# Patient Record
Sex: Female | Born: 1935
Health system: Southern US, Community
[De-identification: ages and names within clinical notes are randomized; demographics above are authoritative.]

## PROBLEM LIST (undated history)

## (undated) DIAGNOSIS — I6990 Unspecified sequelae of unspecified cerebrovascular disease: Secondary | ICD-10-CM

## (undated) DIAGNOSIS — F419 Anxiety disorder, unspecified: Secondary | ICD-10-CM

## (undated) DIAGNOSIS — E876 Hypokalemia: Secondary | ICD-10-CM

## (undated) DIAGNOSIS — K560 Paralytic ileus: Secondary | ICD-10-CM

## (undated) DIAGNOSIS — E785 Hyperlipidemia, unspecified: Secondary | ICD-10-CM

## (undated) DIAGNOSIS — N390 Urinary tract infection, site not specified: Secondary | ICD-10-CM

## (undated) DIAGNOSIS — I2510 Atherosclerotic heart disease of native coronary artery without angina pectoris: Secondary | ICD-10-CM

## (undated) DIAGNOSIS — D649 Anemia, unspecified: Secondary | ICD-10-CM

## (undated) DIAGNOSIS — E039 Hypothyroidism, unspecified: Secondary | ICD-10-CM

## (undated) DIAGNOSIS — I10 Essential (primary) hypertension: Secondary | ICD-10-CM

## (undated) DIAGNOSIS — D72829 Elevated white blood cell count, unspecified: Secondary | ICD-10-CM

## (undated) DIAGNOSIS — J189 Pneumonia, unspecified organism: Secondary | ICD-10-CM

## (undated) DIAGNOSIS — I219 Acute myocardial infarction, unspecified: Secondary | ICD-10-CM

## (undated) DIAGNOSIS — F329 Major depressive disorder, single episode, unspecified: Secondary | ICD-10-CM

## (undated) DIAGNOSIS — I639 Cerebral infarction, unspecified: Secondary | ICD-10-CM

## (undated) DIAGNOSIS — J90 Pleural effusion, not elsewhere classified: Secondary | ICD-10-CM

## (undated) DIAGNOSIS — J449 Chronic obstructive pulmonary disease, unspecified: Secondary | ICD-10-CM

## (undated) DIAGNOSIS — K219 Gastro-esophageal reflux disease without esophagitis: Secondary | ICD-10-CM

## (undated) DIAGNOSIS — F32A Depression, unspecified: Secondary | ICD-10-CM

## (undated) HISTORY — PX: BACK SURGERY: SHX140

---

## 1998-02-01 ENCOUNTER — Other Ambulatory Visit: Admission: RE | Admit: 1998-02-01 | Discharge: 1998-02-01 | Payer: Self-pay | Admitting: *Deleted

## 1998-07-30 ENCOUNTER — Ambulatory Visit (HOSPITAL_COMMUNITY): Admission: RE | Admit: 1998-07-30 | Discharge: 1998-07-30 | Payer: Self-pay | Admitting: Family Medicine

## 1998-11-01 ENCOUNTER — Encounter: Payer: Self-pay | Admitting: Ophthalmology

## 1998-11-02 ENCOUNTER — Ambulatory Visit (HOSPITAL_COMMUNITY): Admission: RE | Admit: 1998-11-02 | Discharge: 1998-11-02 | Payer: Self-pay | Admitting: Ophthalmology

## 1998-11-02 ENCOUNTER — Observation Stay (HOSPITAL_COMMUNITY): Admission: EM | Admit: 1998-11-02 | Discharge: 1998-11-03 | Payer: Self-pay | Admitting: Ophthalmology

## 1999-08-17 ENCOUNTER — Emergency Department (HOSPITAL_COMMUNITY): Admission: EM | Admit: 1999-08-17 | Discharge: 1999-08-17 | Payer: Self-pay | Admitting: Emergency Medicine

## 1999-08-17 ENCOUNTER — Encounter: Payer: Self-pay | Admitting: Emergency Medicine

## 1999-09-06 ENCOUNTER — Encounter: Payer: Self-pay | Admitting: Family Medicine

## 1999-09-06 ENCOUNTER — Ambulatory Visit (HOSPITAL_COMMUNITY): Admission: RE | Admit: 1999-09-06 | Discharge: 1999-09-06 | Payer: Self-pay | Admitting: Family Medicine

## 1999-11-01 ENCOUNTER — Encounter: Payer: Self-pay | Admitting: Neurosurgery

## 1999-11-05 ENCOUNTER — Encounter: Payer: Self-pay | Admitting: Neurosurgery

## 1999-11-05 ENCOUNTER — Inpatient Hospital Stay (HOSPITAL_COMMUNITY): Admission: RE | Admit: 1999-11-05 | Discharge: 1999-11-13 | Payer: Self-pay | Admitting: Neurosurgery

## 1999-11-13 ENCOUNTER — Inpatient Hospital Stay (HOSPITAL_COMMUNITY)
Admission: RE | Admit: 1999-11-13 | Discharge: 1999-11-21 | Payer: Self-pay | Admitting: Physical Medicine & Rehabilitation

## 1999-12-26 ENCOUNTER — Encounter
Admission: RE | Admit: 1999-12-26 | Discharge: 2000-01-21 | Payer: Self-pay | Admitting: Physical Medicine & Rehabilitation

## 2000-05-21 ENCOUNTER — Other Ambulatory Visit: Admission: RE | Admit: 2000-05-21 | Discharge: 2000-05-21 | Payer: Self-pay | Admitting: Family Medicine

## 2000-07-30 ENCOUNTER — Ambulatory Visit (HOSPITAL_COMMUNITY): Admission: RE | Admit: 2000-07-30 | Discharge: 2000-07-30 | Payer: Self-pay | Admitting: Gastroenterology

## 2000-07-30 ENCOUNTER — Encounter: Payer: Self-pay | Admitting: Gastroenterology

## 2000-08-27 ENCOUNTER — Ambulatory Visit (HOSPITAL_COMMUNITY): Admission: RE | Admit: 2000-08-27 | Discharge: 2000-08-27 | Payer: Self-pay | Admitting: Gastroenterology

## 2000-08-27 ENCOUNTER — Encounter: Payer: Self-pay | Admitting: Gastroenterology

## 2000-08-27 ENCOUNTER — Encounter (INDEPENDENT_AMBULATORY_CARE_PROVIDER_SITE_OTHER): Payer: Self-pay | Admitting: Specialist

## 2000-09-29 ENCOUNTER — Encounter: Payer: Self-pay | Admitting: Neurosurgery

## 2000-09-29 ENCOUNTER — Ambulatory Visit (HOSPITAL_COMMUNITY): Admission: RE | Admit: 2000-09-29 | Discharge: 2000-09-29 | Payer: Self-pay | Admitting: Neurosurgery

## 2000-10-01 ENCOUNTER — Encounter: Payer: Self-pay | Admitting: Gastroenterology

## 2000-10-01 ENCOUNTER — Ambulatory Visit (HOSPITAL_COMMUNITY): Admission: RE | Admit: 2000-10-01 | Discharge: 2000-10-01 | Payer: Self-pay | Admitting: Gastroenterology

## 2002-07-11 ENCOUNTER — Emergency Department (HOSPITAL_COMMUNITY): Admission: EM | Admit: 2002-07-11 | Discharge: 2002-07-12 | Payer: Self-pay | Admitting: Emergency Medicine

## 2002-07-11 ENCOUNTER — Encounter: Payer: Self-pay | Admitting: Emergency Medicine

## 2002-11-01 ENCOUNTER — Encounter: Payer: Self-pay | Admitting: Emergency Medicine

## 2002-11-01 ENCOUNTER — Inpatient Hospital Stay (HOSPITAL_COMMUNITY): Admission: EM | Admit: 2002-11-01 | Discharge: 2002-11-02 | Payer: Self-pay | Admitting: Emergency Medicine

## 2002-11-02 ENCOUNTER — Encounter (INDEPENDENT_AMBULATORY_CARE_PROVIDER_SITE_OTHER): Payer: Self-pay | Admitting: Cardiology

## 2002-12-09 ENCOUNTER — Other Ambulatory Visit: Admission: RE | Admit: 2002-12-09 | Discharge: 2002-12-09 | Payer: Self-pay | Admitting: Family Medicine

## 2004-04-26 ENCOUNTER — Ambulatory Visit (HOSPITAL_COMMUNITY): Admission: RE | Admit: 2004-04-26 | Discharge: 2004-04-27 | Payer: Self-pay | Admitting: Ophthalmology

## 2004-06-11 ENCOUNTER — Ambulatory Visit: Payer: Self-pay | Admitting: Cardiology

## 2004-08-27 ENCOUNTER — Emergency Department (HOSPITAL_COMMUNITY): Admission: EM | Admit: 2004-08-27 | Discharge: 2004-08-27 | Payer: Self-pay | Admitting: Emergency Medicine

## 2006-07-12 ENCOUNTER — Emergency Department (HOSPITAL_COMMUNITY): Admission: EM | Admit: 2006-07-12 | Discharge: 2006-07-12 | Payer: Self-pay | Admitting: Emergency Medicine

## 2007-04-12 ENCOUNTER — Inpatient Hospital Stay (HOSPITAL_COMMUNITY): Admission: EM | Admit: 2007-04-12 | Discharge: 2007-04-22 | Payer: Self-pay | Admitting: Emergency Medicine

## 2007-04-12 ENCOUNTER — Encounter: Payer: Self-pay | Admitting: Emergency Medicine

## 2007-04-12 ENCOUNTER — Ambulatory Visit: Payer: Self-pay | Admitting: Cardiology

## 2007-04-13 ENCOUNTER — Encounter (INDEPENDENT_AMBULATORY_CARE_PROVIDER_SITE_OTHER): Payer: Self-pay | Admitting: Internal Medicine

## 2007-04-14 ENCOUNTER — Encounter (INDEPENDENT_AMBULATORY_CARE_PROVIDER_SITE_OTHER): Payer: Self-pay | Admitting: Internal Medicine

## 2007-04-14 ENCOUNTER — Ambulatory Visit: Payer: Self-pay | Admitting: Vascular Surgery

## 2008-09-08 ENCOUNTER — Ambulatory Visit: Payer: Self-pay | Admitting: Gastroenterology

## 2008-09-08 ENCOUNTER — Ambulatory Visit: Payer: Self-pay | Admitting: *Deleted

## 2008-09-08 ENCOUNTER — Inpatient Hospital Stay (HOSPITAL_COMMUNITY): Admission: EM | Admit: 2008-09-08 | Discharge: 2008-09-11 | Payer: Self-pay | Admitting: Emergency Medicine

## 2010-07-01 LAB — BASIC METABOLIC PANEL
BUN: 6 mg/dL (ref 6–23)
BUN: 6 mg/dL (ref 6–23)
CO2: 31 mEq/L (ref 19–32)
Calcium: 8.3 mg/dL — ABNORMAL LOW (ref 8.4–10.5)
Calcium: 8.5 mg/dL (ref 8.4–10.5)
Chloride: 102 mEq/L (ref 96–112)
Creatinine, Ser: 0.91 mg/dL (ref 0.4–1.2)
GFR calc Af Amer: >60 mL/min (ref >60)
GFR calc Af Amer: >60 mL/min (ref >60)
GFR calc non Af Amer: 53 mL/min — ABNORMAL LOW (ref >60)
Glucose, Bld: 101 mg/dL — ABNORMAL HIGH (ref 70–99)
Potassium: 3.9 mEq/L (ref 3.5–5.1)
Sodium: 134 mEq/L — ABNORMAL LOW (ref 135–145)
Sodium: 139 mEq/L (ref 135–145)

## 2010-07-01 LAB — LIPID PANEL
Cholesterol: 127 mg/dL (ref 0–200)
Total CHOL/HDL Ratio: 3.7 RATIO

## 2010-07-01 LAB — PROTIME-INR
INR: 1 (ref 0.00–1.49)
Prothrombin Time: 13.9 seconds (ref 11.6–15.2)

## 2010-07-01 LAB — T4, FREE: Free T4: 1.11 ng/dL (ref 0.80–1.80)

## 2010-07-01 LAB — TSH: TSH: 3.143 u[IU]/mL (ref 0.350–4.500)

## 2010-07-01 LAB — CBC
HCT: 36.5 % (ref 36.0–46.0)
Hemoglobin: 12.3 g/dL (ref 12.0–15.0)
Hemoglobin: 13.5 g/dL (ref 12.0–15.0)
MCHC: 33.1 g/dL (ref 30.0–36.0)
MCHC: 33.7 g/dL (ref 30.0–36.0)
MCV: 93.7 fL (ref 78.0–100.0)
RBC: 3.85 MIL/uL — ABNORMAL LOW (ref 3.87–5.11)
RBC: 3.9 MIL/uL (ref 3.87–5.11)
RBC: 4.38 MIL/uL (ref 3.87–5.11)
RDW: 14.4 % (ref 11.5–15.5)
WBC: 6.3 10*3/uL (ref 4.0–10.5)
WBC: 8.5 10*3/uL (ref 4.0–10.5)

## 2010-07-01 LAB — COMPREHENSIVE METABOLIC PANEL
ALT: 22 U/L (ref 0–35)
Alkaline Phosphatase: 67 U/L (ref 39–117)
BUN: 13 mg/dL (ref 6–23)
CO2: 30 mEq/L (ref 19–32)
GFR calc non Af Amer: 45 mL/min — ABNORMAL LOW (ref >60)
Glucose, Bld: 63 mg/dL — ABNORMAL LOW (ref 70–99)
Potassium: 4.3 mEq/L (ref 3.5–5.1)
Sodium: 135 mEq/L (ref 135–145)
Total Bilirubin: 0.7 mg/dL (ref 0.3–1.2)

## 2010-07-01 LAB — GLUCOSE, CAPILLARY
Glucose-Capillary: 149 mg/dL — ABNORMAL HIGH (ref 70–99)
Glucose-Capillary: 173 mg/dL — ABNORMAL HIGH (ref 70–99)
Glucose-Capillary: 212 mg/dL — ABNORMAL HIGH (ref 70–99)
Glucose-Capillary: 255 mg/dL — ABNORMAL HIGH (ref 70–99)
Glucose-Capillary: 55 mg/dL — ABNORMAL LOW (ref 70–99)
Glucose-Capillary: 85 mg/dL (ref 70–99)
Glucose-Capillary: 97 mg/dL (ref 70–99)
Glucose-Capillary: 99 mg/dL (ref 70–99)

## 2010-07-01 LAB — DIFFERENTIAL
Basophils Absolute: 0 10*3/uL (ref 0.0–0.1)
Basophils Relative: 1 % (ref 0–1)
Eosinophils Absolute: 0.2 10*3/uL (ref 0.0–0.7)
Eosinophils Relative: 2 % (ref 0–5)
Neutrophils Relative %: 64 % (ref 43–77)

## 2010-08-06 NOTE — Consult Note (Signed)
[name], [name] NO.:  [id number]   MEDICAL RECORD NO.:  [id number]          PATIENT TYPE:  INP   LOCATION:  2902                         FACILITY:  [facility]   PHYSICIAN:  Arturo Morton. Riley Kill, MD, FACCDATE OF BIRTH:  Jul 17, 1918   DATE OF CONSULTATION:  04/14/2007  DATE OF DISCHARGE:                                 CONSULTATION   CHIEF COMPLAINT:  The patient cannot provide a chief complaint.   REASON FOR THE CONSULTATION:  Elevated troponins.   HISTORY OF PRESENT ILLNESS:  [name] is an 77 year old with no known  history of coronary artery disease.  Her past medical history does  include diabetes, cerebrovascular disease, gastroesophageal reflux and  hypertension.  She presented to the emergency room with nausea and  vomiting and abdominal pain.  She was first seen in [facility].  CT  revealed small bowel obstruction and possibly left lower lobe pneumonia  with pleural effusion.  She was placed on Levaquin and Biaxin.  Chest x-  ray here was read as right lower lobe pneumonia and bilateral pleural  effusions.  Since admission she has been followed by the [facility].  She denies any chest pain or pain in the past.  She has been a  little bit short of breath but she denies fevers.  She says that she has  had stuffing of the left nostril.   ALLERGIES:  NO KNOWN DRUG ALLERGIES.   MEDICATIONS:  1. Amlodipine 5 mg daily.  2. Diovan/HCT 160/25 daily.  3. KCl 20 mEq daily.  4. Omeprazole 20 mg daily.  5. Ecotrin 325 mg daily.  6. Baclofen 500 mg b.i.d.  7. Lantus 17 units subcu a.m.  8. Glucovance 5/500 b.i.d.   In the hospital, she is receiving aspirin 300 mg daily, metoprolol 2.5  mg IV q.8h., Lantus 10 units q.h.s., NovoLog, hydralazine 5 b.i.d. and  Protonix 40 mg daily.   PAST MEDICAL HISTORY:  Remarkable for CVA in the past.  She has had  diabetes and gastroesophageal reflux. She has hypertension.   The patient lives in [place] in  [place].  She is retired.  She is  widowed and has three children.  She has not been a smoker and does not  abuse drugs.   FAMILY HISTORY:  Mother died at 9 of gallbladder surgery.  Her father  died at 104 of myocardial infarction.  She has had five brothers several  of whom have had myocardial infarctions.   REVIEW OF SYSTEMS:  Negative for fevers, chills, sweats.  She has had  some nausea and vomiting.  There has also been some shortness of breath.  There has been no obvious dysuria.  She has had a fair amount of  anxiety.  She is hard of hearing but the overall review of systems for 8  points is otherwise negative.   PHYSICAL EXAMINATION:  She is alert and we are able to get her respond.  Temperature is 98.5, the blood pressure is 156/49 and pulse is 105,  respiratory rate is 18.  Her SAO2 sat is 95% on 2  liters.  She is  elderly and appears chronically ill.  HEENT:  The extraocular muscles are intact and pupils were equal.  There  is some arcus senilis.  The jugular veins were not distended.  Carotid  upstrokes were present.  There was no obvious bruit.  LUNGS:  Fields reveal decreased breath sounds particularly in the bases  and the patient could not sit forward.  Anteriorly they were clear.  The  PMI was not displaced.  There was a regular rate and rhythm, but it was  modestly fast.  There was a minimal 1/6 systolic ejection murmur noted  at the left sternal edge without obvious transmission.  No diastolic  murmurs were appreciated.  There are no obvious bruits in the carotid  area.  ABDOMEN:  Revealed somewhat protuberant.  There were slight bowel sounds  noted in all quadrants.  There was no definite rebound.  Because of the  protuberance it was hard to feel either liver or spleen.  EXTREMITIES:  Reveal no obvious evidence of DVT.  There is no edema  obvious.  MUSCULOSKELETAL:  Remarkable for degenerative changes in the distal  digits.  NEUROLOGIC:  Nonfocal at the present  time.   Chest x-ray April 12, 2007 suspects right lower lobe pneumonia with  bilateral effusions.  CT of the pelvis reveals suggestive of a small-  bowel obstruction involving the ileum and uterine fibroids. A chest x-  ray April 13, 2007 suggests increase consolidation or pleural fluid.   BUN is 29, creatinine 1.17.  White count is 19,400.  Sodium is 130.  TSH  is 5.4, D-dimer is not obtained.  BNP is 173.  Troponins on three  separate occasions are 0.24, 0.22 and 0.22 and 0.25.   Blood cultures are currently negative.   Hemoglobin is 11.4.   IMPRESSION:  1. Elevated troponins could be possibly due to demand ischemia in a      patient with diabetes and marked increase in demand associated with      underlying coronary disease.  One other possibility would be      consideration of elevated troponins from possible pulmonary      embolus.  This seems less likely as the patient has had no acute      change in status, no chest pain and no increasing shortness of      breath or marked hypoxemia beyond what would be expected in her      current situation.  2. Probable lower lobe pneumonia with superimposed effusions.  3. Hypertension.  4. Diabetes mellitus on insulin at home.  5. Small bowel obstruction.  6. Reduction in GFR.   RECOMMENDATIONS:  1. I would get venous Dopplers to exclude DVT.  She is not a good      candidate for CT scanning or VQ scanning.  The VQ scan would likely      be indeterminate and the CT would be increased due to contrast      load.  In the absence of Doppler DVT one might consider low dose      enoxaparin.  2. I would increase the metoprolol to 2.5 mg q.6h. and increase as      tolerated.  3. I agree with use of low-dose aspirin at the present time.  4. We will continue to follow her with you.   ADDENDUM:  Echocardiogram does not reveal enlarged RV.  The LV function  is normal.      Arturo Morton. Riley Kill, MD,  [facility]  Electronically Signed      TDS/MEDQ  D:  04/14/2007  T:  04/14/2007  Job:  [id number]

## 2010-08-06 NOTE — H&P (Signed)
NAMELAJOY, VANAMBURG NO.:  [id number]   MEDICAL RECORD NO.:  [id number]          PATIENT TYPE:  INP   LOCATION:  2902                         FACILITY:  [facility]   PHYSICIAN:  Herbie Saxon, MDDATE OF BIRTH:  Sep 16, 1918   DATE OF ADMISSION:  04/12/2007  DATE OF DISCHARGE:                              HISTORY & PHYSICAL   PRIMARY CARE PHYSICIAN:  Unassigned.   She is a do not resuscitate.   PRESENTING COMPLAINT:  Nausea, vomiting and abdominal pain three days.  Confusion one day.   HISTORY OF PRESENTING COMPLAINTS:  This is an 77 year old Caucasian  lady, who is a nursing home resident. She had been noticed by the  daughter to have poor oral intake, nausea, vomiting, constipation and  mild right upper abdominal pain, which is improving, progressively  getting worse. The patient also has a mild cough, minimally productive,  no fever, however, the patient was noted to be increasingly more  confused, nd experiencing more abdominal pain and distress.  At present,  the patient is weak,  not volunteering much information. She was  initially seen at [facility] Emergency Room where the CT of the abdomen  however, did reveal small bowel obstruction. There is a left lower lobe  pneumonia and pleural effusion  on CXR.  She had been placed on Levaquin  and Biaxin treatment for the urinary tract infection at the nursing  home.   PAST MEDICAL HISTORY:  Cerebrovascular accident, diabetes mellitus,  gastroesophageal reflux disease, hypertension, urinary tract infection.   FAMILY HISTORY:  Noncontributory.   SOCIAL HISTORY:  She has three children. No history of alcohol, tobacco  or illicit drug use.   PAST SURGICAL HISTORY:  Back laminectomy.   REVIEW OF SYSTEMS:  Not available, because of the patient's obtunded  state.   ALLERGIES:  No known drug allergies.   MEDICATIONS:  Amlodipine 5 mg daily, Diovan HCTZ 160/25 daily, potassium  chloride 20  milliequivalents daily, Omeprazole 20 mg daily, Ecotrin 225  mg daily, Baclofen 500 mg b.i.d., Lantus 17 units subcu q.a.m.,  Glucovance 5/500 mg b.i.d.   PHYSICAL EXAMINATION:  GENERAL: On examination, she is an elderly female  in no acute distress, not tachypneic. She is  not pale or jaundice.  NECK: Is supple. Oropharynx and pharynx are clear.  HEAD: Is atraumatic. She is dehydrated.  CHEST: She has reduced breath sounds bibasally with coarse breath sounds  more marked in the left hemithorax.  ABDOMEN: Is distended. Mild tenderness in the right upper quadrant.  Bowel sounds are hypoactive. She has borborygmi.  She is arousable. She  is hard of hearing, confused.  PULSES: Peripheral pulses are present, no edema.   LABS:  Available labs showed that the sodium is 123,  potassium is 4.6,  chloride 89, bicarbonate 25, glucose is 340.  BUN is 25, creatinine is  1.4. WBC is 18.9. Hemoglobin is 12, hematocrit 36, platelet 222.  Urinalysis shows many bacteria. WBC is 11 to 20.   ASSESSMENT:  Altered mental status, sepsis with pneumonia, UTI,  leukocytosis, uncontrolled diabetes, partial small bowel obstruction,  bilateral pleural  effusion moderately severe, hypertension, severe  hyponatremia and renal insufficiency. The patient is to be admitted into  the intensive care unit. Will get a stat surgical evaluation, n.p.o., IV  fluids 80 ml per hour, watch her for fluid overload, restrict her,  monitor. Will repeat abdominal x-ray and chest x-ray in the morning.  Will start on IV Zosyn and Avelox after obtaining blood cultures and  urine cultures.  She has been on sliding scale insulin NovoLog with Accu-  check q.4 hourly while she is n.p.o. and will continue with Lantus 10  units subcu q.h.s. Will hold off on diuretics and Diovan for now,  Hydralazine 5 mg IV q.12 hours, continue Protonix 40 mg IV daily, hold  off with aspirin,  DVT prophylaxis. Phenergan, alt. with  Reglan IV q.8  hours   p.r.n. for nausea and Dilaudid 1 mg IV q.4 hour p.r.n. for pain.  Will follow Surgical recommendations. Will obtain hemoccult ABG, urine,  electrolytes and serum osmolality and review complete blood count and  chemistry, chest x-ray in the a.m.  Note that she is a do not  resuscitate.  Get speech therapy evaluation when the patient is more  stable.      Herbie Saxon, MD  Electronically Signed     MIO/MEDQ  D:  04/12/2007  T:  04/13/2007  Job:  [id number]

## 2010-08-06 NOTE — Consult Note (Signed)
[name], MALECKI NO.:  [id number]   MEDICAL RECORD NO.:  [id number]          PATIENT TYPE:  INP   LOCATION:  08/19/2900                         FACILITY:  [facility]   PHYSICIAN:  Sandria Bales. Ezzard Standing, M.D.  DATE OF BIRTH:  05-Sep-1918   DATE OF CONSULTATION:  04/12/2007  DATE OF DISCHARGE:                                 CONSULTATION   CONSULTATION NOTE:   DATE:  April 12, 2007   CONSULTATION PHYSICIAN:  Dr. Festus Barren.   REASON FOR CONSULTATION:  Bowel obstruction.   HISTORY OF PRESENT ILLNESS:  This is an 77 year old white female who is  at [facility] in [place] and her daughter, Criss Alvine, is at the bedside and gave the majority of the patient's  history.   The family is looking for a new primary care doctor for [name].  Ms.  [name] is unsure of who [name]'s primary doctor is at [facility].  [name] moved to [facility] April 2009 and the family has been  pleased with [place].  [name] husband died in a approximately  Aug 19, 2004 and she has actually enjoyed the nursing home and the social  stimulation since April 2008.  However, she is essentially wheelchair  bound.  Her daughter says she can get up and take a few steps with a  significant amount of assistance but usually scoots around in her  wheelchair.  [name] attributes this lack of activity some to the  stroke.  She had a stroke about 47.  And when [name]  first moved  to [place] her daughter thought she may have had some more neurologic  problems.  She thought she may have been in the hospital but I could  find no hospital records for April 2008 at [facility].   She has been doing well at [facility] until about 2 or 3 weeks ago when  she developed some sinus problems or complaints and last Saturday, which  would be the 11th of January, 2009, when her family visited her they  noticed she was increasingly congested and then this past Saturday, the  17th of January, she was not only congested but complaining of being  sick on her stomach and feeling poorly.  She was taken to the [facility]  Emergency Room this afternoon, April 12, 2007, and transferred to the  [facility] ER for evaluation by the Hospitalists Service.   A CT scan was obtained at [facility] which raised the question of a  bowel obstruction and I was asked to see [name] for this.  She also  had x-rays that showed right lower lobe consolidation consistent with a  right lower lobe pneumonia.   ALLERGIES:  She has no allergies.   CURRENT MEDICATIONS:  1. Amlodipine 5 mg daily.  2. Diovan HCT 160/25 daily.  3. Potassium daily.  4. Omeprazole 20 mg daily.  5. Ecotrin 325 mg once a day.  6. Biaxin 500 mg b.i.d.  7. Lantus 10 units every morning.  8. Glucovance 550 b.i.d.   REVIEW OF SYSTEMS:  NEUROLOGICAL:  According to  her daughter, [name]  had her first stroke by 1988 but recovered from this event.  Her  daughter thinks [name] had some either light strokes or TIAs in  April 2008 about the time she moved to [place]. Again, the daughter  raised the question of whether she was hospitalized for that, but I  could find no records in the chart that we have access to.  She says her  mother does get somewhat confused at times. She is very hard of hearing  but otherwise seems to be getting along well.  PULMONARY: Her daughter is really unaware that she had pneumonia until  she was seen in the emergency room  today, but apparently [name] had  been on antibiotics for either pneumonia or urinary tract infection at  [facility].  CARDIAC:  Ms. Harwood has hypertension.  She has had no cardiac  catheterization or chest pain.  GASTROINTESTINAL:  She has no history of stomach disease, liver disease.  The daughter thinks she has known gallstones which have never bothered  her.  She has had no prior colon problems.  She has never had any  abdominal surgery either from  a general surgery or GYN standpoint.  On  her abdomen, she has no abdominal scars.  UROLOGIC: No history of kidney stones.  There is a question of whether  she has been treated for a recent  UTI.   [name], who is here, is her one daughter and [name] has two  older sons.  [name] husband died again about 3 years ago.   PHYSICAL EXAMINATION:  VITAL SIGNS:  Temperature 98.7.  Blood pressure  189/71.  Pulse 120.  Respirations  28.  GENERAL:  She is a well nourished older white female who actually kept  her eyes closed most of the examination but she did interact some.  She  is hard of hearing.  HEENT:  Otherwise unremarkable.  NECK:  Supple.  No masses or thyromegaly.  LUNGS:  Clear to auscultation.  She had some crackles in the right base.  HEART:  Regular rate and rhythm.  ABDOMEN:  Moderately distended.  She has active bowel sounds.  She has  no tenderness.  No hernia.  No mass.  I can find no scars.  She has no  evidence of a hernia.  RECTAL:  I did not do a rectal examination on her.  EXTREMITIES:  She moves upper and lower extremities but I did not try to  get her out of bed to see how much she could move on her own.   LABORATORY DATA:  Sodium 123, potassium 4.6, chloride 189, CO2 of 25,  glucose 340, BUN 25, creatinine 1.45.  White blood count 18,900,  hemoglobin 12, hematocrit 36.  Urinalysis shows 10 to 20 white blood  cells with many bacteria.   I reviewed her CT scan with  _________.  On chest x-ray she does have  consolidation of the right lower lobe consistent with pneumonia.  She  has a few gallstones but no inflammation in her gallbladder.  She does  have some dilated small bowel with distal small bowel decompressed, but  it is unclear to me whether this is ileus or a bowel obstruction.  She  has no obvious mass.  No evidence of any acute inflammation of the small  bowel.   IMPRESSION:  1. Small bowel obstruction versus ileus.  Certainly she has  multiple medical problems which could lead to a  small  bowel ileus.  This could be attributed to her pneumonia and her  electrolyte disturbances.  At this time, we plan to keep her NPO,  hydrate her with IV fluids, treat her pnuemonia, and repeat a KUB to see  if any of the contrast she is given gets through to her colon which  argue a much more strongly in favor of ileus against a small-bowel  obstruction.  1. Right lower lobe pneumonia.  She has been written for antibiotics.  2. Diabetes with a marked hyperglycemia right now.  3. History of strokes, TIAs which has left her  wheelchair bound.  4. Mildly elevated creatinine of 1.45.  5. Hypertension.  6. Hyponatremia.  7. Hearing impaired  8. Assymptomatic gall stones.      Sandria Bales. Ezzard Standing, M.D.  Electronically Signed     DHN/MEDQ  D:  04/13/2007  T:  04/13/2007  Job:  [id number]   cc:   Herbie Saxon, MD

## 2010-08-06 NOTE — Discharge Summary (Signed)
[name], [name] NO.:  [id number]   MEDICAL RECORD NO.:  [id number]          PATIENT TYPE:  INP   LOCATION:  3025                         FACILITY:  [facility]   PHYSICIAN:  Roberta Dickens, MD     DATE OF BIRTH:  1918/04/03   DATE OF ADMISSION:  09/08/2008  DATE OF DISCHARGE:  09/11/2008                               DISCHARGE SUMMARY   ATTENDING PHYSICIAN:  Roberta Dickens, MD.   DISCHARGE DIAGNOSES:  1. Dysphagia likely due to esophageal stricture or recent throat      Streptococcus infection.  2. Hypoglycemia likely due to not eating well with injection of      insulin.  3. Type 2 diabetes.  4. Hypertension.  5. Hypothyroidism.  6. Hyperlipidemia.  7. Gastroesophageal reflux disease.  8. Depression, anxiety.  9. History of myocardial infarction.  10.History of pneumonia.  11.History of cerebrovascular attack.  12.History of urinary tract infection.  13.Depression and anxiety.   DISCHARGE MEDICATIONS:  1. Aspirin 81 mg p.o. daily.  2. Lipitor 40 mg p.o. daily.  3. Klonopin 0.5 mg daily at bedtime.  4. Ambien 10 mg daily at bedtime.  5. Metformin 500 mg take one tab 500 mg b.i.d. with food.  6. Humalog 5 units subcutaneous injection at lunch time 15 minutes      before lunch.  7. Lantus 67 units subcutaneous injection at bedtime.  8. Lasix 20 mg p.o. daily.  9  Lopressor 75 mg p.o. b.i.d.  1. Vasotec 10 mg p.o. daily.  2. Avapro 300 mg p.o. daily.  3. Amlodipine 10 mg p.o. daily.  4. Protonix 40 mg p.o. daily.  5. Mucinex 600 mg p.o. b.i.d.  6. Synthroid 100 mcg p.o. daily except 125 mcg on Sundays.  7. Potassium chloride 20 mEq p.o. daily.  8. MiraLax 17 grams daily.  9. Tylenol 1000 mg three times a day p.r.n. for pain.  10.Paxil 40 mg p.o. daily.   DISPOSITION AND FOLLOW UP:  [name] was discharged from the hospital  on September 11, 2008 in stable and improved condition.  Her dysphagia has  significantly improved during the hospitalization  once we gave her  Protonix and softer food.  After discharge she needs to continue the  carb-modified diabetic diet with mechanical soft and discontinue her  Claritin.  There was no evidence of the significant esophageal stricture  or mass and we did not have dilation at this hospitalization.   CONSULTATION:  [name]. Roberta Howard, MD and [name]. Roberta Goodell, MD of  gastroenterology.   PROCEDURES PERFORMED:  1. Chest x-ray on September 08, 2008, shows stable cardiomegaly with mild      atelectasis at the left base, no acute cardiopulmonary disease.  2. Barium swallow on September 08, 2008, shows moderate esophageal      mortality.  No evidence for high-grade structural mass.   BRIEF ADMISSION HISTORY:  [name] is a 77 year old female with past  medical history of dysphagia due to esophageal stricture status post  three esophageal dilations in 2002, type 2 diabetes, hypertension,  hypothyroidism, GERD disease, and hyperlipidemia who came to  the ED from  a nursing home at [facility] presenting with dysphagia.  The patient had  the sensation of food stuck in her esophagus for about 2-3 months prior  to admission, but 3-4 days prior to admission she felt worsening swallow  for both fluid and solid food, but she had no diarrhea, fever, chills,  chest pain, shortness of breath or palpitations.  No dysuria, but she  had some mild cough with thick mucus from her mouth during the past  several days.  Before she was brought to the ED she was given 5 units of  insulin at the nursing home and she said she did not eat very well for  her lunch.  She also has positive strep throat and had been treated with  a course of 7 days of amoxicillin which finished 3 days prior to  admission.  The patient had no other complaints.  Denies smoking or drug  use.   PHYSICAL EXAMINATION ON ADMISSION:  VITALS:  Temperature 97, blood  pressure 121/43, pulse 61, respiration rate 22 and O2 SAT 91% on 2 L.  GENERAL:  The patient in no  acute distress.  ENT:  Dry mucosa.  NECK:  Supple.  No thyroid enlargement.  No JVD.  LUNGS:  Good air movement.  Bibasilar crackles.  No wheezing.  HEART:  Regular rate and rhythm.  No murmur.  ABDOMEN:  Soft and no distention or tenderness.  Bowel sounds positive.  EXTREMITIES:  No edema.  NEURO:  Alert and oriented x3.  Cranial nerves II-XII intact.   ADMISSION LABORATORY FINDINGS:  Sodium 135, potassium 4.3, chloride 99,  bicarb 30, BUN 13, creatinine 1.14, glucose 63.  CBC:  Hemoglobin of  13.5, white blood cell 8.5, platelets 235.  Liver function was normal.   HOSPITAL COURSE BY PROBLEM:  1. Dysphagia.  The patient has history of esophageal stricture status      post esophageal dilation and now presented with dysphagia for 3-4      days prior to admission.  After admission, we did a barium swallow      which showed no significant stricture or mass, but moderate      esophageal dysmotility. She also has a recent sore throat with      positive strep throat, so her dysphagia might be due to the      dysphagia dysmotility or throat infection, but esophageal stricture      or mass was less likely.  During the hospitalization we gave the      patient softer food and also Protonix, the patient's symptoms had      significantly improved on the second day. We also had GI consult      and they thought that there was no indication for esophageal      dilation at this hospitalization due to no significant stricture      and now her swallow back to her baseline. She will be discharged to      skilled nursing facility and to continue Protonix, but discontinue      Claritin.  2. Hypoglycemia likely due to poor appetite and also was given the      insulin on the admission date.  We had given the patient D50 in ED      and the patient's hypoglycemia had resolved quickly.  On discharge      her CBG blood sugar was 145, no hypolycemia episode during this      admission.  3.  Type 2 diabetes.  Her  blood sugar has been well-controlled with      sliding scale during the hospitalization and the patient's      hemoglobin A1c 7.4 and she will continue to take home medications      after discharge.  4. Hypertension.  During the hospitalization her blood pressure had      been fairly controlled after we held her meds on admission. On      discharge her blood pressure was 155/56 and on discharge we will      restart her home medication including Lopressor, Vasotec, Avapro      and amlodipine.  5. Hypothyroidism.  After admission we checked her TSH 3.143 and free      T4 1.11 which were all within normal limits.  So, the patient will      continue her home dosing of the Synthroid.  6. Hyperlipidemia.  Her LDL of 59, HDL 34, cholesterol 127,      triglyceride 122 and she will continue to take her Lipitor after      discharge.   DISCHARGE VITALS:  Temperature 98.4, blood pressure 155/56, heart rate  93, respiration rate 20, O2 SAT 96 on room air.   DISCHARGE LABS:  Hemoglobin 12.3, white blood cells 6.3, platelets 156.  Sodium 139.  BMET:  Sodium 139, potassium of 3.9, chloride 102, bicarb  29, BUN 6, creatinine 0.93, glucose 188, calcium 8.6.      Jackson Latino, MD  Electronically Signed      Roberta Dickens, MD     ZY/MEDQ  D:  09/11/2008  T:  09/11/2008  Job:  [id number]

## 2010-08-06 NOTE — Discharge Summary (Signed)
[name], [name]              ACCOUNT NO.:  [id number]   MEDICAL RECORD NO.:  [id number]          PATIENT TYPE:  INP   LOCATION:  6738                         FACILITY:  [facility]   PHYSICIAN:  Ladell Pier, M.D.   DATE OF BIRTH:  04/19/1918   DATE OF ADMISSION:  04/12/2007  DATE OF DISCHARGE:                               DISCHARGE SUMMARY   DISCHARGE DATE:  To be determined.   DISCHARGE DIAGNOSES:  1. Ileus/small bowel obstruction.  2. Hypothyroidism.  3. Right lower lobe pneumonia.  4. Leukocytosis.  5. Diabetes.  6. Hypertension.  7. Pleural effusion.  8. Hypokalemia.  9. Non-Q wave myocardial infarction.  10.Elevated D-dimer.  11.Renal insufficiency.  12.Urinary tract infection.  13.Nausea, vomiting, abdominal pain.  14.History of cerebrovascular accident.  15.Gastroesophageal reflux disease.  16.Do not resuscitate.  17.Hyponatremia.   DISCHARGE MEDICATIONS:  1. Metoprolol 50 mg b.i.d.  2. Avapro 300 mg daily.  3. Aspirin 325 mg daily.  4. Protonix 40 mg daily.  5. Avelox 400 mg daily, last dose April 21, 2007.  6. Amlodipine 10 mg daily.  7. Lantus 38 units q.h.s.  8. Klonopin 0.5 mg b.i.d. p.r.n.  9. MiraLax 17 gm in 240 cc water daily.  10.Synthroid 25 mcg daily.   FOLLOW UP APPOINTMENT:  The patient discharged back to [facility].   CONSULTANTS:  1. Cardiology.  2. General surgery.   PROCEDURES:  None.   HISTORY OF PRESENT ILLNESS:  The patient is an 77 year old white female  who resides at a nursing home.  She was noted by her daughter to have  poor p.o. intake with nausea, vomiting, constipation, and mild right  upper abdominal wall tenderness that has been getting progressively  worse.  The patient also has a mild cough, minimally productive.  No  fevers.  She was becoming increasingly more confused in the emergency  room.  She was seen initially at [facility] emergency room.  A CT of the  abdomen and pelvis revealed small bowel obstruction,  left lower lobe  pneumonia, pleural effusion on chest x-ray.  She was placed on Levaquin  and Biaxin treatment for a urinary tract infection at the nursing home  previously.  Please see admission note for remainder of history.   PAST MEDICAL HISTORY/FAMILY HISTORY/SOCIAL  HISTORY/MEDICATIONS/ALLERGIES/REVIEW OF SYSTEMS:  Per admission H&P.   PHYSICAL EXAMINATION ON DISCHARGE:  VITAL SIGNS:  Temperature 98.1,  pulse 67, respirations 20, blood pressure 115/67, pulse oximetry 92% on  2 liters.  HEENT:  Head is normocephalic and atraumatic.  Pupils are reactive to  light.  Throat without erythema.  CARDIOVASCULAR:  Regular rate and rhythm.  LUNGS:  Crackles in the bases.  ABDOMEN:  Soft, nontender.  Mildly distended.  Positive bowel sounds.  EXTREMITIES:  Without edema.   HOSPITAL COURSE:  1. Ileus/small bowel obstruction.  The patient was admitted to the      ICU.  She was made NPO.  General surgery was consulted.  General      surgery did not think that the patient has small bowel obstruction      because clinically her  abdomen was benign.  She had no nausea or      vomiting.  She was able to tolerate food.  She was started on      Claritin in advance and tolerated that well.  Her follow up x-ray      stated ileus versus small bowel obstruction, but clinically, the      patient has no symptoms of small bowel obstruction.  She has daily      bowel movements.  Tolerates all her meals without nausea.  Abdomen      is nontender and soft.  2. Hypothyroidism.  The patient complained of tingling in her lip and      fingers.  B-12, RBC, folate and thyroid function was checked, and      she was found to be hypothyroid.  She was put on replacement      therapy, Synthroid 25 mcg daily.  3. Right lower lobe pneumonia.  She was treated for her pneumonia with      10 days of antibiotic therapy.  4. Leukocytosis.  Will monitor.  5. Diabetes.  She was on metformin which was discontinued when the       patient had a dye study done, so we will just continue her on      Lantus inpatient, but she can be restarted on the metformin when      she goes back to the nursing home.  6. Hypertension.  The patient's blood pressure has been running      elevated.  Will continue to monitor her blood pressure.  Added back      Norvasc and increased it to 10 mg daily.  7. Pleural effusion.  Will follow up chest x-ray at a later date to      evaluate pleural effusion.  8. Hypokalemia.  Her potassium was replaced.  9. Non-Q wave myocardial infarction, most likely to a stress      situation.  Cardiology followed her inpatient, and she was      conservatively managed.  10.Elevated D-dimer.  She was noted to have an elevated D-dimer.  A      spiral CT of the chest was obtained.  It did not show any PE, just      moderate right pleural effusion.  11.Renal insufficiency.  The patient started off having renal      insufficiency initially in her hospital course, but that resolved      throughout the hospitalization.  12.Urinary tract infection.  She was treated for a urinary tract      infection.  13.Do not resuscitate.  14.One of four blood cultures were positive for Staph.  This was most      likely contaminant.   DISCHARGE LABORATORIES:  Free T4 of 0.63.  Folate 899.  Blood cultures  negative x2.  Sodium 131, potassium 4.9, chloride 102, CO2 of 26,  glucose 157, BUN 15, creatinine 0.78.  LDC 17.4, hemoglobin 10.7,  platelets 333.  B-12 596.  Blood culture, 1 of 4 had Staph.  KUB showed  ileus versus low-grade partial small bowel obstruction without  significant interval change and large calcified fibroid.      Ladell Pier, M.D.  Electronically Signed     NJ/MEDQ  D:  04/20/2007  T:  04/20/2007  Job:  [id number]

## 2010-08-09 NOTE — H&P (Signed)
[facility]  Patient:    [name], [name]                       MRN: [id number] Adm. Date:  11/05/99 Attending:  Payton Doughty, M.D.                         History and Physical  ADMISSION DIAGNOSES:  Spondylosis and neurogenic claudication.  HISTORY:  This is a very nice 77, soon to be 77 year old, right-handed white lady who since May has been having difficulty with her left leg.  Her back started bothering her and she had her left knee buckle up on her several times, falling a couple of time.  An MRI obtained demonstrated spinal stenosis.  She was referred to me.  PAST MEDICAL HISTORY: 1. Remarkable for hypertension. 2. Adult onset diabetes mellitus. 3. Hypothyroidism. 4. She has a history of a TIA in the past.  ALLERGIES:  She gets nervous with ASPIRIN and it upsets her stomach, but she is not allergic to it.  CURRENT MEDICATIONS:  1. Amaryl 4 mg b.i.d.  2. Metoprolol 50 mg q.d.  3. Hydrochlorothiazide 25 mg q.d.  4. Dipyridamole 50 mg t.i.d.  5. Levoxyl 0.5 mg one q.d.  6. Verapamil 240 mg 1-1/2 q.d. for hypertension.  7. Lipitor 10 mg q.d.  8. Bactrim one q.d. for urinary tract infection.  9. Lorazepam 0.5 mg b.i.d. on a p.r.n. basis. 10. Celebrex 200 mg b.i.d. for arthritis. 11. Avandia 4 mg q.d. for diabetes mellitus. 12. She uses Os-Cal. 13. Ocuvite. 14. Vitamin E. 15. Coated aspirin. 16. Senokot.  PAST SURGICAL HISTORY: 1. Cataract in 2000. 2. Tonsillectomy in approximately 1950.  SOCIAL HISTORY:  She neither smokes nor drinks.  She is retired.  FAMILY HISTORY:  Not as a given, other than her father having heart disease.  REVIEW OF SYSTEMS:  Remarkable for wearing glasses, cataracts, hearing aids, hearing loss, tinnitus, a balance disturbance, hypertension, hypercholesterolemia, swelling in her hands and feet, shortness of breath with exertion, urinary tract infections, leg weakness, back pain, leg pain, joint pain,  arthritis, difficulty with memory, difficulty with speech, inability to concentrate, diabetes mellitus, and thyroid disease.  PHYSICAL EXAMINATION:  HEENT:  Within normal limits.  NECK:  She has a reasonable range of motion.  CHEST:  Clear.  CARDIAC:  A regular rate and rhythm with no murmur at this time.  ABDOMEN:  Nontender.  No hepatosplenomegaly.  EXTREMITIES:  Without clubbing or cyanosis.  Peripheral pulses are good.  GENITOURINARY:  Deferred.  NEUROLOGIC:  She is awake, alert, oriented.  She has bilateral hearing loss. Other than that her cranial nerves appeared to function normally.  Motor examination showed 5/5 strength in the upper and lower extremities, save for hip flexors on the left and knee extensors on the left which are 5-/5.  Lower extremity sensation is intact.  She is areflexic in the upper and the lower extremities.  Hoffmans is negative.  Toes downgoing bilaterally.  Plain films did show spondylitic change at L5-S1, L4-L5, and spondylolisthesis of L4 on L5, and degenerative changes at L3-4 and L2-3.  An MRI showed severe spinal stenosis at L3-4 and L4-5, and there is moderate stenosis at L2-3.  CLINICAL IMPRESSION:  Left L2-3 radiculopathy, secondary to spinal stenosis and neurogenic claudication.  Given the fact that she continues to fall, I think the most recent thing to do is to decompress this.  I think a 77 year old with fusion is overkill, and a decompressive laminectomy may solve the problem for which she is here.  PLAN:  The plan is a total laminectomy at L3-4 with undercutting at L5 and undercutting at L2.  The risks and benefits of this approach have been discussed with her, and she wishes to proceed. DD:  11/05/99 TD:  11/05/99 Job: [id number]

## 2010-08-09 NOTE — Discharge Summary (Signed)
[name], [name]                        ACCOUNT NO.:  [id number]   MEDICAL RECORD NO.:  [id number]                   PATIENT TYPE:  INP   LOCATION:  3734                                 FACILITY:  [facility]   PHYSICIAN:  Corinna L. Lendell Caprice, MD             DATE OF BIRTH:  1918-10-22   DATE OF ADMISSION:  10/31/2002  DATE OF DISCHARGE:  11/02/2002                                 DISCHARGE SUMMARY   DIAGNOSES:  1. Chest pain secondary to gastroesophageal reflux disease.  2. Type 2 diabetes.  3. History of transient ischemic attack.  4. Hypertension.  5. Hypothyroidism.  6. Hyperlipidemia.  7. History of laminectomy.   DISCHARGE MEDICATIONS:  The same as upon admission with the addition of  Protonix 40 mg p.o. daily. Specifically she is to continue her Amaryl 2 mg  p.o. daily, Toprol XL 50 mg p.o. daily, hydrochlorothiazide 25 mg p.o.  daily, Persantine 50 mg p.o. q.i.d., Levoxyl 50 mcg p.o. daily, verapamil  240 mg p.o. daily, Lipitor 10 mg p.o. daily, Macrodantin 50 mg p.o. daily,  aspirin 325 mg p.o. daily, Celebrex 200 mg p.o. b.i.d., Miacalcin nasal  spray.   DIET:  Diabetic.   ACTIVITY:  Ad lib.   DISCHARGE FOLLOW UP:  Follow-up with her primary care physician in about a  month.   CONDITION ON DISCHARGE:  Stable.   PERTINENT LABORATORY DATA:  CPK, MB, and troponin negative x4. CBC normal.  Basic metabolic panel significant for bicarbonate of 33 and a glucose of  182, otherwise normal. Hemoglobin A1C was 6.9. HDL was 37, LDL 63,  triglycerides 220. TSH was 0.08.   SPECIAL STUDIES AND RADIOLOGY:  Echocardiogram showed a normal left  ventricular size and systolic function with no regional wall motion  abnormalities. Moderate asymmetric septal hypertrophy. EKG showed sinus  rhythm with PVCs, otherwise normal.   HOSPITAL COURSE:  [name] is a very pleasant and relatively spry 77-year-  old white female with multiple medical problems who presented to the  emergency room with atypical chest pressure and presyncope. This occurred  several times during her hospitalization, usually after a meal. The patient  has a history of hiatal hernia and apparently had been on Nexium previously,  but she ran out of this. The patient was admitted to telemetry where she  remained in normal sinus rhythm with PVCs. She ruled out for myocardial  infarction. Her other medications were  continued and her other medical problems remained stable. Given the very  good echocardiogram result I feel no stress test is indicated at this time  especially given the fact that the pains usually occur after eating it is  more likely to be a GI etiology.  Corinna L. Lendell Caprice, MD    CLS/MEDQ  D:  11/02/2002  T:  11/03/2002  Job:  [id number]   cc:   Ernestina Penna, M.D.  [address] [place]  [place] [address]  Fax: [phone]

## 2010-08-09 NOTE — Op Note (Signed)
NAMENIMCO, BIVENS NO.:  [id number]   MEDICAL RECORD NO.:  [id number]          PATIENT TYPE:  OIB   LOCATION:  5729                         FACILITY:  [facility]   PHYSICIAN:  Guadelupe Sabin, M.D.DATE OF BIRTH:  September 14, 1918   DATE OF PROCEDURE:  04/26/2004  DATE OF DISCHARGE:  04/27/2004                                 OPERATIVE REPORT   .   PREOPERATIVE DIAGNOSIS:  Nuclear cataract, right eye.   POSTOPERATIVE DIAGNOSIS:  Nuclear cataract, right eye.   OPERATION:  Planned extracapsular cataract extraction - phacoemulsification,  primary insertion of posterior chamber intraocular lens implant, right eye.   SURGEON:  Guadelupe Sabin, M.D.   ASSISTANT:  Nurse.   ANESTHESIA:  Local 4% Xylocaine, 0.75 Marcaine retrobulbar block with Wydase  added, topical tetracaine, intraocular Xylocaine.  Anesthesia standby  required in this 77 year old female.   DESCRIPTION OF PROCEDURE:  After the patient was prepped and draped, a lid  speculum was inserted in the right eye.  The eye was turned downward and a  superior rectus traction suture placed.  Schiotz tonometry was recorded  between 5-10 scale units with a 5.5 gram weight.  A peritomy was performed  adjacent to the limbus from the 11 to 1 o'clock position. The corneoscleral  junction was cleaned and a corneoscleral groove made with a 45 degree  Superblade.  The anterior chamber was then entered with a 2.5 mm diamond  keratome at the 12 o'clock position and the 15 degree blade at the 2:30  position.  Using a bent 26 gauge needle on an [place]oat syringe, a circular  capsulorrhexis was begun and then completed with the Grabow forceps.  Hydrodissection and hydrodelineation were performed using 1% Xylocaine.  The  30 degree phacoemulsification tip was then inserted with slow controlled  emulsification of the lens nucleus total ultrasonic time 1 minute 2 seconds,  average power level 20% total amount of fluid used 50 mL.   Following removal  of the nucleus, the residual cortex was aspirated with the irrigation-  aspiration tip.  The posterior capsule appeared intact with a brilliant red  fundus reflex.  It  was therefore elected to insert an [employer] medical  optics SI 40 NB silicone three-piece posterior chamber intraocular lens  implant, diopter strength +20.00.  This was inserted with the McDonald  forceps into the anterior chamber and then centered into the capsular bag  using the [facility] lens rotator.  The lens appeared to be well-centered.  The  Provisc and Ocucoat which had been used intermittently during the procedure  was  were aspirated replaced with balanced salt solution and Miochol  ophthalmic solution.  The operative incisions appeared to be self-sealing.  It was, however, elected to place a single 10-0 interrupted nylon suture  across the longer 12 o'clock incision to prevent leakage and  endophthalmitis.  Maxitrol ointment was instilled in the conjunctival cul-de-  sac and a light patch and protector shield applied.  Duration of procedure  and anesthesia administration of 45 minutes. The patient tolerated the  procedure well in general, left the operating room to  the recovery room in  good condition.      HNJ/MEDQ  D:  07/21/2004  T:  07/21/2004  Job:  [id number]

## 2010-08-09 NOTE — Op Note (Signed)
NAMEBRALEE, FELDT NO.:  [id number]   MEDICAL RECORD NO.:  [id number]          PATIENT TYPE:  OIB   LOCATION:  5729                         FACILITY:  [facility]   PHYSICIAN:  Guadelupe Sabin, M.D.DATE OF BIRTH:  1918/12/12   DATE OF PROCEDURE:  04/27/2004  DATE OF DISCHARGE:                                 OPERATIVE REPORT   PREOPERATIVE DIAGNOSES:  Senile nuclear cataract right eye, age-related dry  macular degeneration, right eye.   POSTOPERATIVE DIAGNOSIS:  Senile nuclear cataract right eye, age-related dry  macular degeneration, right eye.   DATE OF OPERATION:  Planned extracapsular cataract extraction -  phacoemulsification, primary insertion of posterior chamber intraocular lens  implant.   SURGEON:  Dr. Stormy Fabian.   ASSISTANT:  Nurse.   ANESTHESIA:  Local 4% Xylocaine, 0.75 Marcaine, retrobulbar block with  Wydase added, topical tetracaine, intraocular Xylocaine. Anesthesia standby  recovery required in this elderly, slightly deaf patient.   OPERATIVE PROCEDURE:  After the patient was prepped and draped, a lid  speculum was inserted in the right eye. The eye was turned downward and a  superior rectus traction suture placed.  Schiotz tonometry was recorded at 5-  7 scale units with a 5.5 g weight. A peritomy was performed adjacent to the  limbus from the 11 to the 1 o'clock position. The corneoscleral junction was  cleaned and a corneoscleral groove made with a 45 degrees super blade. The  anterior chamber was then entered with the 2.5 mm diamond keratome at the 12  o'clock position and a 15 degrees blade at the 2:30 position. Using a bent  26 gauge needle on an OcuCoat syringe, a circular capsular rhexis was begun  and then completed with the Grabow forceps. Hydrodissection and  hydrodelineation were performed using 1% Xylocaine. A 30 degrees  phacoemulsification tip was then inserted for slow. controlled  emulsification of the lens nucleus  with back cracking with the Bechert pick.  Total ultrasonic time 1 minute, 2 seconds, average power level 20%, total  amount of fluid used 50 cc.   Following removal of the nucleus, the residual cortex was aspirated with the  irrigation aspiration silicone tip probe. The posterior capsule appeared  intact with a brilliant red fundus reflex.  It was therefore elected to  insert an [employer] SI-40 NB silicone three-piece posterior  chamber intraocular lens implant, diopter strength +20.00. This was inserted  with the McDonald forceps into the anterior chamber and then centered into  the capsular bag using the [facility] lens rotator. The lens appeared to be  well-centered. The Provisc and OcuCoat which had been used intermittently  during the procedure was aspirated and replaced with balanced salt solution  and Miochol ophthalmic solution. The operative incisions appeared to be self-  sealing. It was however elected in this patient to place a single 10-0  interrupted nylon suture across the 12 o'clock  incision to ensure closure and to prevent endophthalmitis. Maxitrol ointment  was instilled in the conjunctival cul-de-sac and a light patch and protector  shield applied. Duration of procedure and anesthesia administration, 45  minutes. The patient tolerated the procedure well in general, left the  operating room to the recovery room in good condition.      HNJ/MEDQ  D:  04/27/2004  T:  04/28/2004  Job:  [id number]

## 2010-08-09 NOTE — Discharge Summary (Signed)
[place]. [facility]  Patient:    [name], [name]                     MRN: [id number] Adm. Date:  [id number] Disc. Date: 11/21/99 Attending:  Herold Harms Dictator:   Mcarthur Rossetti. Angiulli, P.A. CC:         Payton Doughty, M.D.  Monica Becton, M.D.   Discharge Summary  DISCHARGE DIAGNOSES: 1. Decompressive laminectomy, L3-4 for spinal stenosis on November 05, 1999. 2. Diabetes mellitus. 3. Hypertension. 4. Hypothyroidism. 5. History of transient ischemic attack. 6. Hyperlipidemia.  HISTORY OF PRESENT ILLNESS:  This 77 year old female admitted on November 05, 1999, with low back pain radiating to left lower extremity.  She denied any bowel or bladder disturbances.  Upon evaluation, MRI showed severe spinal stenosis at L3-4 and L4-5.  Underwent decompressive laminectomy of L3-4 with undercutting of L5 and L2 on November 05, 1999, per Dr. Channing Mutters.  Postoperative pain management.  Mild vertigo that resolved.  Ambulating supervision.  No chest pain or shortness of breath.  Blood sugars with some increased variables. Latest hemoglobin 12.2, hematocrit 35.6.  EKG - normal sinus rhythm.  Admitted for a comprehensive rehab program.  PAST MEDICAL HISTORY:  See discharge diagnoses.  PAST SURGICAL HISTORY: 1. Tonsillectomy. 2. Cataract surgery.  ALLERGIES:  No known drug allergies.  HABITS:  No alcohol or tobacco.  PRIMARY CARE PHYSICIAN:  Dr. Vernon Prey of [place], [place].  MEDICATIONS PRIOR TO ADMISSION:  1. Amaryl 4 mg twice daily.  2. Metoprolol 50 mg daily.  3. Hydrochlorothiazide 25 mg daily.  4. Persantine 50 mg three times daily.  5. Levoxyl 0.5 mg daily.  6. Verapamil 240 mg daily and 1/2 tablet in the p.m.  7. Lipitor 10 mg daily.  8. Lorazepam 0.5 mg twice daily as needed.  9. Celebrex twice daily. 10. Avandia 4 mg daily. 11. Os-Cal. 12. Aspirin.  SOCIAL HISTORY:  Lives with husband in [place].  Independent with a cane prior to  admission.  Two level home, three steps to entry.  The patient stays on the first floor.  Husband with limited assistance.  Family in area works.  HOSPITAL COURSE:  The patient did well while in rehabilitation services with therapies initiated on a b.i.d. basis.  The following issues were followed during patients rehab course:  Pertaining to Ms. Martins decompressive laminectomy, surgical site healing nicely, sutures have been removed, no signs of infection, neurovascular sensation remained intact.  Pain management ongoing with good results.  She is now using Vicodin on a limited basis for pain.  Diabetes mellitus with some increased variables.  It is questionable whether she is being compliant with her diet.  She was on Amaryl and Avandia prior to hospital admission, these were adjusted accordingly.  Blood sugars were 300 at times.  Her Amaryl was increased to 4 mg daily on November 20, 1999.  She would need followup with her primary Dr. Vernon Prey of [place].  Blood pressures remained controlled, no orthostatic changes.  She continued with her Lopressor and Verapamil as prior to hospital admission.  She continued on hormone replacement for her hypothyroidism.  She was treated for an urinary tract infection with Tequin for Serratia.  She remained afebrile.  She denied any dysuria.  She was voiding without difficulty.  She had a history of transient ischemic attack. She was on Persantine prior to hospital admission.  She would follow up with her primary MD  again concerning this.  There was some question that she was also on aspirin prior to hospital admission.  The patient had noted she had a possible allergy to aspirin, thus it was never resumed.  Overall, for her functional mobility, ambulating extended distances supervision, essentially independent standby assist in all areas of activities of daily living of dressing, grooming, and homemaking.  Overall, her strength and endurance greatly  improved.  She was encouraged of overall progress.  Discharged to home.  Latest hemoglobin 10.4, hematocrit 30.8, sodium 131, potassium 4.0, BUN 15, creatinine 0.6.  DISCHARGE MEDICATIONS:  1. Neurontin 300 mg at bedtime.  2. Amaryl 4 mg daily.  3. Tequin 400 mg daily x 3 more days.  4. Avandia 2 mg daily.  5. Ecotrin 325 mg daily.  6. Lopressor 25 mg 1/2 tablet twice daily.  7. Ativan 0.5 mg at bedtime.  8. Zocor 80 mg daily.  9. Verapamil SR 120 mg in the p.m. 240 mg daily. 10. Persantine 50 mg three times daily. 11. Hydrochlorothiazide 25 mg daily. 12. Paxil 10 mg daily. 13. Synthroid 50 mcg daily. 14. Vicodin as needed.  ACTIVITY:  As tolerated.  DIET:  An 1800 calorie ADA.  SPECIAL INSTRUCTIONS:  No driving.  Check blood sugars twice daily.  Call primary MD is less than 60 or greater than 250.  As noted above, her aspirin has now been resumed, as old records had been reviewed with her history of transient ischemic attack.  She would also remain on her Persantine.  She would follow up with Dr. Trey Sailors, neurosurgery as advised. DD:  11/20/99 TD:  11/20/99 Job: [id number]

## 2010-08-09 NOTE — H&P (Signed)
NAMEKIRSTAN, FENTRESS NO.:  [id number]   MEDICAL RECORD NO.:  [id number]          PATIENT TYPE:  OIB   LOCATION:  5729                         FACILITY:  [facility]   PHYSICIAN:  Guadelupe Sabin, M.D.DATE OF BIRTH:  [birth year]   DATE OF ADMISSION:  04/26/2004  DATE OF DISCHARGE:  04/27/2004                                HISTORY & PHYSICAL   This time. Digoxin dictating the outpatient admission note on the patient.  Arlana Hove are Patrcia Dolly, record number [id number] of outpatient  admission [phone] palpation notorious follows.   REASON FOR ADMISSION:  This was a planned outpatient readmission of this 10-  year-old white female admitted for cataract implant surgery of the right  eye.   HISTORY OF PRESENT ILLNESS:  This patient has been followed in my office for  progressive age-related macular degeneration and progressive cataract  formation and non-insulin dependent diabetes mellitus without diabetic  retinopathy. The patient has been previously admitted to this hospital on  November 02, 1998 for complicated cataract implant surgery of the left eye.  Postoperatively, however, she developed acute glaucoma necessitating  emergency treatment and readmission to the hospital. This cleared in 24-48  hours and the patient had an uneventful postoperative course. The patient's  vision postoperatively was recorded at 220/40 with correction. 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 Laser photocoagulation was  attempted but did not improve vision. Recently, the unoperated left eye has  deteriorated and the patient now has requested cataract implant surgery. She  was given oral discussion and printed information concerning the procedure  and its possible complications. She signed an informed consent and  authorization was obtained from her health plan.   PAST MEDICAL HISTORY:  The  patient is in stable general health under the  care of Dr. Ernestina Penna in [place]. She has continued to take Ocuvite  preservation tablets to reduce the chance of macular degeneration  progression.   MEDICATIONS:  She takes multiple other medications under his direction  including hydrochlorothiazide, a coated aspirin which she has discontinued  prior to this surgery, Nitro-Dur, Miacalcin, verapamil, Avandia, Amaryl,  Levoxyl, Toprol XL dipyridamole, Celebrex, Lipitor, and lorazepam.   ALLERGIES:  She has no known allergies.   REVIEW OF SYSTEMS:  No current cardiorespiratory complaints.   PHYSICAL EXAMINATION:  VITAL SIGNS: As recorded, on admission blood pressure  160/57, temperature 96.9, pulse 64, respirations 18.  GENERAL: The patient is a pleasant, slightly deaf 77 year old white female  in no acute distress.  HEENT:  Eyes: Visual acuity as noted above. Slit lamp examination:  The eyes  are white and clear with nuclear cataract formation right eye, a clear well-  centered posterior chamber implant left eye. Dry age-related macular  degeneration is noted in the right eye with a retinal pigment epithelial  defects. No hemorrhage, exudation or neurovascular is seen. The left eye  shows an old now dry age-related macular degeneration with RPE defects and  retinal pigment atrophy. No hemorrhage or neurovascularization is present.  LUNGS: Clear to percussion and auscultation.  HEART: Normal sinus rhythm.  No cardiomegaly. No murmurs.  ABDOMEN: Negative.  EXTREMITIES: Negative.   ADMISSION DIAGNOSES:  1.  Senile nuclear cataract right eye.  2.  Pseudophakia both eyes.  3.  Dry age-related macular degeneration both eyes.   PLAN:  Cataract implant surgery right.  side heroin Cecilie Kicks MD, thank you      HNJ/MEDQ  D:  04/27/2004  T:  04/27/2004  Job:  [id number]

## 2010-08-09 NOTE — Op Note (Signed)
[place]. [facility]  Patient:    RAMLA, HASE                     MRN: [id number] Proc. Date: 11/05/99 Adm. Date:  [id number] Attending:  Emeterio Reeve                           Operative Report  PREOPERATIVE DIAGNOSIS:  Severe spinal stenosis, L3-4, L4-5.  POSTOPERATIVE DIAGNOSIS:  Severe spinal stenosis, L3-4, L4-5.  OPERATION PERFORMED:  Decompressive laminectomy of L3 and L4 with undercutting of L5 and L2.  SURGEON:  Payton Doughty, M.D.  ANESTHESIA:  General endotracheal.  PREP:  Sterile Betadine prep and scrub with alcohol wipe.  COMPLICATIONS:  Dural tear.  DESCRIPTION OF PROCEDURE:  The patient is a 77 year old right-handed white female with severe spinal stenosis and neurogenic claudication.  The patient was taken to the operating room, smoothly anesthetized and intubated, and placed prone on the operating table.  Following shave, prep and drape in the usual sterile fashion the skin was infiltrated with 1% lidocaine with 1:400,000 epinephrine.  The skin was incised from mid-L2 to mid-L5.  The lamina of L2, L3, L4 and L5 were exposed bilaterally in the subperiosteal plane.  Intraoperative x-ray confirmed correctness of level.  Laminectomy of L3 and L4 were undertaken.  This was total laminectomy with removal of the lateral recess and all of the ligamentum flavum.  At 3-4 and 4-5 there was extremely tight stenosis.  On the left side at 3-4 dura was adherent to ligamentum flavum and it was lacerated when the ligamentum flavum was removed. This was repaired with a 6-0 Prolene without difficulty.  Valsalva maneuver showed no evidence of leak after the repair.  Following complete decompression at 3 and 4 and undercutting of L2-3 and L4-L5, all nerve roots were carefully inspected and found to be free.  The wound was irrigated and hemostasis assured.  The dural tear was once again inspected and no leak was observed. The paraspinous muscles  were reapproximated with 0 Vicryl in interrupted fashion.  The fascia was reapproximated with 0 Vicryl in interrupted fashion. Subcutaneous tissues were reapproximated with 0 Vicryl in interrupted fashion, subcuticular tissues reapproximated with 3-0 Vicryl in interrupted fashion. Skin was closed with 3-0 nylon in running locked fashion.  A Betadine Telfa dressing was applied and made occlusive with Op-Site.  The patient was then returned to the recovery room in good condition. DD:  11/05/99 TD:  11/06/99 Job: [id number]

## 2010-08-09 NOTE — Discharge Summary (Signed)
[place]. [facility]  Patient:    [name], [name]                     MRN: [id number] Adm. Date:  [id number] Disc. Date: [id number] Attending:  Herold Harms                           Discharge Summary  ADMISSION DIAGNOSIS:  Spondylosis at L3-4.  DISCHARGE DIAGNOSIS:  Spondylosis at L3-4.  OPERATIVE PROCEDURE:  Decompressive laminectomy at L3-4.  COMPLICATIONS:  None.  DISCHARGE STATUS:  Well.  HISTORY OF PRESENT ILLNESS:  This 77 year old, right-handed, white lady has been having difficulty to her left leg.  She has severe spinal stenosis on MRI and is admitted for decompressive laminectomy.  PAST MEDICAL HISTORY:  Remarkable for hypertension, diabetes, hypothyroidism, and TIA.  MEDICATIONS:  Amaryl, metoprolol, hydrochlorothiazide, dipyridamole, Levoxyl, verapamil, Lipitor, Bactrim, lorazepam, Celebrex, Avandia, Os-Cal, Ocuvite, vitamin E, coated aspirin, and Senokot.  PAST SURGICAL HISTORY:  Remarkable for cataract and tonsillectomy.  PHYSICAL EXAMINATION:  The general exam was within normal limits.  The neurologic exam was intact in the lower extremities, but she had significant claudication difficulties when she had been up and walking.  HOSPITAL COURSE:  She was admitted after ascertainment of normal laboratory values and underwent a decompressive laminectomy L3-4.  Postoperatively, she did reasonably well.  She was kept in the ICU so that appropriate attention could be paid to the patient.  She was visited in physical therapy on November 08, 1999, and began walking and mobilizing.  She still had difficulties with her ADLs.  She visited the rehabilitation folks on November 12, 1999.  On November 13, 1999, a bed was available for her and she was transferred there to become competent in her ADLs.  During the time of her stay at the ICU, her strength was full.  She had some difficulty with ambulation because of back pain.  Physiologically she  remained in good shape. DD:  12/27/99 TD:  12/28/99 Job: [id number]

## 2010-12-12 LAB — URINE CULTURE
Colony Count: NO GROWTH
Colony Count: NO GROWTH
Culture: NO GROWTH

## 2010-12-12 LAB — CBC
HCT: 31.2 — ABNORMAL LOW
HCT: 33.3 — ABNORMAL LOW
HCT: 34.3 — ABNORMAL LOW
Hemoglobin: 10.9 — ABNORMAL LOW
Hemoglobin: 11.3 — ABNORMAL LOW
Hemoglobin: 11.6 — ABNORMAL LOW
Hemoglobin: 11.6 — ABNORMAL LOW
Hemoglobin: 12.1
MCHC: 33.4
MCHC: 34.3
MCHC: 33
MCV: 94.1
MCV: 94.2
MCV: 95.3
MCV: 93.4
Platelets: 244
Platelets: 308
Platelets: 333
RBC: 3.42 — ABNORMAL LOW
RBC: 3.54 — ABNORMAL LOW
RBC: 3.57 — ABNORMAL LOW
RBC: 3.92
RDW: 13.7
RDW: 14.1
WBC: 17.4 — ABNORMAL HIGH
WBC: 17.4 — ABNORMAL HIGH
WBC: 18.8 — ABNORMAL HIGH
WBC: 19.4 — ABNORMAL HIGH
WBC: 18.9 — ABNORMAL HIGH

## 2010-12-12 LAB — CARDIAC PANEL(CRET KIN+CKTOT+MB+TROPI)
CK, MB: 2
CK, MB: 2
CK, MB: 2.7
Relative Index: 2.5
Relative Index: INVALID
Relative Index: INVALID
Relative Index: INVALID
Total CK: 60
Total CK: 93
Troponin I: 0.25 — ABNORMAL HIGH

## 2010-12-12 LAB — BASIC METABOLIC PANEL
BUN: 15
BUN: 22
CO2: 24
CO2: 27
CO2: 25
Calcium: 7.7 — ABNORMAL LOW
Calcium: 7.9 — ABNORMAL LOW
Calcium: 8.1 — ABNORMAL LOW
Chloride: 102
Chloride: 103
Chloride: 97
Chloride: 89 — ABNORMAL LOW
Creatinine, Ser: 1.27 — ABNORMAL HIGH
Creatinine, Ser: 1.45 — ABNORMAL HIGH
GFR calc Af Amer: 48 — ABNORMAL LOW
GFR calc Af Amer: 53 — ABNORMAL LOW
GFR calc Af Amer: >60
GFR calc Af Amer: 41 — ABNORMAL LOW
GFR calc non Af Amer: 40 — ABNORMAL LOW
GFR calc non Af Amer: 54 — ABNORMAL LOW
GFR calc non Af Amer: >60
Glucose, Bld: 204 — ABNORMAL HIGH
Glucose, Bld: 306 — ABNORMAL HIGH
Potassium: 2.9 — ABNORMAL LOW
Potassium: 3.8
Potassium: 4.5
Potassium: 4.9
Sodium: 130 — ABNORMAL LOW
Sodium: 130 — ABNORMAL LOW
Sodium: 131 — ABNORMAL LOW
Sodium: 134 — ABNORMAL LOW
Sodium: 135
Sodium: 123 — ABNORMAL LOW

## 2010-12-12 LAB — CULTURE, BLOOD (ROUTINE X 2)
Culture: NO GROWTH
Culture: NO GROWTH
Report Status: 1242009

## 2010-12-12 LAB — COMPREHENSIVE METABOLIC PANEL
BUN: 22
CO2: 25
Chloride: 96
Creatinine, Ser: 1.07
GFR calc non Af Amer: 49 — ABNORMAL LOW
Glucose, Bld: 294 — ABNORMAL HIGH
Total Bilirubin: 1.1

## 2010-12-12 LAB — POCT I-STAT 3, ART BLOOD GAS (G3+)
Operator id: 252031
Patient temperature: 100.7
pCO2 arterial: 42.5
pH, Arterial: 7.417 — ABNORMAL HIGH

## 2010-12-12 LAB — DIFFERENTIAL
Basophils Relative: 0
Eosinophils Absolute: 0
Eosinophils Absolute: 0
Eosinophils Relative: 0
Lymphocytes Relative: 6 — ABNORMAL LOW
Lymphocytes Relative: 6 — ABNORMAL LOW
Lymphs Abs: 1.2
Monocytes Absolute: 0.6
Neutro Abs: 17.2 — ABNORMAL HIGH
Neutrophils Relative %: 89 — ABNORMAL HIGH
Neutrophils Relative %: 91 — ABNORMAL HIGH

## 2010-12-12 LAB — URINE MICROSCOPIC-ADD ON

## 2010-12-12 LAB — URINALYSIS, ROUTINE W REFLEX MICROSCOPIC
Bilirubin Urine: NEGATIVE
Ketones, ur: 15 — AB
Leukocytes, UA: NEGATIVE
Nitrite: NEGATIVE
Nitrite: NEGATIVE
Specific Gravity, Urine: >1.03 — ABNORMAL HIGH
Urobilinogen, UA: 0.2
Urobilinogen, UA: 0.2
pH: 5.5
pH: 5

## 2010-12-12 LAB — CK TOTAL AND CKMB (NOT AT ARMC): Total CK: 63

## 2010-12-12 LAB — CREATININE, URINE, RANDOM: Creatinine, Urine: 178.3

## 2010-12-12 LAB — MAGNESIUM: Magnesium: 2.5

## 2010-12-12 LAB — SODIUM, URINE, RANDOM: Sodium, Ur: <10

## 2010-12-12 LAB — TSH
TSH: 5.491
TSH: 7.908 — ABNORMAL HIGH

## 2010-12-12 LAB — FOLATE RBC: RBC Folate: 899 — ABNORMAL HIGH

## 2010-12-12 LAB — T4, FREE: Free T4: 0.63 — ABNORMAL LOW

## 2010-12-12 LAB — PROTIME-INR: INR: 1.2

## 2010-12-12 LAB — OSMOLALITY: Osmolality: 278

## 2010-12-12 LAB — TROPONIN I: Troponin I: 0.24 — ABNORMAL HIGH

## 2010-12-12 LAB — APTT: aPTT: 32

## 2011-02-02 ENCOUNTER — Emergency Department (HOSPITAL_COMMUNITY)
Admission: EM | Admit: 2011-02-02 | Discharge: 2011-02-02 | Disposition: A | Discharge status: Skilled Nursing Facility | Payer: Medicare Other | Attending: Emergency Medicine | Admitting: Emergency Medicine

## 2011-02-02 ENCOUNTER — Emergency Department (HOSPITAL_COMMUNITY): Payer: Medicare Other

## 2011-02-02 ENCOUNTER — Encounter: Payer: Self-pay | Admitting: Emergency Medicine

## 2011-02-02 ENCOUNTER — Other Ambulatory Visit: Payer: Self-pay

## 2011-02-02 DIAGNOSIS — M79609 Pain in unspecified limb: Secondary | ICD-10-CM

## 2011-02-02 DIAGNOSIS — I10 Essential (primary) hypertension: Secondary | ICD-10-CM | POA: Insufficient documentation

## 2011-02-02 DIAGNOSIS — F29 Unspecified psychosis not due to a substance or known physiological condition: Secondary | ICD-10-CM | POA: Insufficient documentation

## 2011-02-02 DIAGNOSIS — E785 Hyperlipidemia, unspecified: Secondary | ICD-10-CM | POA: Insufficient documentation

## 2011-02-02 DIAGNOSIS — E039 Hypothyroidism, unspecified: Secondary | ICD-10-CM | POA: Insufficient documentation

## 2011-02-02 DIAGNOSIS — N390 Urinary tract infection, site not specified: Secondary | ICD-10-CM

## 2011-02-02 DIAGNOSIS — M7989 Other specified soft tissue disorders: Secondary | ICD-10-CM | POA: Insufficient documentation

## 2011-02-02 DIAGNOSIS — I252 Old myocardial infarction: Secondary | ICD-10-CM | POA: Insufficient documentation

## 2011-02-02 DIAGNOSIS — F341 Dysthymic disorder: Secondary | ICD-10-CM | POA: Insufficient documentation

## 2011-02-02 DIAGNOSIS — R404 Transient alteration of awareness: Secondary | ICD-10-CM | POA: Insufficient documentation

## 2011-02-02 DIAGNOSIS — K219 Gastro-esophageal reflux disease without esophagitis: Secondary | ICD-10-CM | POA: Insufficient documentation

## 2011-02-02 DIAGNOSIS — Z79899 Other long term (current) drug therapy: Secondary | ICD-10-CM | POA: Insufficient documentation

## 2011-02-02 DIAGNOSIS — R4182 Altered mental status, unspecified: Secondary | ICD-10-CM | POA: Insufficient documentation

## 2011-02-02 DIAGNOSIS — R259 Unspecified abnormal involuntary movements: Secondary | ICD-10-CM | POA: Insufficient documentation

## 2011-02-02 DIAGNOSIS — Z8673 Personal history of transient ischemic attack (TIA), and cerebral infarction without residual deficits: Secondary | ICD-10-CM | POA: Insufficient documentation

## 2011-02-02 DIAGNOSIS — R5381 Other malaise: Secondary | ICD-10-CM | POA: Insufficient documentation

## 2011-02-02 DIAGNOSIS — E119 Type 2 diabetes mellitus without complications: Secondary | ICD-10-CM | POA: Insufficient documentation

## 2011-02-02 HISTORY — DX: Depression, unspecified: F32.A

## 2011-02-02 HISTORY — DX: Acute myocardial infarction, unspecified: I21.9

## 2011-02-02 HISTORY — DX: Gastro-esophageal reflux disease without esophagitis: K21.9

## 2011-02-02 HISTORY — DX: Hypothyroidism, unspecified: E03.9

## 2011-02-02 HISTORY — DX: Pneumonia, unspecified organism: J18.9

## 2011-02-02 HISTORY — DX: Urinary tract infection, site not specified: N39.0

## 2011-02-02 HISTORY — DX: Anxiety disorder, unspecified: F41.9

## 2011-02-02 HISTORY — DX: Hyperlipidemia, unspecified: E78.5

## 2011-02-02 HISTORY — DX: Cerebral infarction, unspecified: I63.9

## 2011-02-02 HISTORY — DX: Essential (primary) hypertension: I10

## 2011-02-02 HISTORY — DX: Major depressive disorder, single episode, unspecified: F32.9

## 2011-02-02 LAB — URINE MICROSCOPIC-ADD ON

## 2011-02-02 LAB — URINALYSIS, ROUTINE W REFLEX MICROSCOPIC
Bilirubin Urine: NEGATIVE
Glucose, UA: NEGATIVE mg/dL
Ketones, ur: NEGATIVE mg/dL
Nitrite: NEGATIVE
Specific Gravity, Urine: 1.021 (ref 1.005–1.030)
pH: 6.5 (ref 5.0–8.0)

## 2011-02-02 LAB — COMPREHENSIVE METABOLIC PANEL
ALT: 18 U/L (ref 0–35)
AST: 17 U/L (ref 0–37)
Albumin: 3.4 g/dL — ABNORMAL LOW (ref 3.5–5.2)
Calcium: 9.2 mg/dL (ref 8.4–10.5)
Creatinine, Ser: 0.94 mg/dL (ref 0.50–1.10)
Sodium: 137 mEq/L (ref 135–145)
Total Protein: 6.8 g/dL (ref 6.0–8.3)

## 2011-02-02 LAB — DIFFERENTIAL
Basophils Absolute: 0 10*3/uL (ref 0.0–0.1)
Basophils Relative: 0 % (ref 0–1)
Eosinophils Absolute: 0.3 10*3/uL (ref 0.0–0.7)
Eosinophils Relative: 3 % (ref 0–5)
Lymphocytes Relative: 25 % (ref 12–46)
Monocytes Absolute: 0.6 10*3/uL (ref 0.1–1.0)

## 2011-02-02 LAB — GLUCOSE, CAPILLARY: Glucose-Capillary: 112 mg/dL — ABNORMAL HIGH (ref 70–99)

## 2011-02-02 LAB — CK TOTAL AND CKMB (NOT AT ARMC)
CK, MB: 3 ng/mL (ref 0.3–4.0)
Relative Index: INVALID (ref 0.0–2.5)

## 2011-02-02 LAB — CBC
MCH: 28.6 pg (ref 26.0–34.0)
MCHC: 31.3 g/dL (ref 30.0–36.0)
MCV: 91.6 fL (ref 78.0–100.0)
Platelets: 219 10*3/uL (ref 150–400)
RDW: 14.6 % (ref 11.5–15.5)
WBC: 9.4 10*3/uL (ref 4.0–10.5)

## 2011-02-02 LAB — TROPONIN I: Troponin I: <0.3 ng/mL (ref <0.30)

## 2011-02-02 MED ORDER — CIPROFLOXACIN IN D5W 400 MG/200ML IV SOLN
400.0000 mg | Freq: Once | INTRAVENOUS | Status: AC
  Administered 2011-02-02: 400 mg via INTRAVENOUS
  Filled 2011-02-02 (×2): qty 200

## 2011-02-02 MED ORDER — CIPROFLOXACIN HCL 500 MG PO TABS: 500.0000 mg | ORAL_TABLET | Freq: Two times a day (BID) | ORAL | Status: AC

## 2011-02-02 NOTE — Progress Notes (Signed)
*  PRELIMINARY RESULTS*   Left upper extremity venous Doppler has been performed.  There is no deep venous thrombosis or superficial thrombosis noted in the left upper extremity.  Sherren Kerns Renee 02/02/2011, 8:36 AM

## 2011-02-02 NOTE — ED Notes (Signed)
Pt c/o per family  Of fatigue gradual getting worse during the week. Family states pt normally able to hold conversation and more alert and perky.

## 2011-02-02 NOTE — ED Provider Notes (Addendum)
History     CSN: [id number] Arrival date & time: 02/02/2011 12:19 AM   First MD Initiated Contact with Patient 02/02/11 [phone]      Chief Complaint  Patient presents with  . Fatigue    pt from nursing home. [facility]. pt c/o generalized weakness for approx 8 hrs. pt arousable by painful stimuli    (Consider location/radiation/quality/duration/timing/severity/associated sxs/prior treatment) HPI Comments: 77 year old female with a history of 6 weeks of intermittent change in mental status. The family states that she will become mildly somnolent intermittently, will be temporarily unresponsive with speech but still follows commands, but this became much worse tonight and throughout the day. She still during this time has episodes where she is bright, alert, verbal and interactive with family members. At this time the patient is nonverbal but is following commands. She does get frequent urinary tract infections and was noted to have some foul-smelling urine earlier in the day.  The history is provided by a relative, the EMS personnel and the nursing home. The history is limited by the condition of the patient (Altered mental status).    Past Medical History  Diagnosis Date  . Dysphagia   . Diabetes mellitus   . Hypertension   . Hypothyroidism   . Hyperlipemia   . GERD (gastroesophageal reflux disease)   . Depression   . Anxiety   . Acute MI   . Pneumonia   . Stroke   . UTI (urinary tract infection)     History reviewed. No pertinent past surgical history.  History reviewed. No pertinent family history.  History  Substance Use Topics  . Smoking status: Not on file  . Smokeless tobacco: Not on file  . Alcohol Use:     OB History    Grav Para Term Preterm Abortions TAB SAB Ect Mult Living                  Review of Systems  Unable to perform ROS: Mental status change    Allergies  Review of patient's allergies indicates no known allergies.  Home Medications    Current Outpatient Rx  Name Route Sig Dispense Refill  . ALPRAZOLAM 0.25 MG PO TABS Oral Take 0.25 mg by mouth 2 (two) times daily.      Marland Kitchen AMLODIPINE BESYLATE 10 MG PO TABS Oral Take 10 mg by mouth daily.      . ASPIRIN 81 MG PO TABS Oral Take 81 mg by mouth daily.      . OCUVITE PO TABS Oral Take 1 tablet by mouth daily.      . DULOXETINE HCL 30 MG PO CPEP Oral Take 30 mg by mouth at bedtime.      . FUROSEMIDE 20 MG PO TABS Oral Take 20 mg by mouth daily.      Marland Kitchen HALOPERIDOL 1 MG PO TABS Oral Take 1 mg by mouth at bedtime.      . INSULIN GLARGINE 100 UNIT/ML [place] SOLN Subcutaneous Inject 78 Units into the skin at bedtime.      . INSULIN LISPRO (HUMAN) 100 UNIT/ML [place] SOLN Subcutaneous Inject 5 Units into the skin 3 (three) times daily before meals.      Marland Kitchen LEVOTHYROXINE SODIUM 100 MCG PO TABS Oral Take 100 mcg by mouth. Takes on Mon,Tues,Wed,Thurs and Fri     . LEVOTHYROXINE SODIUM 125 MCG PO TABS Oral Take 125 mcg by mouth. Takes on Sat and sun     . LOSARTAN POTASSIUM 100 MG PO TABS  Oral Take 100 mg by mouth daily.      Marland Kitchen METFORMIN HCL 500 MG PO TABS Oral Take 500 mg by mouth 2 (two) times daily with a meal.      . METOPROLOL TARTRATE 50 MG PO TABS Oral Take 50 mg by mouth 2 (two) times daily.      Marland Kitchen OMEPRAZOLE 20 MG PO CPDR Oral Take 20 mg by mouth 2 (two) times daily.      Marland Kitchen POLYETHYLENE GLYCOL 3350 PO PACK Oral Take 17 g by mouth daily.      Marland Kitchen POTASSIUM CHLORIDE 20 MEQ PO PACK Oral Take 20 mEq by mouth daily.      Marland Kitchen SIMVASTATIN 20 MG PO TABS Oral Take 20 mg by mouth daily.        BP 178/61  Temp(Src) 98.8 F (37.1 C) (Oral)  Resp 23  SpO2 99%  Physical Exam  Nursing note and vitals reviewed. Constitutional: She appears well-developed and well-nourished. No distress.  HENT:  Head: Normocephalic and atraumatic.  Mouth/Throat: Oropharynx is clear and moist. No oropharyngeal exudate.  Eyes: Conjunctivae and EOM are normal. Pupils are equal, round, and reactive to light. Right eye  exhibits no discharge. Left eye exhibits no discharge. No scleral icterus.  Neck: Normal range of motion. Neck supple. No JVD present. No thyromegaly present.  Cardiovascular: Normal rate, regular rhythm, normal heart sounds and intact distal pulses.  Exam reveals no gallop and no friction rub.   No murmur heard. Pulmonary/Chest: Effort normal and breath sounds normal. No respiratory distress. She has no wheezes. She has no rales.  Abdominal: Soft. Bowel sounds are normal. She exhibits no distension and no mass. There is no tenderness.  Musculoskeletal: Normal range of motion. She exhibits no tenderness.       LUE > RUE with swelling, no pitting edema  Lymphadenopathy:    She has no cervical adenopathy.  Neurological: She is alert.       Mild bilateral tremor (family states has been present for several weeks), follows commands with all 4 extremities, taking deep breaths, extraocular movements.  Patient does not speak answers to questions  Skin: Skin is warm and dry. No rash noted. No erythema.  Psychiatric: She has a normal mood and affect. Her behavior is normal.    ED Course  Procedures (including critical care time)  Labs Reviewed  APTT - Abnormal; Notable for the following:    aPTT 23 (*)    All other components within normal limits  COMPREHENSIVE METABOLIC PANEL - Abnormal; Notable for the following:    Glucose, Bld 174 (*)    Albumin 3.4 (*)    Total Bilirubin 0.2 (*)    GFR calc non Af Amer 52 (*)    GFR calc Af Amer 60 (*)    All other components within normal limits  URINALYSIS, ROUTINE W REFLEX MICROSCOPIC - Abnormal; Notable for the following:    Hgb urine dipstick MODERATE (*)    Protein, ur 100 (*)    Leukocytes, UA SMALL (*)    All other components within normal limits  URINE MICROSCOPIC-ADD ON - Abnormal; Notable for the following:    Squamous Epithelial / LPF FEW (*)    Bacteria, UA FEW (*)    All other components within normal limits  PROTIME-INR  CBC    DIFFERENTIAL  CK TOTAL AND CKMB  TROPONIN I  URINE CULTURE   Ct Head Wo Contrast  02/02/2011  *RADIOLOGY REPORT*  Clinical Data: Altered mental  status.  Confusion.  CT HEAD WITHOUT CONTRAST  Technique:  Contiguous axial images were obtained from the base of the skull through the vertex without contrast.  Comparison: Head CT 11/01/2002.  Findings: There is age related cerebral atrophy, ventriculomegaly and periventricular white matter disease.  There is a remote lacunar type infarct in the left basal ganglia region.  No extra- axial fluid collections are seen.  No CT findings for acute hemispheric infarction or intracranial hemorrhage.  No mass lesions.  The brainstem and cerebellum grossly normal.  The bony structures are intact.  No acute fracture.  The paranasal sinuses and mastoid air cells are clear.  Vascular calcifications are noted.  Globes are intact.  IMPRESSION:  1.  Age related cerebral atrophy, ventriculomegaly and periventricular white matter disease. 2.  Remote lacunar type basal ganglia infarct on the left. 3.  No acute intracranial findings or for mass lesions.  Original Report Authenticated By: P. Loralie Champagne, M.D.   Dg Chest Portable 1 View  02/02/2011  *RADIOLOGY REPORT*  Clinical Data: Altered mental status.  PORTABLE CHEST - 1 VIEW  Comparison: Chest x-ray 88 09/08/2008.  Findings: The heart is enlarged but stable.  There is tortuosity and calcification of the thoracic aorta.  The pulmonary hila appear stable.  Low lung volumes with mild vascular crowding.  No infiltrates, edema or atelectasis.  IMPRESSION: Stable cardiac enlargement and chronic lung changes but no acute pulmonary findings.  Original Report Authenticated By: P. Loralie Champagne, M.D.     No diagnosis found.    MDM  Waxing and waning mental status consistent with a possible delirium, however would consider TIA, metabolic, infectious source.     ED ECG REPORT   Date: 02/02/2011   Rate: 90  Rhythm:  normal sinus rhythm  QRS Axis: left  Intervals: normal  ST/T Wave abnormalities: normal  Conduction Disutrbances:left anterior fascicular block  Narrative Interpretation:   Old EKG Reviewed: changes noted rate slower today.  PVC present today   Over the course of the patient's stay, she has had multiple studies including a CT scan, chest x-ray, urinalysis, blood work, EKG which have not shown a definite source of her symptoms. According to the medication administration record from the nursing facility which has just been faxed over she has been getting scheduled Haldol every night. I feel that this scheduled antipsychotic may be causing her somnolence. After reevaluation this morning she is awake alert and aware of those around her. She answers questions clearly and is interactive. According to the family member she seems to be back towards herself. We will treat urine infection, urine culture ordered, back off on Haldol to half a milligram when necessary psychosis at night. We'll also get an ultrasound to further evaluate her left upper extremity as it does have an asymmetrical swelling. According to the family members this is new  Change of shift - care signed over to Dr. Jeraldine Loots.  UTI treated. Cx sent, [place] pending.  Vida Roller, MD 02/02/11 1610  Vida Roller, MD 02/02/11 [phone]

## 2011-02-02 NOTE — ED Notes (Signed)
Called [facility] to transport pt back to NH 

## 2011-02-02 NOTE — ED Notes (Signed)
Being taken back to [facility] by [employer]

## 2011-02-03 LAB — URINE CULTURE: Culture  Setup Time: 201211111047

## 2011-06-21 ENCOUNTER — Emergency Department (HOSPITAL_COMMUNITY): Payer: Medicare Other

## 2011-06-21 ENCOUNTER — Encounter (HOSPITAL_COMMUNITY): Payer: Self-pay | Admitting: *Deleted

## 2011-06-21 ENCOUNTER — Inpatient Hospital Stay (HOSPITAL_COMMUNITY)
Admission: EM | Admit: 2011-06-21 | Discharge: 2011-06-28 | DRG: 193 | Disposition: A | Discharge status: Skilled Nursing Facility | Payer: Medicare Other | Attending: Internal Medicine | Admitting: Internal Medicine

## 2011-06-21 DIAGNOSIS — K5900 Constipation, unspecified: Secondary | ICD-10-CM | POA: Diagnosis present

## 2011-06-21 DIAGNOSIS — T380X5A Adverse effect of glucocorticoids and synthetic analogues, initial encounter: Secondary | ICD-10-CM | POA: Diagnosis present

## 2011-06-21 DIAGNOSIS — IMO0002 Reserved for concepts with insufficient information to code with codable children: Secondary | ICD-10-CM | POA: Diagnosis present

## 2011-06-21 DIAGNOSIS — E1165 Type 2 diabetes mellitus with hyperglycemia: Secondary | ICD-10-CM | POA: Diagnosis present

## 2011-06-21 DIAGNOSIS — J189 Pneumonia, unspecified organism: Secondary | ICD-10-CM

## 2011-06-21 DIAGNOSIS — E860 Dehydration: Secondary | ICD-10-CM

## 2011-06-21 DIAGNOSIS — E785 Hyperlipidemia, unspecified: Secondary | ICD-10-CM

## 2011-06-21 DIAGNOSIS — J441 Chronic obstructive pulmonary disease with (acute) exacerbation: Secondary | ICD-10-CM | POA: Diagnosis present

## 2011-06-21 DIAGNOSIS — R109 Unspecified abdominal pain: Secondary | ICD-10-CM

## 2011-06-21 DIAGNOSIS — E875 Hyperkalemia: Secondary | ICD-10-CM | POA: Diagnosis present

## 2011-06-21 DIAGNOSIS — Z794 Long term (current) use of insulin: Secondary | ICD-10-CM

## 2011-06-21 DIAGNOSIS — I2510 Atherosclerotic heart disease of native coronary artery without angina pectoris: Secondary | ICD-10-CM | POA: Diagnosis present

## 2011-06-21 DIAGNOSIS — Z66 Do not resuscitate: Secondary | ICD-10-CM | POA: Diagnosis present

## 2011-06-21 DIAGNOSIS — R509 Fever, unspecified: Secondary | ICD-10-CM | POA: Diagnosis present

## 2011-06-21 DIAGNOSIS — F0390 Unspecified dementia without behavioral disturbance: Secondary | ICD-10-CM | POA: Diagnosis present

## 2011-06-21 DIAGNOSIS — G9341 Metabolic encephalopathy: Secondary | ICD-10-CM | POA: Diagnosis present

## 2011-06-21 DIAGNOSIS — J449 Chronic obstructive pulmonary disease, unspecified: Secondary | ICD-10-CM

## 2011-06-21 DIAGNOSIS — J90 Pleural effusion, not elsewhere classified: Secondary | ICD-10-CM | POA: Diagnosis present

## 2011-06-21 DIAGNOSIS — Z7982 Long term (current) use of aspirin: Secondary | ICD-10-CM

## 2011-06-21 DIAGNOSIS — E039 Hypothyroidism, unspecified: Secondary | ICD-10-CM | POA: Diagnosis present

## 2011-06-21 DIAGNOSIS — R0602 Shortness of breath: Secondary | ICD-10-CM | POA: Diagnosis present

## 2011-06-21 DIAGNOSIS — I252 Old myocardial infarction: Secondary | ICD-10-CM

## 2011-06-21 DIAGNOSIS — R4182 Altered mental status, unspecified: Secondary | ICD-10-CM | POA: Diagnosis present

## 2011-06-21 DIAGNOSIS — Z8673 Personal history of transient ischemic attack (TIA), and cerebral infarction without residual deficits: Secondary | ICD-10-CM

## 2011-06-21 DIAGNOSIS — F29 Unspecified psychosis not due to a substance or known physiological condition: Secondary | ICD-10-CM | POA: Diagnosis present

## 2011-06-21 DIAGNOSIS — I6990 Unspecified sequelae of unspecified cerebrovascular disease: Secondary | ICD-10-CM

## 2011-06-21 DIAGNOSIS — I10 Essential (primary) hypertension: Secondary | ICD-10-CM

## 2011-06-21 HISTORY — DX: Chronic obstructive pulmonary disease, unspecified: J44.9

## 2011-06-21 HISTORY — DX: Atherosclerotic heart disease of native coronary artery without angina pectoris: I25.10

## 2011-06-21 HISTORY — DX: Unspecified sequelae of unspecified cerebrovascular disease: I69.90

## 2011-06-21 LAB — DIFFERENTIAL
Eosinophils Relative: 1 % (ref 0–5)
Lymphs Abs: 1.4 10*3/uL (ref 0.7–4.0)
Monocytes Absolute: 0.8 10*3/uL (ref 0.1–1.0)
Monocytes Relative: 3 % (ref 3–12)
Neutro Abs: 24.7 10*3/uL — ABNORMAL HIGH (ref 1.7–7.7)

## 2011-06-21 LAB — POCT I-STAT 3, ART BLOOD GAS (G3+)
Bicarbonate: 28.3 mEq/L — ABNORMAL HIGH (ref 20.0–24.0)
O2 Saturation: 95 %
TCO2: 30 mmol/L (ref 0–100)
pCO2 arterial: 52.5 mmHg — ABNORMAL HIGH (ref 35.0–45.0)
pH, Arterial: 7.342 — ABNORMAL LOW (ref 7.350–7.400)
pO2, Arterial: 81 mmHg (ref 80.0–100.0)

## 2011-06-21 LAB — CBC
HCT: 41.7 % (ref 36.0–46.0)
MCH: 30.2 pg (ref 26.0–34.0)
MCV: 89.3 fL (ref 78.0–100.0)
Platelets: 211 10*3/uL (ref 150–400)
RBC: 4.67 MIL/uL (ref 3.87–5.11)
RDW: 14.2 % (ref 11.5–15.5)
WBC: 27.2 10*3/uL — ABNORMAL HIGH (ref 4.0–10.5)

## 2011-06-21 LAB — COMPREHENSIVE METABOLIC PANEL
AST: 24 U/L (ref 0–37)
BUN: 19 mg/dL (ref 6–23)
CO2: 25 mEq/L (ref 19–32)
Calcium: 8.9 mg/dL (ref 8.4–10.5)
Chloride: 95 mEq/L — ABNORMAL LOW (ref 96–112)
Creatinine, Ser: 1 mg/dL (ref 0.50–1.10)
GFR calc Af Amer: 56 mL/min — ABNORMAL LOW (ref >90)
GFR calc non Af Amer: 48 mL/min — ABNORMAL LOW (ref >90)
Glucose, Bld: 133 mg/dL — ABNORMAL HIGH (ref 70–99)
Total Bilirubin: 0.7 mg/dL (ref 0.3–1.2)

## 2011-06-21 LAB — URINALYSIS, ROUTINE W REFLEX MICROSCOPIC
Bilirubin Urine: NEGATIVE
Ketones, ur: 15 mg/dL — AB
Leukocytes, UA: NEGATIVE
Nitrite: NEGATIVE
Protein, ur: >300 mg/dL — AB
Urobilinogen, UA: 0.2 mg/dL (ref 0.0–1.0)
pH: 5.5 (ref 5.0–8.0)

## 2011-06-21 LAB — LIPASE, BLOOD: Lipase: 17 U/L (ref 11–59)

## 2011-06-21 LAB — URINE MICROSCOPIC-ADD ON

## 2011-06-21 LAB — APTT: aPTT: 24 seconds (ref 24–37)

## 2011-06-21 LAB — LACTIC ACID, PLASMA: Lactic Acid, Venous: 2.2 mmol/L (ref 0.5–2.2)

## 2011-06-21 LAB — TROPONIN I: Troponin I: <0.3 ng/mL (ref <0.30)

## 2011-06-21 LAB — PRO B NATRIURETIC PEPTIDE: Pro B Natriuretic peptide (BNP): 440.3 pg/mL (ref 0–450)

## 2011-06-21 LAB — PROTIME-INR: Prothrombin Time: 13.5 seconds (ref 11.6–15.2)

## 2011-06-21 MED ORDER — PIPERACILLIN-TAZOBACTAM 4.5 G IVPB
4.5000 g | Freq: Once | INTRAVENOUS | Status: AC
  Administered 2011-06-22: 4.5 g via INTRAVENOUS
  Filled 2011-06-21: qty 100

## 2011-06-21 MED ORDER — ALBUTEROL SULFATE (5 MG/ML) 0.5% IN NEBU
INHALATION_SOLUTION | RESPIRATORY_TRACT | Status: AC
  Filled 2011-06-21: qty 1

## 2011-06-21 MED ORDER — SODIUM CHLORIDE 0.9 % IV BOLUS (SEPSIS)
500.0000 mL | Freq: Once | INTRAVENOUS | Status: AC
  Administered 2011-06-21: 500 mL via INTRAVENOUS

## 2011-06-21 MED ORDER — IPRATROPIUM BROMIDE 0.02 % IN SOLN
RESPIRATORY_TRACT | Status: AC
  Filled 2011-06-21: qty 2.5

## 2011-06-21 MED ORDER — ONDANSETRON HCL 4 MG/2ML IJ SOLN
4.0000 mg | Freq: Once | INTRAMUSCULAR | Status: AC
  Administered 2011-06-21: 4 mg via INTRAVENOUS
  Filled 2011-06-21: qty 2

## 2011-06-21 MED ORDER — MORPHINE SULFATE 4 MG/ML IJ SOLN
4.0000 mg | Freq: Once | INTRAMUSCULAR | Status: AC
  Administered 2011-06-21: 4 mg via INTRAVENOUS
  Filled 2011-06-21: qty 1

## 2011-06-21 MED ORDER — SODIUM CHLORIDE 0.9 % IV SOLN: INTRAVENOUS | Status: DC

## 2011-06-21 MED ORDER — VANCOMYCIN HCL IN DEXTROSE 1-5 GM/200ML-% IV SOLN
1000.0000 mg | Freq: Once | INTRAVENOUS | Status: AC
  Administered 2011-06-21: 1000 mg via INTRAVENOUS
  Filled 2011-06-21: qty 200

## 2011-06-21 MED ORDER — DEXAMETHASONE SODIUM PHOSPHATE 10 MG/ML IJ SOLN
10.0000 mg | Freq: Once | INTRAMUSCULAR | Status: AC
  Administered 2011-06-21: 10 mg via INTRAVENOUS
  Filled 2011-06-21: qty 1

## 2011-06-21 NOTE — ED Notes (Signed)
Pt arrived by ems from [facility]. Decreased loc for unknown amount of time, possibly days. Family reports distended abd and possible UTI. ems reported wheezing, given albuterol tx pta and had spo2 100% pta.

## 2011-06-21 NOTE — ED Provider Notes (Addendum)
History     CSN: [id number]  Arrival date & time 06/21/11  2956   First MD Initiated Contact with Patient 06/21/11 1815      Chief Complaint  Patient presents with  . Altered Mental Status    (Consider location/radiation/quality/duration/timing/severity/associated sxs/prior treatment) HPI Comments: The patient is a 78 year old female with multiple medical problems who lives in a nursing home but is normally awake, alert, and oriented to person, place, time, and event with a good sense of humor says her daughter. Over the last 2 days, other family members had noted that the patient seemed slightly confused, gradually becoming more so, and today the daughter of the patient visited her and found her to be quite confused, alert, but disoriented, and speaking very little. The patient's daughter noticed that the patient seemed to be breathing hard, with faint wheezing, as well as demonstrating a distended abdomen. Per the patient's nursing facility she had not had a bowel movement in 3 days, which is not necessarily unusual for her. The nursing facility performed the disimpaction removing a moderate amount of stool, but without subsequent bowel movement or relief of abdominal distention. The patient had remarked yesterday about feeling nauseated but has had no vomiting. She has not been eating as much as usual and has not been drinking either. The patient's daughter states that this is a similar presentation to a previous manifestation of urinary tract infection. The patient is also noted to have a history of heart disease and  congestive heart failure. she has no history of abdominal surgery.   Patient is a 78 y.o. female presenting with altered mental status and shortness of breath. The history is provided by the nursing home, the EMS personnel and a relative (The patient's daughter noticed altered mental status, wheezing, dyspnea, abdominal distention today.). History Limited By: altered mental  status/confusion of the patient - she is unable to give HPI or ROS.  Altered Mental Status This is a new problem. Episode onset: unknown, the patient's daughter thinks that she had been becoming gradually more confused over the last two days, but that today it was marked.  Pt. usually A&Ox3. The problem occurs constantly. The problem has been gradually worsening. Associated symptoms include shortness of breath. Pertinent negatives include no abdominal pain. The symptoms are aggravated by nothing. The symptoms are relieved by nothing.  Shortness of Breath  The current episode started yesterday. The onset was gradual. The problem occurs continuously. The problem has been gradually worsening. The problem is mild. The symptoms are relieved by rest (oxygen). The symptoms are aggravated by activity. Associated symptoms include shortness of breath and wheezing. Pertinent negatives include no fever, no rhinorrhea, no stridor and no cough. Her past medical history is significant for past wheezing. Past medical history comments: copd. Behavior: confused but awake and alert - pt. using very sparse words. Sick contacts: The patient resides in a nursing home.    Past Medical History  Diagnosis Date  . Dysphagia   . Diabetes mellitus   . Hypertension   . Hypothyroidism   . Hyperlipemia   . GERD (gastroesophageal reflux disease)   . Depression   . Anxiety   . Acute MI   . Pneumonia   . Stroke   . UTI (urinary tract infection)   . COPD (chronic obstructive pulmonary disease)     History reviewed. No pertinent past surgical history.  History reviewed. No pertinent family history.  History  Substance Use Topics  . Smoking status: Not on  file  . Smokeless tobacco: Not on file  . Alcohol Use:     OB History    Grav Para Term Preterm Abortions TAB SAB Ect Mult Living                  Review of Systems  Unable to perform ROS Constitutional: Negative for fever.  HENT: Negative for rhinorrhea.     Respiratory: Positive for shortness of breath and wheezing. Negative for cough and stridor.   Gastrointestinal: Negative for abdominal pain.  Psychiatric/Behavioral: Positive for altered mental status.    Allergies  Review of patient's allergies indicates no known allergies.  Home Medications   Current Outpatient Rx  Name Route Sig Dispense Refill  . ALPRAZOLAM 0.25 MG PO TABS Oral Take 0.25 mg by mouth 2 (two) times daily.      Marland Kitchen AMLODIPINE BESYLATE 10 MG PO TABS Oral Take 10 mg by mouth daily.      . ASPIRIN 81 MG PO TABS Oral Take 81 mg by mouth daily.      . OCUVITE PO TABS Oral Take 1 tablet by mouth daily.      . DULOXETINE HCL 30 MG PO CPEP Oral Take 30 mg by mouth at bedtime.      . FUROSEMIDE 20 MG PO TABS Oral Take 20 mg by mouth daily.      . INSULIN GLARGINE 100 UNIT/ML [place] SOLN Subcutaneous Inject 75 Units into the skin at bedtime.     . INSULIN LISPRO (HUMAN) 100 UNIT/ML [place] SOLN Subcutaneous Inject 5 Units into the skin 3 (three) times daily before meals.      Marland Kitchen LEVOTHYROXINE SODIUM 100 MCG PO TABS Oral Take 100 mcg by mouth. Takes on Mon,Tues,Wed,Thurs and Fri     . LEVOTHYROXINE SODIUM 125 MCG PO TABS Oral Take 125 mcg by mouth. Takes on Sat and sun     . LOSARTAN POTASSIUM 100 MG PO TABS Oral Take 100 mg by mouth daily.      Marland Kitchen MAGNESIUM HYDROXIDE 400 MG/5ML PO SUSP Oral Take 30 mLs by mouth daily as needed. For constipation    . METFORMIN HCL 500 MG PO TABS Oral Take 500 mg by mouth 2 (two) times daily with a meal.      . METOPROLOL TARTRATE 50 MG PO TABS Oral Take 50 mg by mouth 2 (two) times daily.      Marland Kitchen OMEPRAZOLE 20 MG PO CPDR Oral Take 20 mg by mouth 2 (two) times daily.      Marland Kitchen POLYETHYLENE GLYCOL 3350 PO PACK Oral Take 17 g by mouth daily.      Marland Kitchen POTASSIUM CHLORIDE 20 MEQ PO PACK Oral Take 20 mEq by mouth daily.      Marland Kitchen SIMVASTATIN 20 MG PO TABS Oral Take 20 mg by mouth daily.        BP 122/102  Temp(Src) 99.6 F (37.6 C) (Oral)  Resp 22  SpO2  100%  Physical Exam  Nursing note and vitals reviewed. Constitutional: She appears distressed.       The patient appears to be experiencing slightly labored breathing, and is awake and alert, but minimally responsive to verbal interaction, and intermittently following commands. She does not appear to be in pain.  HENT:  Head: Normocephalic and atraumatic.  Right Ear: Tympanic membrane, external ear and ear canal normal.  Left Ear: Tympanic membrane, external ear and ear canal normal.  Nose: Nose normal. No mucosal edema or rhinorrhea. Right sinus exhibits no  maxillary sinus tenderness and no frontal sinus tenderness. Left sinus exhibits no maxillary sinus tenderness and no frontal sinus tenderness.  Mouth/Throat: Uvula is midline. Mucous membranes are dry. No uvula swelling. No oropharyngeal exudate, posterior oropharyngeal edema, posterior oropharyngeal erythema or tonsillar abscesses.  Eyes: Conjunctivae and EOM are normal. Pupils are equal, round, and reactive to light. No scleral icterus.  Neck: Normal range of motion. Neck supple. No JVD present. No tracheal deviation present.  Cardiovascular: Normal rate, regular rhythm and intact distal pulses.   Occasional extrasystoles are present. Exam reveals distant heart sounds. Exam reveals no gallop and no friction rub.   No murmur heard. Pulmonary/Chest: Accessory muscle usage present. No stridor. Tachypnea noted. She is in respiratory distress. She has decreased breath sounds in the right upper field and the left upper field. She has wheezes in the right middle field and the left middle field. She has no rhonchi. She has rales in the right lower field and the left lower field. She exhibits no tenderness.       Mild respiratory distress noted, with some accessory muscle usage and belly breathing, mild tachypnea 22-24 breaths a minute, slightly decreased breath sounds in the upper lung fields, very faint wheezing from the middle lung fields, no  rhonchi, bibasilar Rales, good air exchange overall.  Abdominal: Soft. Bowel sounds are normal. She exhibits distension. She exhibits no fluid wave, no ascites and no mass. There is no hepatosplenomegaly. There is no tenderness. There is no rigidity, no rebound, no guarding and no CVA tenderness. A hernia is present. Hernia confirmed positive in the ventral area.       Wide separation of the abdominal muscles, or diastases recti without herniated mesenteric tissue palpable.  Musculoskeletal: Normal range of motion. She exhibits edema. She exhibits no tenderness.       Right lower leg: She exhibits edema. She exhibits no tenderness.       Left lower leg: She exhibits edema. She exhibits no tenderness.  Lymphadenopathy:       Bilateral 1-2+ pitting edema in the lower extremities.  Neurological: She is alert. No cranial nerve deficit. She exhibits normal muscle tone.  Skin: Skin is warm and dry. No rash noted. She is not diaphoretic. No erythema. No pallor.  Psychiatric:       Blunted affect, the patient seems somewhat detached from her immediate surroundings, not interacting with people, just looking around the room.    ED Course  Procedures (including critical care time)   Date: 06/21/2011  Rate: 96  Rhythm: normal sinus rhythm  QRS Axis: left  Intervals: normal  ST/T Wave abnormalities: nonspecific T wave changes  Conduction Disutrbances:none  Narrative Interpretation: Poor quality EKG secondary to motion artifact as the patient would not remain still, however despite this chart coming, there is no obvious myocardial ischemia or severe conduction abnormality or arrhythmia.  Old EKG Reviewed: No significant changes    Labs Reviewed  CULTURE, BLOOD (ROUTINE X 2)  CULTURE, BLOOD (ROUTINE X 2)  CBC  DIFFERENTIAL  COMPREHENSIVE METABOLIC PANEL  LIPASE, BLOOD  LACTIC ACID, PLASMA  URINALYSIS, ROUTINE W REFLEX MICROSCOPIC  URINE CULTURE  TROPONIN I  PROTIME-INR  APTT  BLOOD GAS,  ARTERIAL  PRO B NATRIURETIC PEPTIDE  AMMONIA   No results found.   No diagnosis found.    MDM  The patient's differential diagnosis is fairly broad, with possible etiologies of altered mental status including hypoxia, hypercarbia, respiratory acidosis, metabolic acidosis, electrolyte abnormality, hyperglycemia, hypoglycemia, hepatic encephalopathy,  renal failure and uremia, or septic encephalopathy. Possible etiology of sepsis includes healthcare associated pneumonia and urinary tract infection. Her dyspnea and pulmonary findings could be the result of either pneumonia, bronchitis, COPD exacerbation, or congestive heart failure exacerbation, possibly secondary to a silent myocardial infarction. Her abdominal findings could be due to simply constipation, constipation compounded by fluid retention in acute congestive heart failure, bowel obstruction. The patient appears to likely need admission.  Felisa Bonier, MD 06/21/11 1945  The patient does not have apparent urinary tract infection, and there is no frank or obvious pneumonia on her chest x-ray, no sign of congestive heart failure, pulmonary edema, pulmonary vascular congestion, and her B. natruretic peptide and troponin do not suggest acute myocardial infarction or acute congestive heart failure. Her kidney function is preserved, her lactate is not significantly elevated to indicate septic shock or mesenteric ischemia out right, and while she has not had vomiting in the emergency department, her abdominal distention is concerning to me: For constipation 4 possible bowel obstruction, and I also want to further exclude intra-abdominal source of infection, or ischemia of the mesentery do to the patient's altered mental status and leukocytosis. The patient will go for a CT scan to evaluate this further. I have ordered vancomycin and Zosyn as empiric antibiotics to cover for any intra-abdominal infection, or alternately, for healthcare associated  pneumonia, as the patient does have Rales bibasilarly on examination an increased need for oxygen, and it would be possible that a pneumonia may be hiding in the retrocardiac space or do to overall dehydration, not manifested radiologically.  Her vital signs at this time remains stable. The patient's family was updated on the findings and plan.  Felisa Bonier, MD 06/21/11 [phone]

## 2011-06-22 ENCOUNTER — Encounter (HOSPITAL_COMMUNITY): Payer: Self-pay | Admitting: Internal Medicine

## 2011-06-22 ENCOUNTER — Emergency Department (HOSPITAL_COMMUNITY): Payer: Medicare Other

## 2011-06-22 DIAGNOSIS — R109 Unspecified abdominal pain: Secondary | ICD-10-CM | POA: Diagnosis present

## 2011-06-22 DIAGNOSIS — E785 Hyperlipidemia, unspecified: Secondary | ICD-10-CM | POA: Insufficient documentation

## 2011-06-22 DIAGNOSIS — I6990 Unspecified sequelae of unspecified cerebrovascular disease: Secondary | ICD-10-CM | POA: Insufficient documentation

## 2011-06-22 DIAGNOSIS — J90 Pleural effusion, not elsewhere classified: Secondary | ICD-10-CM | POA: Diagnosis present

## 2011-06-22 DIAGNOSIS — R1310 Dysphagia, unspecified: Secondary | ICD-10-CM | POA: Insufficient documentation

## 2011-06-22 DIAGNOSIS — R0602 Shortness of breath: Secondary | ICD-10-CM | POA: Diagnosis present

## 2011-06-22 DIAGNOSIS — J449 Chronic obstructive pulmonary disease, unspecified: Secondary | ICD-10-CM | POA: Insufficient documentation

## 2011-06-22 DIAGNOSIS — I2510 Atherosclerotic heart disease of native coronary artery without angina pectoris: Secondary | ICD-10-CM | POA: Insufficient documentation

## 2011-06-22 DIAGNOSIS — E039 Hypothyroidism, unspecified: Secondary | ICD-10-CM | POA: Insufficient documentation

## 2011-06-22 DIAGNOSIS — G9341 Metabolic encephalopathy: Secondary | ICD-10-CM | POA: Diagnosis present

## 2011-06-22 DIAGNOSIS — I10 Essential (primary) hypertension: Secondary | ICD-10-CM | POA: Insufficient documentation

## 2011-06-22 DIAGNOSIS — R509 Fever, unspecified: Secondary | ICD-10-CM | POA: Diagnosis present

## 2011-06-22 LAB — CBC
MCHC: 33 g/dL (ref 30.0–36.0)
MCV: 90.9 fL (ref 78.0–100.0)
Platelets: 166 10*3/uL (ref 150–400)
RDW: 14.5 % (ref 11.5–15.5)
WBC: 20.2 10*3/uL — ABNORMAL HIGH (ref 4.0–10.5)

## 2011-06-22 LAB — BASIC METABOLIC PANEL
CO2: 28 mEq/L (ref 19–32)
Calcium: 8.4 mg/dL (ref 8.4–10.5)
Creatinine, Ser: 1.11 mg/dL — ABNORMAL HIGH (ref 0.50–1.10)

## 2011-06-22 LAB — URINE CULTURE: Culture  Setup Time: 201303310226

## 2011-06-22 LAB — GLUCOSE, CAPILLARY
Glucose-Capillary: 256 mg/dL — ABNORMAL HIGH (ref 70–99)
Glucose-Capillary: 289 mg/dL — ABNORMAL HIGH (ref 70–99)
Glucose-Capillary: 217 mg/dL — ABNORMAL HIGH (ref 70–99)

## 2011-06-22 MED ORDER — ALUM & MAG HYDROXIDE-SIMETH 200-200-20 MG/5ML PO SUSP: 30.0000 mL | Freq: Four times a day (QID) | ORAL | Status: DC | PRN

## 2011-06-22 MED ORDER — SIMVASTATIN 20 MG PO TABS
20.0000 mg | ORAL_TABLET | Freq: Every day | ORAL | Status: DC
  Filled 2011-06-22: qty 1

## 2011-06-22 MED ORDER — OXYCODONE HCL 5 MG PO TABS
5.0000 mg | ORAL_TABLET | ORAL | Status: DC | PRN
  Administered 2011-06-24 – 2011-06-27 (×2): 5 mg via ORAL
  Filled 2011-06-22 (×2): qty 1

## 2011-06-22 MED ORDER — ACETAMINOPHEN 650 MG RE SUPP: 650.0000 mg | Freq: Four times a day (QID) | RECTAL | Status: DC | PRN

## 2011-06-22 MED ORDER — HYDROMORPHONE HCL PF 1 MG/ML IJ SOLN: 0.5000 mg | INTRAMUSCULAR | Status: DC | PRN

## 2011-06-22 MED ORDER — SODIUM CHLORIDE 0.9 % IV SOLN
INTRAVENOUS | Status: DC
  Administered 2011-06-22: 21:00:00 via INTRAVENOUS

## 2011-06-22 MED ORDER — ALBUTEROL SULFATE (5 MG/ML) 0.5% IN NEBU
2.5000 mg | INHALATION_SOLUTION | Freq: Four times a day (QID) | RESPIRATORY_TRACT | Status: DC
  Administered 2011-06-22 – 2011-06-25 (×13): 2.5 mg via RESPIRATORY_TRACT
  Filled 2011-06-22 (×14): qty 0.5

## 2011-06-22 MED ORDER — AMLODIPINE BESYLATE 10 MG PO TABS
10.0000 mg | ORAL_TABLET | Freq: Every day | ORAL | Status: DC
Start: 2011-06-22 — End: 2011-06-28
  Administered 2011-06-22 – 2011-06-28 (×7): 10 mg via ORAL
  Filled 2011-06-22 (×7): qty 1

## 2011-06-22 MED ORDER — INSULIN ASPART 100 UNIT/ML ~~LOC~~ SOLN
0.0000 [IU] | Freq: Every day | SUBCUTANEOUS | Status: DC
  Administered 2011-06-22: 2 [IU] via SUBCUTANEOUS
  Administered 2011-06-23 – 2011-06-25 (×3): 3 [IU] via SUBCUTANEOUS
  Administered 2011-06-26: 2 [IU] via SUBCUTANEOUS
  Administered 2011-06-27: 3 [IU] via SUBCUTANEOUS

## 2011-06-22 MED ORDER — POLYETHYLENE GLYCOL 3350 17 G PO PACK
17.0000 g | PACK | Freq: Every day | ORAL | Status: DC
  Administered 2011-06-23 – 2011-06-26 (×4): 17 g via ORAL
  Filled 2011-06-22 (×6): qty 1

## 2011-06-22 MED ORDER — LEVOTHYROXINE SODIUM 100 MCG PO TABS
100.0000 ug | ORAL_TABLET | Freq: Every day | ORAL | Status: DC
  Administered 2011-06-23 – 2011-06-28 (×6): 100 ug via ORAL
  Filled 2011-06-22 (×8): qty 1

## 2011-06-22 MED ORDER — INSULIN ASPART 100 UNIT/ML ~~LOC~~ SOLN
0.0000 [IU] | Freq: Three times a day (TID) | SUBCUTANEOUS | Status: DC
  Administered 2011-06-22: 3 [IU] via SUBCUTANEOUS
  Administered 2011-06-22: 5 [IU] via SUBCUTANEOUS
  Administered 2011-06-22: 3 [IU] via SUBCUTANEOUS
  Administered 2011-06-23: 2 [IU] via SUBCUTANEOUS
  Administered 2011-06-23: 3 [IU] via SUBCUTANEOUS
  Administered 2011-06-23: 9 [IU] via SUBCUTANEOUS
  Administered 2011-06-24: 7 [IU] via SUBCUTANEOUS
  Administered 2011-06-24: 9 [IU] via SUBCUTANEOUS
  Administered 2011-06-24: 5 [IU] via SUBCUTANEOUS
  Administered 2011-06-25: 7 [IU] via SUBCUTANEOUS
  Administered 2011-06-25: 5 [IU] via SUBCUTANEOUS
  Administered 2011-06-25: 2 [IU] via SUBCUTANEOUS
  Administered 2011-06-26: 1 [IU] via SUBCUTANEOUS
  Administered 2011-06-26 – 2011-06-27 (×3): 3 [IU] via SUBCUTANEOUS
  Administered 2011-06-28: 2 [IU] via SUBCUTANEOUS

## 2011-06-22 MED ORDER — ONDANSETRON HCL 4 MG PO TABS: 4.0000 mg | ORAL_TABLET | Freq: Four times a day (QID) | ORAL | Status: DC | PRN

## 2011-06-22 MED ORDER — ALPRAZOLAM 0.25 MG PO TABS
0.2500 mg | ORAL_TABLET | Freq: Two times a day (BID) | ORAL | Status: DC
Start: 2011-06-22 — End: 2011-06-28
  Administered 2011-06-22 – 2011-06-28 (×11): 0.25 mg via ORAL
  Filled 2011-06-22 (×12): qty 1

## 2011-06-22 MED ORDER — INSULIN ASPART 100 UNIT/ML ~~LOC~~ SOLN
5.0000 [IU] | Freq: Once | SUBCUTANEOUS | Status: AC
  Administered 2011-06-22: 5 [IU] via INTRAVENOUS

## 2011-06-22 MED ORDER — PANTOPRAZOLE SODIUM 40 MG PO TBEC
40.0000 mg | DELAYED_RELEASE_TABLET | Freq: Every day | ORAL | Status: DC
  Administered 2011-06-22 – 2011-06-28 (×7): 40 mg via ORAL
  Filled 2011-06-22 (×5): qty 1

## 2011-06-22 MED ORDER — OCUVITE PO TABS
1.0000 | ORAL_TABLET | Freq: Every day | ORAL | Status: DC
Start: 2011-06-22 — End: 2011-06-28
  Administered 2011-06-22 – 2011-06-28 (×7): 1 via ORAL
  Filled 2011-06-22 (×7): qty 1

## 2011-06-22 MED ORDER — IOHEXOL 300 MG/ML  SOLN
100.0000 mL | Freq: Once | INTRAMUSCULAR | Status: AC | PRN
  Administered 2011-06-22: 100 mL via INTRAVENOUS

## 2011-06-22 MED ORDER — ENOXAPARIN SODIUM 30 MG/0.3ML ~~LOC~~ SOLN
30.0000 mg | SUBCUTANEOUS | Status: DC
  Administered 2011-06-22 – 2011-06-28 (×7): 30 mg via SUBCUTANEOUS
  Filled 2011-06-22 (×7): qty 0.3

## 2011-06-22 MED ORDER — LOSARTAN POTASSIUM 50 MG PO TABS
100.0000 mg | ORAL_TABLET | Freq: Every day | ORAL | Status: DC
  Filled 2011-06-22: qty 2

## 2011-06-22 MED ORDER — ENOXAPARIN SODIUM 40 MG/0.4ML ~~LOC~~ SOLN
40.0000 mg | SUBCUTANEOUS | Status: DC
  Filled 2011-06-22: qty 0.4

## 2011-06-22 MED ORDER — DEXTROSE 50 % IV SOLN
1.0000 | Freq: Once | INTRAVENOUS | Status: AC
  Administered 2011-06-22: 50 mL via INTRAVENOUS
  Filled 2011-06-22: qty 50

## 2011-06-22 MED ORDER — VANCOMYCIN HCL 1000 MG IV SOLR
750.0000 mg | Freq: Two times a day (BID) | INTRAVENOUS | Status: AC
  Administered 2011-06-22 – 2011-06-25 (×7): 750 mg via INTRAVENOUS
  Filled 2011-06-22 (×7): qty 750

## 2011-06-22 MED ORDER — SODIUM POLYSTYRENE SULFONATE 15 GM/60ML PO SUSP
30.0000 g | Freq: Once | ORAL | Status: AC
  Administered 2011-06-22: 30 g via ORAL
  Filled 2011-06-22: qty 120

## 2011-06-22 MED ORDER — SODIUM CHLORIDE 0.9 % IV SOLN
1.0000 g | Freq: Once | INTRAVENOUS | Status: AC
  Administered 2011-06-22: 1 g via INTRAVENOUS
  Filled 2011-06-22: qty 10

## 2011-06-22 MED ORDER — INSULIN GLARGINE 100 UNIT/ML ~~LOC~~ SOLN
75.0000 [IU] | Freq: Every day | SUBCUTANEOUS | Status: DC
  Administered 2011-06-23: 75 [IU] via SUBCUTANEOUS

## 2011-06-22 MED ORDER — ALBUTEROL SULFATE (5 MG/ML) 0.5% IN NEBU
2.5000 mg | INHALATION_SOLUTION | RESPIRATORY_TRACT | Status: DC | PRN
  Administered 2011-06-26: 2.5 mg via RESPIRATORY_TRACT
  Filled 2011-06-22: qty 0.5

## 2011-06-22 MED ORDER — ONDANSETRON HCL 4 MG/2ML IJ SOLN: 4.0000 mg | Freq: Four times a day (QID) | INTRAMUSCULAR | Status: DC | PRN

## 2011-06-22 MED ORDER — ASPIRIN EC 81 MG PO TBEC
81.0000 mg | DELAYED_RELEASE_TABLET | Freq: Every day | ORAL | Status: DC
  Administered 2011-06-22 – 2011-06-28 (×8): 81 mg via ORAL
  Filled 2011-06-22 (×7): qty 1

## 2011-06-22 MED ORDER — ACETAMINOPHEN 325 MG PO TABS
650.0000 mg | ORAL_TABLET | Freq: Four times a day (QID) | ORAL | Status: DC | PRN
  Administered 2011-06-27: 650 mg via ORAL
  Filled 2011-06-22: qty 2

## 2011-06-22 MED ORDER — DULOXETINE HCL 30 MG PO CPEP
30.0000 mg | ORAL_CAPSULE | Freq: Every day | ORAL | Status: DC
Start: 2011-06-22 — End: 2011-06-28
  Administered 2011-06-22 – 2011-06-27 (×5): 30 mg via ORAL
  Filled 2011-06-22 (×7): qty 1

## 2011-06-22 MED ORDER — METOPROLOL TARTRATE 50 MG PO TABS
50.0000 mg | ORAL_TABLET | Freq: Two times a day (BID) | ORAL | Status: DC
  Administered 2011-06-22 – 2011-06-23 (×3): 50 mg via ORAL
  Filled 2011-06-22 (×4): qty 1

## 2011-06-22 MED ORDER — PIPERACILLIN-TAZOBACTAM 3.375 G IVPB
3.3750 g | Freq: Three times a day (TID) | INTRAVENOUS | Status: AC
  Administered 2011-06-22 – 2011-06-25 (×12): 3.375 g via INTRAVENOUS
  Filled 2011-06-22 (×14): qty 50

## 2011-06-22 NOTE — Progress Notes (Signed)
I saw and examined Ms [name] at bed side. Reviewed her chart. Patient somnolent. Labs show hyperkalemia, and worsening renal insufficiency. Will correct potassium(calcium gluconate/insulin/d50w/kayexalate), d/c arb/statin, continue ivf. Otherwise continue orders per Dr Lovell Sheehan.  Padraig Nhan,MD pager#[phone].

## 2011-06-22 NOTE — Progress Notes (Signed)
ANTIBIOTIC CONSULT NOTE - INITIAL  Pharmacy Consult for vancomycin and zosyn  Indication: HAP  No Known Allergies  Patient Measurements: Height: 5\' 9"  (175.3 cm) Weight: 189 lb (85.73 kg) IBW/kg (Calculated) : 66.2  Adjusted Body Weight:   Vital Signs: Temp: 98.5 F (36.9 C) (03/31 0400) Temp src: Oral (03/30 1814) BP: 152/84 mmHg (03/31 0400) Pulse Rate: 91  (03/31 0400) Intake/Output from previous day:   Intake/Output from this shift:    Labs:  Basename 06/21/11 2015  WBC 27.2*  HGB 14.1  PLT 211  LABCREA --  CREATININE 1.00   Estimated Creatinine Clearance: 43.7 ml/min (by C-G formula based on Cr of 1). No results found for this basename: VANCOTROUGH:2,VANCOPEAK:2,VANCORANDOM:2,GENTTROUGH:2,GENTPEAK:2,GENTRANDOM:2,TOBRATROUGH:2,TOBRAPEAK:2,TOBRARND:2,AMIKACINPEAK:2,AMIKACINTROU:2,AMIKACIN:2, in the last 72 hours   Microbiology: No results found for this or any previous visit (from the past 720 hour(s)).  Medical History: Past Medical History  Diagnosis Date  . Dysphagia   . Diabetes mellitus   . Hypertension   . Hypothyroidism   . Hyperlipemia   . GERD (gastroesophageal reflux disease)   . Depression   . Anxiety   . Acute MI   . Pneumonia   . Stroke   . UTI (urinary tract infection)   . COPD (chronic obstructive pulmonary disease)   . CAD (coronary artery disease)   . Late effects of CVA (cerebrovascular accident)     Medications:  Prescriptions prior to admission  Medication Sig Dispense Refill  . ALPRAZolam (XANAX) 0.25 MG tablet Take 0.25 mg by mouth 2 (two) times daily.        Marland Kitchen amLODipine (NORVASC) 10 MG tablet Take 10 mg by mouth daily.        Marland Kitchen aspirin 81 MG tablet Take 81 mg by mouth daily.        . beta carotene w/minerals (OCUVITE) tablet Take 1 tablet by mouth daily.        . DULoxetine (CYMBALTA) 30 MG capsule Take 30 mg by mouth at bedtime.        . furosemide (LASIX) 20 MG tablet Take 20 mg by mouth daily.        . insulin glargine  (LANTUS) 100 UNIT/ML injection Inject 75 Units into the skin at bedtime.       . insulin lispro (HUMALOG) 100 UNIT/ML injection Inject 5 Units into the skin 3 (three) times daily before meals.        Marland Kitchen levothyroxine (SYNTHROID, LEVOTHROID) 100 MCG tablet Take 100 mcg by mouth. Takes on Mon,Tues,Wed,Thurs and Fri       . levothyroxine (SYNTHROID, LEVOTHROID) 125 MCG tablet Take 125 mcg by mouth. Takes on Sat and sun       . losartan (COZAAR) 100 MG tablet Take 100 mg by mouth daily.        . magnesium hydroxide (MILK OF MAGNESIA) 400 MG/5ML suspension Take 30 mLs by mouth daily as needed. For constipation      . metFORMIN (GLUCOPHAGE) 500 MG tablet Take 500 mg by mouth 2 (two) times daily with a meal.        . metoprolol (LOPRESSOR) 50 MG tablet Take 50 mg by mouth 2 (two) times daily.        Marland Kitchen omeprazole (PRILOSEC) 20 MG capsule Take 20 mg by mouth 2 (two) times daily.        . polyethylene glycol (MIRALAX / GLYCOLAX) packet Take 17 g by mouth daily.        . potassium chloride (KLOR-CON) 20 MEQ packet Take 20  mEq by mouth daily.        . simvastatin (ZOCOR) 20 MG tablet Take 20 mg by mouth daily.         Assessment: 78 yo female from SNF with fever/sob and lethargy over 2 days. Left side pleural effusion. vanc and zosyn for empiric HAP coverage  Goal of Therapy:  Vancomycin trough level 15-20 mcg/ml  Plan: vancomycin 750mg  iv q12h zosyn 3.375 q8h  F/u cx's    Janice Coffin 06/22/2011,5:04 AM

## 2011-06-22 NOTE — ED Provider Notes (Signed)
78yo F, progressive AMS over past several days per family.  CT scan with left pleural effusion and bibasilar atelectasis, no acute intra-abd process.  Afebrile here with VSS.  Admit to [facility], Dr. Fernande Bras 4, tele bed.  Laray Anger, DO 06/22/11 [phone]

## 2011-06-22 NOTE — H&P (Signed)
DATE OF ADMISSION:  06/22/2011  PCP:    Colon Branch, MD, MD   Chief Complaint: SOB and Fever  HPI: [name] is an 78 y.o. female from SNF in [place] with decline over 2 days with fevers and SOB and increased lethargy.  Patient reported as having decreased PO intake over the past 24 hours as well.  She was evaluated in the ED and a Chest X-Ray was performed and found to have a left sided pleural effusion, and her labs revealed a leukocytosis with a WBC count of 27K.  She was started empirically on IV Vancomycin and Zosyn to cover a possible Healthcare Acquired Pneumonia.  Her family was concerned about observed ABD distension, so in the ED ABD X-Rays and a CT scan was performed which were negative for acute findings.    Past Medical History  Diagnosis Date  . Dysphagia   . Diabetes mellitus   . Hypertension   . Hypothyroidism   . Hyperlipemia   . GERD (gastroesophageal reflux disease)   . Depression   . Anxiety   . Acute MI   . Pneumonia   . Stroke   . UTI (urinary tract infection)   . COPD (chronic obstructive pulmonary disease)   . CAD (coronary artery disease)   . Late effects of CVA (cerebrovascular accident)     History reviewed. No pertinent past surgical history.  Medications:  HOME MEDS: Prior to Admission medications   Medication Sig Start Date End Date Taking? Authorizing Provider  ALPRAZolam (XANAX) 0.25 MG tablet Take 0.25 mg by mouth 2 (two) times daily.     Yes Historical Provider, MD  amLODipine (NORVASC) 10 MG tablet Take 10 mg by mouth daily.     Yes Historical Provider, MD  aspirin 81 MG tablet Take 81 mg by mouth daily.     Yes Historical Provider, MD  beta carotene w/minerals (OCUVITE) tablet Take 1 tablet by mouth daily.     Yes Historical Provider, MD  DULoxetine (CYMBALTA) 30 MG capsule Take 30 mg by mouth at bedtime.     Yes Historical Provider, MD  furosemide (LASIX) 20 MG tablet Take 20 mg by mouth daily.     Yes Historical Provider, MD    insulin glargine (LANTUS) 100 UNIT/ML injection Inject 75 Units into the skin at bedtime.    Yes Historical Provider, MD  insulin lispro (HUMALOG) 100 UNIT/ML injection Inject 5 Units into the skin 3 (three) times daily before meals.     Yes Historical Provider, MD  levothyroxine (SYNTHROID, LEVOTHROID) 100 MCG tablet Take 100 mcg by mouth. Takes on Mon,Tues,Wed,Thurs and Fri    Yes Historical Provider, MD  levothyroxine (SYNTHROID, LEVOTHROID) 125 MCG tablet Take 125 mcg by mouth. Takes on Sat and sun    Yes Historical Provider, MD  losartan (COZAAR) 100 MG tablet Take 100 mg by mouth daily.     Yes Historical Provider, MD  magnesium hydroxide (MILK OF MAGNESIA) 400 MG/5ML suspension Take 30 mLs by mouth daily as needed. For constipation   Yes Historical Provider, MD  metFORMIN (GLUCOPHAGE) 500 MG tablet Take 500 mg by mouth 2 (two) times daily with a meal.     Yes Historical Provider, MD  metoprolol (LOPRESSOR) 50 MG tablet Take 50 mg by mouth 2 (two) times daily.     Yes Historical Provider, MD  omeprazole (PRILOSEC) 20 MG capsule Take 20 mg by mouth 2 (two) times daily.     Yes Historical Provider, MD  polyethylene glycol (  MIRALAX / GLYCOLAX) packet Take 17 g by mouth daily.     Yes Historical Provider, MD  potassium chloride (KLOR-CON) 20 MEQ packet Take 20 mEq by mouth daily.     Yes Historical Provider, MD  simvastatin (ZOCOR) 20 MG tablet Take 20 mg by mouth daily.     Yes Historical Provider, MD    Allergies:  No Known Allergies  Social History:   does not have a smoking history on file. She does not have any smokeless tobacco history on file. Her alcohol and drug histories not on file.  Family History: History reviewed. No pertinent family history.  Review of Systems: Positive for Fever. SOB, otherwise Unable to Obtain from the patient.   Physical Exam:  GEN:  Elderly 78 year old  Caucasian  Female  examined  and in no acute distress; cooperative with exam Filed Vitals:    06/21/11 2315 06/21/11 2330 06/21/11 2345 06/22/11 0254  BP: 140/65 101/72 122/44   Pulse: 104 104 107   Temp:      TempSrc:      Resp:      SpO2: 97% 98% 97% 94%   Blood pressure 122/44, pulse 107, temperature 99.6 F (37.6 C), temperature source Oral, resp. rate 21, SpO2 94.00%. PSYCH: She is alert and oriented x1 Drowsy, but arousable, nonverbal at this time HEENT: Normocephalic and Atraumatic, Mucous membranes pink; PERRLA; EOM intact; Fundi:  Benign;  No scleral icterus, Nares: Patent, Oropharynx: Clear, Neck:  FROM, no cervical lymphadenopathy nor thyromegaly or carotid bruit; no JVD; Breasts:: Not examined CHEST WALL: No tenderness CHEST: Decreased Rhonchorous Breath Sounds, No rales No Wheezes.   HEART: Regular rate and rhythm; no murmurs rubs or gallops BACK: No kyphosis or scoliosis; no CVA tenderness ABDOMEN: Positive Bowel Sounds,  Obese, soft non-tender; no masses, no organomegaly.   Rectal Exam: Not done EXTREMITIES:  no cyanosis, clubbing or edema; no ulcerations. Genitalia: not examined PULSES: 2+ and symmetric SKIN: Normal hydration no rash or ulceration CNS: Cranial nerves 2-12 grossly intact no focal neurologic deficit   Labs & Imaging Results for orders placed during the hospital encounter of 06/21/11 (from the past 48 hour(s))  CBC     Status: Abnormal   Collection Time   06/21/11  8:15 PM      Component Value Range Comment   WBC 27.2 (*) 4.0 - 10.5 (K/uL)    RBC 4.67  3.87 - 5.11 (MIL/uL)    Hemoglobin 14.1  12.0 - 15.0 (g/dL)    HCT 19.1  47.8 - 29.5 (%)    MCV 89.3  78.0 - 100.0 (fL)    MCH 30.2  26.0 - 34.0 (pg)    MCHC 33.8  30.0 - 36.0 (g/dL)    RDW 62.1  30.8 - 65.7 (%)    Platelets 211  150 - 400 (K/uL)   DIFFERENTIAL     Status: Abnormal   Collection Time   06/21/11  8:15 PM      Component Value Range Comment   Neutrophils Relative 91 (*) 43 - 77 (%)    Lymphocytes Relative 5 (*) 12 - 46 (%)    Monocytes Relative 3  3 - 12 (%)    Eosinophils  Relative 1  0 - 5 (%)    Basophils Relative 0  0 - 1 (%)    Neutro Abs 24.7 (*) 1.7 - 7.7 (K/uL)    Lymphs Abs 1.4  0.7 - 4.0 (K/uL)    Monocytes Absolute 0.8  0.1 - 1.0 (K/uL)    Eosinophils Absolute 0.3  0.0 - 0.7 (K/uL)    Basophils Absolute 0.0  0.0 - 0.1 (K/uL)    WBC Morphology MILD LEFT SHIFT (1-5% METAS, OCC MYELO, OCC BANDS)     COMPREHENSIVE METABOLIC PANEL     Status: Abnormal   Collection Time   06/21/11  8:15 PM      Component Value Range Comment   Sodium 132 (*) 135 - 145 (mEq/L)    Potassium 4.7  3.5 - 5.1 (mEq/L)    Chloride 95 (*) 96 - 112 (mEq/L)    CO2 25  19 - 32 (mEq/L)    Glucose, Bld 133 (*) 70 - 99 (mg/dL)    BUN 19  6 - 23 (mg/dL)    Creatinine, Ser 5.28  0.50 - 1.10 (mg/dL)    Calcium 8.9  8.4 - 10.5 (mg/dL)    Total Protein 6.7  6.0 - 8.3 (g/dL)    Albumin 3.5  3.5 - 5.2 (g/dL)    AST 24  0 - 37 (U/L)    ALT 20  0 - 35 (U/L)    Alkaline Phosphatase 91  39 - 117 (U/L)    Total Bilirubin 0.7  0.3 - 1.2 (mg/dL)    GFR calc non Af Amer 48 (*) >90 (mL/min)    GFR calc Af Amer 56 (*) >90 (mL/min)   LIPASE, BLOOD     Status: Normal   Collection Time   06/21/11  8:15 PM      Component Value Range Comment   Lipase 17  11 - 59 (U/L)   LACTIC ACID, PLASMA     Status: Normal   Collection Time   06/21/11  8:15 PM      Component Value Range Comment   Lactic Acid, Venous 2.2  0.5 - 2.2 (mmol/L)   TROPONIN I     Status: Normal   Collection Time   06/21/11  8:15 PM      Component Value Range Comment   Troponin I <0.30  <0.30 (ng/mL)   PROTIME-INR     Status: Normal   Collection Time   06/21/11  8:15 PM      Component Value Range Comment   Prothrombin Time 13.5  11.6 - 15.2 (seconds)    INR 1.01  0.00 - 1.49    APTT     Status: Normal   Collection Time   06/21/11  8:15 PM      Component Value Range Comment   aPTT 24  24 - 37 (seconds)   PRO B NATRIURETIC PEPTIDE     Status: Normal   Collection Time   06/21/11  8:15 PM      Component Value Range Comment    Pro B Natriuretic peptide (BNP) 440.3  0 - 450 (pg/mL)   AMMONIA     Status: Normal   Collection Time   06/21/11  8:15 PM      Component Value Range Comment   Ammonia 37  11 - 60 (umol/L)   URINALYSIS, ROUTINE W REFLEX MICROSCOPIC     Status: Abnormal   Collection Time   06/21/11  8:35 PM      Component Value Range Comment   Color, Urine YELLOW  YELLOW     APPearance CLOUDY (*) CLEAR     Specific Gravity, Urine 1.024  1.005 - 1.030     pH 5.5  5.0 - 8.0     Glucose, UA NEGATIVE  NEGATIVE (  mg/dL)    Hgb urine dipstick NEGATIVE  NEGATIVE     Bilirubin Urine NEGATIVE  NEGATIVE     Ketones, ur 15 (*) NEGATIVE (mg/dL)    Protein, ur >454 (*) NEGATIVE (mg/dL)    Urobilinogen, UA 0.2  0.0 - 1.0 (mg/dL)    Nitrite NEGATIVE  NEGATIVE     Leukocytes, UA NEGATIVE  NEGATIVE    URINE MICROSCOPIC-ADD ON     Status: Normal   Collection Time   06/21/11  8:35 PM      Component Value Range Comment   Squamous Epithelial / LPF RARE  RARE     WBC, UA 0-2  <3 (WBC/hpf)    Urine-Other MUCOUS PRESENT   AMORPHOUS URATES/PHOSPHATES  POCT I-STAT 3, BLOOD GAS (G3+)     Status: Abnormal   Collection Time   06/21/11  9:52 PM      Component Value Range Comment   pH, Arterial 7.342 (*) 7.350 - 7.400     pCO2 arterial 52.5 (*) 35.0 - 45.0 (mmHg)    pO2, Arterial 81.0  80.0 - 100.0 (mmHg)    Bicarbonate 28.3 (*) 20.0 - 24.0 (mEq/L)    TCO2 30  0 - 100 (mmol/L)    O2 Saturation 95.0      Acid-Base Excess 2.0  0.0 - 2.0 (mmol/L)    Patient temperature 99.6 F      Collection site RADIAL, ALLEN'S TEST ACCEPTABLE      Drawn by RT      Sample type ARTERIAL      Dg Chest 1 View  06/21/2011  *RADIOLOGY REPORT*  Clinical Data: Wheezing, bilateral rales, hypoxia, and altered mental status.  CHEST - 1 VIEW  Comparison: 02/02/2011  Findings: Shallow inspiration.  Cardiac enlargement with mild prominence of pulmonary vascularity.  This is slightly more prominent than on the previous study may represent early congestive  change.  Peribronchial thickening and streaky opacities suggesting chronic bronchitic changes.  No focal airspace consolidation in the lungs.  No blunting of costophrenic angles.  No pneumothorax. Degenerative changes in the thoracic spine.  The old right rib fracture.  Calcification of the aorta.  IMPRESSION: Shallow inspiration.  Mild cardiac enlargement with prominent pulmonary vascularity suggesting early congestive change.  No focal consolidation.  Original Report Authenticated By: Marlon Pel, M.D.   Ct Abdomen Pelvis W Contrast  06/22/2011  *RADIOLOGY REPORT*  Clinical Data: Abdominal distension.  CT ABDOMEN AND PELVIS WITH CONTRAST  Technique:  Multidetector CT imaging of the abdomen and pelvis was performed following the standard protocol during bolus administration of intravenous contrast.  Contrast: OMNIPAQUE IOHEXOL 300 MG/ML IJ SOLN  Comparison: 04/12/2007  Findings: Respiratory motion artifact in the lung bases.  Small left pleural effusion with basilar atelectasis.  Aortic and coronary artery calcifications.  The liver, spleen, pancreas, adrenal glands, kidneys, and retroperitoneal lymph nodes are unremarkable.  Cholelithiasis. Fairly extensive calcification of the abdominal aorta and visceral branch vessels.  No aneurysm.  Vascular stenosis is not excluded. Contrast flow is demonstrated within the visualized vessels.  The stomach, small bowel, and stool filled colon are decompressed without distension or abnormal wall thickening.  There is diffuse prominence of the visceral adipose tissue with thinning and laxity of the anterior abdominal wall although the hernia is demonstrated. No free air or free fluid in the abdomen.  Pelvis:  Foley catheter decompresses the bladder.  Calcification in the uterus consistent with fibroids.  No abnormal adnexal masses. No free or loculated pelvic  fluid collections.  No significant pelvic lymphadenopathy.  Diffuse degenerative change in the spine. No  destructive bone lesions visualized.  Study is technically limited due to motion artifact.  IMPRESSION: The small left pleural effusion with basilar atelectasis. Cholelithiasis.  Calcified uterine fibroid.  Extensive vascular calcifications.  Findings are similar to the previous study.  No evidence of bowel distension or ascites.  Prominent visceral adipose tissue with laxity of the abdominal wall musculature.  Original Report Authenticated By: Marlon Pel, M.D.   Dg Abd 2 Views  06/21/2011  *RADIOLOGY REPORT*  Clinical Data: Abdominal distention, shortness of breath.  ABDOMEN - 2 VIEW  Comparison: 04/15/2007  Findings: There is a nonobstructive bowel gas pattern.  No free air.  Large calcified fibroid noted in the pelvis.  No organomegaly.  No acute bony abnormality.  Degenerative changes in the lumbar spine and hips.  IMPRESSION: No obstruction or free air.  No acute findings.  Original Report Authenticated By: Cyndie Chime, M.D.      Assessment: Present on Admission:  .Fever .SOB (shortness of breath) .Abdominal  pain, other specified site .Metabolic encephalopathy .Pleural effusion    Plan:    Admit to Telemetry Bed for cardiac monitoring Blood Cultures X 2, IV Vanc and Zosyn, Nebs, O2 Gentle IVFs Reconcile Meds SSI coverage PRN DVT prophylaxis.  Code Status discussed, Full Code for Now, POA Daughter: Mrs. Lin Landsman.   Other plans as per orders.    CODE STATUS:      FULL CODE         [name] C 06/22/2011, 3:49 AM

## 2011-06-23 LAB — COMPREHENSIVE METABOLIC PANEL
CO2: 31 mEq/L (ref 19–32)
Calcium: 8 mg/dL — ABNORMAL LOW (ref 8.4–10.5)
Creatinine, Ser: 1.06 mg/dL (ref 0.50–1.10)
GFR calc Af Amer: 52 mL/min — ABNORMAL LOW (ref >90)
GFR calc non Af Amer: 45 mL/min — ABNORMAL LOW (ref >90)
Glucose, Bld: 190 mg/dL — ABNORMAL HIGH (ref 70–99)

## 2011-06-23 LAB — CBC
Hemoglobin: 11.6 g/dL — ABNORMAL LOW (ref 12.0–15.0)
MCH: 29.1 pg (ref 26.0–34.0)
MCHC: 31.9 g/dL (ref 30.0–36.0)
MCV: 91.5 fL (ref 78.0–100.0)
RBC: 3.98 MIL/uL (ref 3.87–5.11)

## 2011-06-23 LAB — GLUCOSE, CAPILLARY: Glucose-Capillary: 174 mg/dL — ABNORMAL HIGH (ref 70–99)

## 2011-06-23 MED ORDER — ALBUTEROL SULFATE (5 MG/ML) 0.5% IN NEBU: 2.5000 mg | INHALATION_SOLUTION | RESPIRATORY_TRACT | Status: DC | PRN

## 2011-06-23 MED ORDER — HALOPERIDOL LACTATE 5 MG/ML IJ SOLN
2.0000 mg | Freq: Once | INTRAMUSCULAR | Status: AC
  Administered 2011-06-23: 2 mg via INTRAVENOUS

## 2011-06-23 MED ORDER — POTASSIUM CHLORIDE 20 MEQ/15ML (10%) PO LIQD
20.0000 meq | Freq: Once | ORAL | Status: AC
  Administered 2011-06-23: 20 meq via ORAL
  Filled 2011-06-23: qty 15

## 2011-06-23 MED ORDER — GLUCERNA SHAKE PO LIQD
237.0000 mL | Freq: Two times a day (BID) | ORAL | Status: DC
  Administered 2011-06-23 – 2011-06-27 (×7): 237 mL via ORAL

## 2011-06-23 MED ORDER — FUROSEMIDE 10 MG/ML IJ SOLN
20.0000 mg | Freq: Once | INTRAMUSCULAR | Status: AC
Start: 2011-06-23 — End: 2011-06-23
  Administered 2011-06-23: 20 mg via INTRAVENOUS
  Filled 2011-06-23: qty 2

## 2011-06-23 MED ORDER — FUROSEMIDE 10 MG/ML IJ SOLN
20.0000 mg | Freq: Once | INTRAMUSCULAR | Status: AC
  Administered 2011-06-23: 20 mg via INTRAVENOUS
  Filled 2011-06-23: qty 2

## 2011-06-23 MED ORDER — HALOPERIDOL LACTATE 5 MG/ML IJ SOLN
INTRAMUSCULAR | Status: AC
  Administered 2011-06-23: 2 mg via INTRAVENOUS
  Filled 2011-06-23: qty 1

## 2011-06-23 MED ORDER — IPRATROPIUM BROMIDE 0.02 % IN SOLN
0.5000 mg | Freq: Four times a day (QID) | RESPIRATORY_TRACT | Status: DC
  Administered 2011-06-23 – 2011-06-25 (×9): 0.5 mg via RESPIRATORY_TRACT
  Filled 2011-06-23 (×10): qty 2.5

## 2011-06-23 MED ORDER — METHYLPREDNISOLONE SODIUM SUCC 125 MG IJ SOLR
125.0000 mg | Freq: Once | INTRAMUSCULAR | Status: AC
  Administered 2011-06-23: 125 mg via INTRAVENOUS

## 2011-06-23 MED ORDER — MORPHINE SULFATE 2 MG/ML IJ SOLN: 1.0000 mg | Freq: Once | INTRAMUSCULAR | Status: DC

## 2011-06-23 MED ORDER — METHYLPREDNISOLONE SODIUM SUCC 125 MG IJ SOLR
60.0000 mg | Freq: Two times a day (BID) | INTRAMUSCULAR | Status: DC
  Administered 2011-06-23 – 2011-06-24 (×2): 60 mg via INTRAVENOUS
  Filled 2011-06-23: qty 0.96
  Filled 2011-06-23: qty 2
  Filled 2011-06-23: qty 0.96
  Filled 2011-06-23: qty 2

## 2011-06-23 NOTE — Progress Notes (Signed)
Clinical Social Work Department BRIEF PSYCHOSOCIAL ASSESSMENT 06/23/2011  Patient:  [name], [name]     Account Number:  [id number]     Admit date:  06/21/2011  Clinical Social Worker:  Jacelyn Grip  Date/Time:  06/23/2011 03:45 PM  Referred by:  Physician  Date Referred:  06/23/2011 Referred for  SNF Placement   Other Referral:   Interview type:  Family Other interview type:    PSYCHOSOCIAL DATA Living Status:  FACILITY Admitted from facility:  [facility] Level of care:  Skilled Nursing Facility Primary support name:  Jason Fila Hurley/daughter/[phone] Primary support relationship to patient:  CHILD, ADULT Degree of support available:   adequate    CURRENT CONCERNS  Other Concerns:    SOCIAL WORK ASSESSMENT / PLAN CSW met with pt relative at bedside and spoke with pt daughter by phone to discuss pt discharge plans. Per pt family, pt is from [facility] and plan is for pt to return at discharge. Per pt family, pt has had a lot of confusion today and do not anticipate pt will be ready for discharge for a number of days. CSW discussed role and discussed that this CSW meets with MD every morning and will continue to follow pt. CSW contacted [facility] and left message for admission. CSW to facilitate pt discharge needs when pt medically ready for discharge.   Assessment/plan status:  Other - See comment Other assessment/ plan:   discharge planning   Information/referral to community resources:   none at this time    PATIENT'S/FAMILY'S RESPONSE TO PLAN OF CARE: Per chart, pt with altered mental status. Pt is from [facility] and family plans for pt to return there when pt medically stable for discharge.     Jacklynn Lewis, MSW, LCSWA  Clinical Social Work [phone]

## 2011-06-23 NOTE — Progress Notes (Signed)
Inpatient Diabetes Program Recommendations  AACE/ADA: New Consensus Statement on Inpatient Glycemic Control (2009)  Target Ranges:  Prepandial:   less than 140 mg/dL      Peak postprandial:   less than 180 mg/dL (1-2 hours)      Critically ill patients:  140 - 180 mg/dL   Reason for Visit: Hyperglycmia  Results for [name], [name] (MRN [id number]) as of 06/23/2011 10:15  Ref. Range 02/02/2011 07:47 06/22/2011 04:09 06/22/2011 07:42 06/22/2011 11:32 06/22/2011 16:37 06/22/2011 21:19 06/23/2011 01:00 06/23/2011 07:19  Glucose-Capillary Latest Range: 70-99 mg/dL 409 (H) 811 (H) 914 (H) 236 (H) 289 (H) 217 (H) 174 (H) 197 (H)    Inpatient Diabetes Program Recommendations Insulin - Meal Coverage: Pt would benefit from meal coverage insulin - Novolog 3 units tidwc  (Pt is on Novolog 5 units tidwc at SNF)  Note: Will follow.

## 2011-06-23 NOTE — Progress Notes (Addendum)
SUBJECTIVE Feels better today. No complaints.   1. Healthcare-associated pneumonia   2. Altered mental status   3. Dehydration   4. Pleural effusion, left     Past Medical History  Diagnosis Date  . Dysphagia   . Diabetes mellitus   . Hypertension   . Hypothyroidism   . Hyperlipemia   . GERD (gastroesophageal reflux disease)   . Depression   . Anxiety   . Acute MI   . Pneumonia   . Stroke   . UTI (urinary tract infection)   . COPD (chronic obstructive pulmonary disease)   . CAD (coronary artery disease)   . Late effects of CVA (cerebrovascular accident)    Current Facility-Administered Medications  Medication Dose Route Frequency Provider Last Rate Last Dose  . acetaminophen (TYLENOL) tablet 650 mg  650 mg Oral Q6H PRN Ron Parker, MD       Or  . acetaminophen (TYLENOL) suppository 650 mg  650 mg Rectal Q6H PRN Ron Parker, MD      . albuterol (PROVENTIL) (5 MG/ML) 0.5% nebulizer solution 2.5 mg  2.5 mg Nebulization Q6H Ron Parker, MD   2.5 mg at 06/23/11 0134  . albuterol (PROVENTIL) (5 MG/ML) 0.5% nebulizer solution 2.5 mg  2.5 mg Nebulization Q2H PRN Harvette Velora Heckler, MD      . albuterol (PROVENTIL) (5 MG/ML) 0.5% nebulizer solution 2.5 mg  2.5 mg Nebulization Q2H PRN Lavance Beazer, MD      . ALPRAZolam Prudy Feeler) tablet 0.25 mg  0.25 mg Oral BID Ron Parker, MD   0.25 mg at 06/22/11 2156  . alum & mag hydroxide-simeth (MAALOX/MYLANTA) 200-200-20 MG/5ML suspension 30 mL  30 mL Oral Q6H PRN Ron Parker, MD      . amLODipine (NORVASC) tablet 10 mg  10 mg Oral Daily Ron Parker, MD   10 mg at 06/23/11 0925  . aspirin EC tablet 81 mg  81 mg Oral Daily Ron Parker, MD   81 mg at 06/23/11 0925  . beta carotene w/minerals (OCUVITE) tablet 1 tablet  1 tablet Oral Daily Ron Parker, MD   1 tablet at 06/23/11 0925  . calcium gluconate 1 g in sodium chloride 0.9 % 100 mL IVPB  1 g Intravenous Once Devoiry Corriher, MD   1 g at  06/22/11 1009  . dextrose 50 % solution 50 mL  1 ampule Intravenous Once Jaciel Diem, MD   50 mL at 06/22/11 1238  . DULoxetine (CYMBALTA) DR capsule 30 mg  30 mg Oral QHS Ron Parker, MD   30 mg at 06/22/11 2150  . enoxaparin (LOVENOX) injection 30 mg  30 mg Subcutaneous Q24H Anastazja Isaac, MD   30 mg at 06/23/11 0925  . furosemide (LASIX) injection 20 mg  20 mg Intravenous Once Debie Ashline, MD      . HYDROmorphone (DILAUDID) injection 0.5-1 mg  0.5-1 mg Intravenous Q3H PRN Ron Parker, MD      . insulin aspart (novoLOG) injection 0-5 Units  0-5 Units Subcutaneous QHS Ron Parker, MD   2 Units at 06/22/11 2150  . insulin aspart (novoLOG) injection 0-9 Units  0-9 Units Subcutaneous TID WC Ron Parker, MD   2 Units at 06/23/11 [phone]  . insulin aspart (novoLOG) injection 5 Units  5 Units Intravenous Once Teniya Filter, MD   5 Units at 06/22/11 1010  . insulin glargine (LANTUS) injection 75 Units  75 Units Subcutaneous QHS Harvette C Jenkins,  MD      . ipratropium (ATROVENT) nebulizer solution 0.5 mg  0.5 mg Nebulization Q6H Aalijah Lanphere, MD      . levothyroxine (SYNTHROID, LEVOTHROID) tablet 100 mcg  100 mcg Oral QAC breakfast Ron Parker, MD   100 mcg at 06/23/11 0836  . methylPREDNISolone sodium succinate (SOLU-MEDROL) 125 mg/2 mL injection 125 mg  125 mg Intravenous Once Savian Mazon, MD      . methylPREDNISolone sodium succinate (SOLU-MEDROL) 125 mg/2 mL injection 60 mg  60 mg Intravenous Q12H Celedonio Sortino, MD      . ondansetron (ZOFRAN) tablet 4 mg  4 mg Oral Q6H PRN Ron Parker, MD       Or  . ondansetron (ZOFRAN) injection 4 mg  4 mg Intravenous Q6H PRN Harvette Velora Heckler, MD      . oxyCODONE (Oxy IR/ROXICODONE) immediate release tablet 5 mg  5 mg Oral Q4H PRN Ron Parker, MD      . pantoprazole (PROTONIX) EC tablet 40 mg  40 mg Oral Q1200 Ron Parker, MD   40 mg at 06/22/11 1247  . piperacillin-tazobactam (ZOSYN) IVPB 3.375 g   3.375 g Intravenous Q8H Eliyanna Ault, MD   3.375 g at 06/23/11 0559  . polyethylene glycol (MIRALAX / GLYCOLAX) packet 17 g  17 g Oral Daily Ron Parker, MD   17 g at 06/23/11 0924  . sodium polystyrene (KAYEXALATE) 15 GM/60ML suspension 30 g  30 g Oral Once Lochlin Eppinger, MD   30 g at 06/22/11 1045  . vancomycin (VANCOCIN) 750 mg in sodium chloride 0.9 % 150 mL IVPB  750 mg Intravenous Q12H Dayrin Stallone, MD   750 mg at 06/23/11 0302  . DISCONTD: 0.9 %  sodium chloride infusion   Intravenous Continuous Sharene Krikorian, MD 75 mL/hr at 06/22/11 2041    . DISCONTD: metoprolol (LOPRESSOR) tablet 50 mg  50 mg Oral BID Ron Parker, MD   50 mg at 06/23/11 0925   No Known Allergies Principal Problem:  *Fever Active Problems:  SOB (shortness of breath)  Abdominal  pain, other specified site  Metabolic encephalopathy  Pleural effusion   Vital signs in last 24 hours: Temp:  [97.1 F (36.2 C)-100.3 F (37.9 C)] 98.8 F (37.1 C) (04/01 0500) Pulse Rate:  [63-98] 63  (04/01 0500) Resp:  [17-20] 20  (04/01 0500) BP: (135-176)/(51-68) 154/51 mmHg (04/01 0500) SpO2:  [93 %-100 %] 93 % (04/01 0500) Weight change:     Intake/Output from previous day: 03/31 0701 - 04/01 0700 In: 1895 [P.O.:420; I.V.:975; IV Piggyback:500] Out: 1450 [Urine:1450] Intake/Output this shift:    Lab Results:  Basename 06/23/11 0600 06/22/11 0650  WBC 15.6* 20.2*  HGB 11.6* 12.8  HCT 36.4 38.8  PLT 147* 166   BMET  Basename 06/23/11 0600 06/22/11 0650  NA 136 133*  K 4.2 6.1*  CL 100 100  CO2 31 28  GLUCOSE 190* 276*  BUN 20 21  CREATININE 1.06 1.11*  CALCIUM 8.0* 8.4    Studies/Results: Dg Chest 1 View  06/21/2011  *RADIOLOGY REPORT*  Clinical Data: Wheezing, bilateral rales, hypoxia, and altered mental status.  CHEST - 1 VIEW  Comparison: 02/02/2011  Findings: Shallow inspiration.  Cardiac enlargement with mild prominence of pulmonary vascularity.  This is slightly more prominent than  on the previous study may represent early congestive change.  Peribronchial thickening and streaky opacities suggesting chronic bronchitic changes.  No focal airspace consolidation in the lungs.  No  blunting of costophrenic angles.  No pneumothorax. Degenerative changes in the thoracic spine.  The old right rib fracture.  Calcification of the aorta.  IMPRESSION: Shallow inspiration.  Mild cardiac enlargement with prominent pulmonary vascularity suggesting early congestive change.  No focal consolidation.  Original Report Authenticated By: Marlon Pel, M.D.   Ct Abdomen Pelvis W Contrast  06/22/2011  *RADIOLOGY REPORT*  Clinical Data: Abdominal distension.  CT ABDOMEN AND PELVIS WITH CONTRAST  Technique:  Multidetector CT imaging of the abdomen and pelvis was performed following the standard protocol during bolus administration of intravenous contrast.  Contrast: OMNIPAQUE IOHEXOL 300 MG/ML IJ SOLN  Comparison: 04/12/2007  Findings: Respiratory motion artifact in the lung bases.  Small left pleural effusion with basilar atelectasis.  Aortic and coronary artery calcifications.  The liver, spleen, pancreas, adrenal glands, kidneys, and retroperitoneal lymph nodes are unremarkable.  Cholelithiasis. Fairly extensive calcification of the abdominal aorta and visceral branch vessels.  No aneurysm.  Vascular stenosis is not excluded. Contrast flow is demonstrated within the visualized vessels.  The stomach, small bowel, and stool filled colon are decompressed without distension or abnormal wall thickening.  There is diffuse prominence of the visceral adipose tissue with thinning and laxity of the anterior abdominal wall although the hernia is demonstrated. No free air or free fluid in the abdomen.  Pelvis:  Foley catheter decompresses the bladder.  Calcification in the uterus consistent with fibroids.  No abnormal adnexal masses. No free or loculated pelvic fluid collections.  No significant pelvic  lymphadenopathy.  Diffuse degenerative change in the spine. No destructive bone lesions visualized.  Study is technically limited due to motion artifact.  IMPRESSION: The small left pleural effusion with basilar atelectasis. Cholelithiasis.  Calcified uterine fibroid.  Extensive vascular calcifications.  Findings are similar to the previous study.  No evidence of bowel distension or ascites.  Prominent visceral adipose tissue with laxity of the abdominal wall musculature.  Original Report Authenticated By: Marlon Pel, M.D.   Dg Abd 2 Views  06/21/2011  *RADIOLOGY REPORT*  Clinical Data: Abdominal distention, shortness of breath.  ABDOMEN - 2 VIEW  Comparison: 04/15/2007  Findings: There is a nonobstructive bowel gas pattern.  No free air.  Large calcified fibroid noted in the pelvis.  No organomegaly.  No acute bony abnormality.  Degenerative changes in the lumbar spine and hips.  IMPRESSION: No obstruction or free air.  No acute findings.  Original Report Authenticated By: Cyndie Chime, M.D.    Medications: I have reviewed the patient's current medications.   Physical exam GENERAL- alert. Audibly wheezing. HEAD- normal atraumatic, no neck masses, normal thyroid, no jvd RESPIRATORY- appears well, vitals normal, no respiratory distress, acyanotic, normal RR, ear and throat exam is normal, neck free of mass or lymphadenopathy, chest clear, no wheezing, crepitations, rhonchi, normal symmetric air entry CVS- regular rate and rhythm, S1, S2 normal, no murmur, click, rub or gallop ABDOMEN- abdomen is soft without significant tenderness, masses, organomegaly or guarding NEURO- Grossly normal EXTREMITIES- extremities normal, atraumatic, no cyanosis or edema  Plan   *Fever/ SOB (shortness of breath)/HCAP/wheezing, possible acute exacerbation of COPD/Pleural effusion - clinically better mentation wise. Wbc trending down, afebrile. Continue current abx, add solumedrol, atrovent, give  Lasix  dose/d/c ivf.  *Abdominal  pain, other specified site- better, may be related to constipation. *Metabolic encephalopathy- due to infection. Resolved. *dvt/gi prophylaxis *Advance directives-talked to patient's family yesterday, including her grand daughter who is power of attorney. Patient has a living will  and she is DNR/DNI, order in chart.    [name] 06/23/2011 10:05 AM Pager: [phone].   Ms Noyes quite agitated after transfer from telemetry. She is pulling out ivs. May be related to steroid psychosis. Will give a trial of haldol/morphine/lasix- still some respiratory distress and responding to lasix dose given earlier, now less wheezing.

## 2011-06-23 NOTE — Progress Notes (Signed)
INITIAL ADULT NUTRITION ASSESSMENT Date: 06/23/2011   Time: 12:21 PM  Reason for Assessment: Nutrition Risk Report, Low Braden  ASSESSMENT: Female 78 y.o.  Dx: Fever  Hx:  Past Medical History  Diagnosis Date  . Dysphagia   . Diabetes mellitus   . Hypertension   . Hypothyroidism   . Hyperlipemia   . GERD (gastroesophageal reflux disease)   . Depression   . Anxiety   . Acute MI   . Pneumonia   . Stroke   . UTI (urinary tract infection)   . COPD (chronic obstructive pulmonary disease)   . CAD (coronary artery disease)   . Late effects of CVA (cerebrovascular accident)     Related Meds:     . albuterol  2.5 mg Nebulization Q6H  . ALPRAZolam  0.25 mg Oral BID  . amLODipine  10 mg Oral Daily  . aspirin EC  81 mg Oral Daily  . beta carotene w/minerals  1 tablet Oral Daily  . dextrose  1 ampule Intravenous Once  . DULoxetine  30 mg Oral QHS  . enoxaparin  30 mg Subcutaneous Q24H  . furosemide  20 mg Intravenous Once  . insulin aspart  0-5 Units Subcutaneous QHS  . insulin aspart  0-9 Units Subcutaneous TID WC  . insulin glargine  75 Units Subcutaneous QHS  . ipratropium  0.5 mg Nebulization Q6H  . levothyroxine  100 mcg Oral QAC breakfast  . methylPREDNISolone (SOLU-MEDROL) injection  125 mg Intravenous Once  . methylPREDNISolone (SOLU-MEDROL) injection  60 mg Intravenous Q12H  . pantoprazole  40 mg Oral Q1200  . piperacillin-tazobactam (ZOSYN)  IV  3.375 g Intravenous Q8H  . polyethylene glycol  17 g Oral Daily  . vancomycin  750 mg Intravenous Q12H  . DISCONTD: metoprolol  50 mg Oral BID    Ht: 5\' 9"  (175.3 cm)  Wt: 189 lb (85.73 kg)  Ideal Wt: 65.9 kg % Ideal Wt: 131%  Usual Wt: unable to obtain % Usual Wt: ---  Body mass index is 27.91 kg/(m^2).  Food/Nutrition Related Hx: problems chewing or swallowing foods and/or liquids per admission nutrition screen  Labs:  CMP     Component Value Date/Time   NA 136 06/23/2011 0600   K 4.2 06/23/2011 0600   CL  100 06/23/2011 0600   CO2 31 06/23/2011 0600   GLUCOSE 190* 06/23/2011 0600   BUN 20 06/23/2011 0600   CREATININE 1.06 06/23/2011 0600   CALCIUM 8.0* 06/23/2011 0600   PROT 5.8* 06/23/2011 0600   ALBUMIN 2.8* 06/23/2011 0600   AST 21 06/23/2011 0600   ALT 21 06/23/2011 0600   ALKPHOS 72 06/23/2011 0600   BILITOT 0.7 06/23/2011 0600   GFRNONAA 45* 06/23/2011 0600   GFRAA 52* 06/23/2011 0600     Intake/Output Summary (Last 24 hours) at 06/23/11 1223 Last data filed at 06/23/11 0600  Gross per 24 hour  Intake   1895 ml  Output   1450 ml  Net    445 ml    CBG (last 3)   Basename 06/23/11 1123 06/23/11 0719 06/23/11 0100  GLUCAP 214* 197* 174*    Diet Order: Carbohydrate Modified Medium Calorie  Supplements/Tube Feeding: N/A  IVF:    DISCONTD: sodium chloride Last Rate: 75 mL/hr at 06/22/11 2041    Estimated Nutritional Needs:   Kcal: 1700-1900 Protein: 80-90 gm Fluid: 1.7-1.9 L  RD unable to obtain nutrition hx from pt -- per H&P pt is from SNF with decline over 2 days  with fevers, SOB and increased lethargy; has had decreased PO intake over the past 24 hours; pt with history of dysphagia; per RN, tolerating current diet consistencies; PO intake poor at 0-35% per flowsheet records; at risk for skin breakdown given low braden score; would benefit from addition of nutrition supplementation -- RD to order  NUTRITION DIAGNOSIS: -Inadequate oral intake (NI-2.1).  Status: Ongoing  RELATED TO: poor appetite  AS EVIDENCE BY: PO intake 0-35%  MONITORING/EVALUATION(Goals): Goal: meet >90% of estimated nutrition needs to optimize nutritional status Monitor: PO intake, labs, weight, I/O's  EDUCATION NEEDS: -No education needs identified at this time  INTERVENTION:  Consider speech path consult for swallow evaluation  Add Glucerna Shake PO BID (220 kcals, 9.9 gm protein per 8 fl oz bottle)  RD to follow for nutrition care plan  Dietitian #: [phone]  DOCUMENTATION CODES Per approved  criteria  -Not Applicable    Alger Memos 06/23/2011, 12:21 PM

## 2011-06-23 NOTE — Progress Notes (Signed)
I agree with assessments and medication admin done by Shary Key student.

## 2011-06-23 NOTE — Progress Notes (Signed)
06/23/2011 [name] [facility]AR[facility] Case Management Note [phone]  Utilization review completed.  

## 2011-06-24 LAB — CBC
MCH: 29.8 pg (ref 26.0–34.0)
MCHC: 32.9 g/dL (ref 30.0–36.0)
MCV: 90.8 fL (ref 78.0–100.0)
Platelets: 142 10*3/uL — ABNORMAL LOW (ref 150–400)
RBC: 3.82 MIL/uL — ABNORMAL LOW (ref 3.87–5.11)
RDW: 14.3 % (ref 11.5–15.5)

## 2011-06-24 LAB — GLUCOSE, CAPILLARY
Glucose-Capillary: 368 mg/dL — ABNORMAL HIGH (ref 70–99)
Glucose-Capillary: 292 mg/dL — ABNORMAL HIGH (ref 70–99)
Glucose-Capillary: 325 mg/dL — ABNORMAL HIGH (ref 70–99)
Glucose-Capillary: 353 mg/dL — ABNORMAL HIGH (ref 70–99)

## 2011-06-24 LAB — COMPREHENSIVE METABOLIC PANEL
AST: 14 U/L (ref 0–37)
CO2: 30 mEq/L (ref 19–32)
Calcium: 8.1 mg/dL — ABNORMAL LOW (ref 8.4–10.5)
Creatinine, Ser: 1.06 mg/dL (ref 0.50–1.10)
GFR calc non Af Amer: 45 mL/min — ABNORMAL LOW (ref >90)

## 2011-06-24 MED ORDER — FUROSEMIDE 20 MG PO TABS
20.0000 mg | ORAL_TABLET | Freq: Every day | ORAL | Status: DC
  Administered 2011-06-24 – 2011-06-28 (×5): 20 mg via ORAL
  Filled 2011-06-24 (×5): qty 1

## 2011-06-24 MED ORDER — INSULIN GLARGINE 100 UNIT/ML ~~LOC~~ SOLN
78.0000 [IU] | Freq: Every day | SUBCUTANEOUS | Status: DC
  Administered 2011-06-24 – 2011-06-26 (×3): 78 [IU] via SUBCUTANEOUS

## 2011-06-24 MED ORDER — LOSARTAN POTASSIUM 50 MG PO TABS
100.0000 mg | ORAL_TABLET | Freq: Every day | ORAL | Status: DC
  Administered 2011-06-24 – 2011-06-28 (×5): 100 mg via ORAL
  Filled 2011-06-24 (×5): qty 2

## 2011-06-24 MED ORDER — PREDNISONE 20 MG PO TABS
40.0000 mg | ORAL_TABLET | Freq: Every day | ORAL | Status: DC
  Administered 2011-06-24 – 2011-06-28 (×5): 40 mg via ORAL
  Filled 2011-06-24 (×6): qty 2

## 2011-06-24 MED ORDER — METOPROLOL TARTRATE 50 MG PO TABS
50.0000 mg | ORAL_TABLET | Freq: Two times a day (BID) | ORAL | Status: DC
  Administered 2011-06-24 – 2011-06-28 (×8): 50 mg via ORAL
  Filled 2011-06-24 (×12): qty 1

## 2011-06-24 NOTE — Progress Notes (Signed)
CSW reviewed note which stated patient is from [name]. Per MD, patient should be ready to discharge in 1 to 2 days. CSW called SNF who stated patient can return at dc. CSW will continue to follow to assist with dc needs. [place], [place] [phone] Coverage for [employer]

## 2011-06-24 NOTE — Progress Notes (Addendum)
[name] is a 78 y.o. female admitted on 06/22/11 with complaints of SOB and fever, associated with altered mental status. She also had some abdominal distension. Her work up showed wbc off 16109, a small left pleural effusion with bibasilar atelectasis, as well as cholelithiasis. Urine culture and blood culture show no growth so far. She was also wheezing at admission but responded to broad spectrum abx, steroids, bronchodilators, oxygen. Her wbc has improved to 9000. She has some acute kidney injury which improved also. She developed some psychosis after first dose of solumedrol(likely steroid induced, and she has now at baseline mentation wise). Her sugars have been poorly controlled, likely due to the steroids. She is being transitioned to prednisone, and hopefully oral abx in next day or 2. She is DNR. Should return to SNF at d/c.  SUBJECTIVE No complaints today.   1. Healthcare-associated pneumonia   2. Altered mental status   3. Dehydration   4. Pleural effusion, left     Past Medical History  Diagnosis Date  . Dysphagia   . Diabetes mellitus   . Hypertension   . Hypothyroidism   . Hyperlipemia   . GERD (gastroesophageal reflux disease)   . Depression   . Anxiety   . Acute MI   . Pneumonia   . Stroke   . UTI (urinary tract infection)   . COPD (chronic obstructive pulmonary disease)   . CAD (coronary artery disease)   . Late effects of CVA (cerebrovascular accident)    Current Facility-Administered Medications  Medication Dose Route Frequency Provider Last Rate Last Dose  . acetaminophen (TYLENOL) tablet 650 mg  650 mg Oral Q6H PRN Ron Parker, MD       Or  . acetaminophen (TYLENOL) suppository 650 mg  650 mg Rectal Q6H PRN Ron Parker, MD      . albuterol (PROVENTIL) (5 MG/ML) 0.5% nebulizer solution 2.5 mg  2.5 mg Nebulization Q6H Ron Parker, MD   2.5 mg at 06/24/11 6045  . albuterol (PROVENTIL) (5 MG/ML) 0.5% nebulizer solution 2.5 mg  2.5 mg  Nebulization Q2H PRN Harvette Velora Heckler, MD      . albuterol (PROVENTIL) (5 MG/ML) 0.5% nebulizer solution 2.5 mg  2.5 mg Nebulization Q2H PRN Neils Siracusa, MD      . ALPRAZolam Prudy Feeler) tablet 0.25 mg  0.25 mg Oral BID Ron Parker, MD   0.25 mg at 06/24/11 1020  . alum & mag hydroxide-simeth (MAALOX/MYLANTA) 200-200-20 MG/5ML suspension 30 mL  30 mL Oral Q6H PRN Ron Parker, MD      . amLODipine (NORVASC) tablet 10 mg  10 mg Oral Daily Ron Parker, MD   10 mg at 06/24/11 1020  . aspirin EC tablet 81 mg  81 mg Oral Daily Ron Parker, MD   81 mg at 06/24/11 1021  . beta carotene w/minerals (OCUVITE) tablet 1 tablet  1 tablet Oral Daily Ron Parker, MD   1 tablet at 06/24/11 1021  . DULoxetine (CYMBALTA) DR capsule 30 mg  30 mg Oral QHS Ron Parker, MD   30 mg at 06/23/11 2228  . enoxaparin (LOVENOX) injection 30 mg  30 mg Subcutaneous Q24H Yasin Ducat, MD   30 mg at 06/24/11 1021  . feeding supplement (GLUCERNA SHAKE) liquid 237 mL  237 mL Oral BID BM Ailene Ards, RD   237 mL at 06/24/11 1021  . furosemide (LASIX) injection 20 mg  20 mg Intravenous Once Cerise Lieber,  MD   20 mg at 06/23/11 1613  . haloperidol lactate (HALDOL) injection 2 mg  2 mg Intravenous Once Joycelin Radloff, MD   2 mg at 06/23/11 1515  . HYDROmorphone (DILAUDID) injection 0.5-1 mg  0.5-1 mg Intravenous Q3H PRN Ron Parker, MD      . insulin aspart (novoLOG) injection 0-5 Units  0-5 Units Subcutaneous QHS Ron Parker, MD   3 Units at 06/23/11 2237  . insulin aspart (novoLOG) injection 0-9 Units  0-9 Units Subcutaneous TID WC Ron Parker, MD   5 Units at 06/24/11 [phone]  . insulin glargine (LANTUS) injection 75 Units  75 Units Subcutaneous QHS Ron Parker, MD   75 Units at 06/23/11 2238  . ipratropium (ATROVENT) nebulizer solution 0.5 mg  0.5 mg Nebulization Q6H Marvina Danner, MD   0.5 mg at 06/24/11 9604  . levothyroxine (SYNTHROID, LEVOTHROID) tablet  100 mcg  100 mcg Oral QAC breakfast Ron Parker, MD   100 mcg at 06/24/11 0811  . methylPREDNISolone sodium succinate (SOLU-MEDROL) 125 mg/2 mL injection 60 mg  60 mg Intravenous Q12H Blondell Laperle, MD   60 mg at 06/24/11 1022  . morphine 2 MG/ML injection 1 mg  1 mg Intravenous Once Jocee Kissick, MD      . ondansetron (ZOFRAN) tablet 4 mg  4 mg Oral Q6H PRN Ron Parker, MD       Or  . ondansetron (ZOFRAN) injection 4 mg  4 mg Intravenous Q6H PRN Ron Parker, MD      . oxyCODONE (Oxy IR/ROXICODONE) immediate release tablet 5 mg  5 mg Oral Q4H PRN Ron Parker, MD   5 mg at 06/24/11 0837  . pantoprazole (PROTONIX) EC tablet 40 mg  40 mg Oral Q1200 Ron Parker, MD   40 mg at 06/23/11 1207  . piperacillin-tazobactam (ZOSYN) IVPB 3.375 g  3.375 g Intravenous Q8H Rosetta Rupnow, MD   3.375 g at 06/24/11 0552  . polyethylene glycol (MIRALAX / GLYCOLAX) packet 17 g  17 g Oral Daily Ron Parker, MD   17 g at 06/24/11 1021  . potassium chloride 20 MEQ/15ML (10%) liquid 20 mEq  20 mEq Oral Once Dmario Russom, MD   20 mEq at 06/23/11 1613  . vancomycin (VANCOCIN) 750 mg in sodium chloride 0.9 % 150 mL IVPB  750 mg Intravenous Q12H Antania Hoefling, MD   750 mg at 06/24/11 0041   No Known Allergies Principal Problem:  *Fever Active Problems:  SOB (shortness of breath)  Abdominal  pain, other specified site  Metabolic encephalopathy  Pleural effusion   Vital signs in last 24 hours: Temp:  [98.1 F (36.7 C)-99.5 F (37.5 C)] 98.1 F (36.7 C) (04/02 0917) Pulse Rate:  [83-96] 96  (04/02 0917) Resp:  [20-21] 20  (04/02 0917) BP: (156-179)/(53-75) 156/70 mmHg (04/02 0917) SpO2:  [89 %-98 %] 89 % (04/02 0917) Weight change:     Intake/Output from previous day: 04/01 0701 - 04/02 0700 In: 100 [IV Piggyback:100] Out: 1600 [Urine:1600] Intake/Output this shift:    Lab Results:  Basename 06/24/11 0650 06/23/11 0600  WBC 9.9 15.6*  HGB 11.4* 11.6*  HCT  34.7* 36.4  PLT 142* 147*   BMET  Basename 06/24/11 0650 06/23/11 0600  NA 134* 136  K 3.8 4.2  CL 94* 100  CO2 30 31  GLUCOSE 283* 190*  BUN 25* 20  CREATININE 1.06 1.06  CALCIUM 8.1* 8.0*    Studies/Results: No  results found.  Medications: I have reviewed the patient's current medications.   Physical exam GENERAL- alert HEAD- normal atraumatic, no neck masses, normal thyroid, no jvd RESPIRATORY- appears well, vitals normal, no respiratory distress, acyanotic, normal RR, ear and throat exam is normal, neck free of mass or lymphadenopathy, chest clear, no wheezing, crepitations, rhonchi, normal symmetric air entry CVS- regular rate and rhythm, S1, S2 normal, no murmur, click, rub or gallop ABDOMEN- abdomen is soft without significant tenderness, masses, organomegaly or guarding NEURO- Grossly normal EXTREMITIES- extremities normal, atraumatic, no cyanosis or edema  Plan  *Fever/ SOB (shortness of breath)/HCAP/wheezing, possible acute exacerbation of COPD/Pleural effusion  - clinically better mentation wise. Wbc now normal, afebrile. Continue current abx. Switch to oral in next day or 2? *Abdominal pain, other specified site- better, may be related to constipation.  *Metabolic encephalopathy- due to infection. Resolved. *DM2- uncontrolled. Increase lantus. Continue hold metformin given recent contrast/arf. Expect stabilization with steroid taper. *Htn- uncontrolled- resume losartan/lasix/lopressor. Follow renal fxn.  *dvt/gi prophylaxis  *Advance directives-talked to patient's family, including her grand daughter who is power of attorney. Patient has a living will and she is DNR/DNI, order in chart.    Amear Strojny 06/24/2011 11:41 AM Pager: [phone].

## 2011-06-24 NOTE — Progress Notes (Signed)
ANTIBIOTIC CONSULT NOTE - FOLLOW UP  Pharmacy Consult for : Vancomycin, Zosyn Indication: HAP  No Known Allergies  Patient Measurements: Height: 5\' 9"  (175.3 cm) Weight: 189 lb (85.73 kg) IBW/kg (Calculated) : 66.2    Vital Signs: Temp: 98.1 F (36.7 C) (04/02 0917) Temp src: Oral (04/02 0917) BP: 156/70 mmHg (04/02 0917) Pulse Rate: 96  (04/02 0917)  Intake/Output from previous day: 04/01 0701 - 04/02 0700 In: 100 [IV Piggyback:100] Out: 1600 [Urine:1600]  Intake/Output from this shift:    Labs:  Basename 06/24/11 0650 06/23/11 0600 06/22/11 0650  WBC 9.9 15.6* 20.2*  HGB 11.4* 11.6* 12.8  PLT 142* 147* 166  LABCREA -- -- --  CREATININE 1.06 1.06 1.11*   Estimated Creatinine Clearance: 41.2 ml/min (by C-G formula based on Cr of 1.06).  Microbiology: Recent Results (from the past 720 hour(s))  CULTURE, BLOOD (ROUTINE X 2)     Status: Normal (Preliminary result)   Collection Time   06/21/11  8:08 PM      Component Value Range Status Comment   Specimen Description BLOOD RIGHT HAND   Final    Special Requests BOTTLES DRAWN AEROBIC ONLY 5CC   Final    Culture  Setup Time [id number]   Final    Culture     Final    Value:        BLOOD CULTURE RECEIVED NO GROWTH TO DATE CULTURE WILL BE HELD FOR 5 DAYS BEFORE ISSUING A FINAL NEGATIVE REPORT   Report Status PENDING   Incomplete   CULTURE, BLOOD (ROUTINE X 2)     Status: Normal (Preliminary result)   Collection Time   06/21/11  8:14 PM      Component Value Range Status Comment   Specimen Description BLOOD LEFT ARM   Final    Special Requests BOTTLES DRAWN AEROBIC ONLY 10CC   Final    Culture  Setup Time [id number]   Final    Culture     Final    Value:        BLOOD CULTURE RECEIVED NO GROWTH TO DATE CULTURE WILL BE HELD FOR 5 DAYS BEFORE ISSUING A FINAL NEGATIVE REPORT   Report Status PENDING   Incomplete   URINE CULTURE     Status: Normal   Collection Time   06/21/11  8:35 PM      Component Value Range Status  Comment   Specimen Description URINE, CATHETERIZED   Final    Special Requests NONE   Final    Culture  Setup Time [id number]   Final    Colony Count NO GROWTH   Final    Culture NO GROWTH   Final    Report Status 06/22/2011 FINAL   Final   MRSA PCR SCREENING     Status: Normal   Collection Time   06/22/11  4:30 AM      Component Value Range Status Comment   MRSA by PCR NEGATIVE  NEGATIVE  Final     Anti-infectives     Start     Dose/Rate Route Frequency Ordered Stop   06/22/11 1200   vancomycin (VANCOCIN) 750 mg in sodium chloride 0.9 % 150 mL IVPB        750 mg 150 mL/hr over 60 Minutes Intravenous Every 12 hours 06/22/11 0513     06/22/11 0600  piperacillin-tazobactam (ZOSYN) IVPB 3.375 g       3.375 g 12.5 mL/hr over 240 Minutes Intravenous 3 times per day 06/22/11 0513  06/21/11 2245  piperacillin-tazobactam (ZOSYN) IVPB 4.5 g       4.5 g 200 mL/hr over 30 Minutes Intravenous  Once 06/21/11 2235 06/22/11 0110   06/21/11 2245   vancomycin (VANCOCIN) IVPB 1000 mg/200 mL premix        1,000 mg 200 mL/hr over 60 Minutes Intravenous  Once 06/21/11 2235 06/21/11 2348          Assessment:  Day # 3 Vancomycin and Zosyn for presumed HAP in patient with a history of MRSA bacteremia ('09).  Cultures NGTD.  MRSA PCR negative.  WBC down 27.2 >>20.2 >> 15.6 >> 9.9.  Afebrile.  Vancomycin trough 15 mcg/ml.  Renal function stable.  Goal of Therapy:   Vancomycin trough level 10-15 mcg/ml  Plan:   Continue Vancomycin and Zosyn at current doses.   Rodell Marrs, Elisha Headland, Pharm.D. 06/24/2011 12:49 PM

## 2011-06-25 LAB — COMPREHENSIVE METABOLIC PANEL
CO2: 33 mEq/L — ABNORMAL HIGH (ref 19–32)
Calcium: 8.1 mg/dL — ABNORMAL LOW (ref 8.4–10.5)
Creatinine, Ser: 0.98 mg/dL (ref 0.50–1.10)
GFR calc Af Amer: 57 mL/min — ABNORMAL LOW (ref >90)
GFR calc non Af Amer: 49 mL/min — ABNORMAL LOW (ref >90)
Glucose, Bld: 220 mg/dL — ABNORMAL HIGH (ref 70–99)

## 2011-06-25 LAB — GLUCOSE, CAPILLARY
Glucose-Capillary: 192 mg/dL — ABNORMAL HIGH (ref 70–99)
Glucose-Capillary: 315 mg/dL — ABNORMAL HIGH (ref 70–99)

## 2011-06-25 LAB — CBC
HCT: 35.2 % — ABNORMAL LOW (ref 36.0–46.0)
Hemoglobin: 11.4 g/dL — ABNORMAL LOW (ref 12.0–15.0)
MCH: 29.6 pg (ref 26.0–34.0)
MCV: 91.4 fL (ref 78.0–100.0)
RBC: 3.85 MIL/uL — ABNORMAL LOW (ref 3.87–5.11)

## 2011-06-25 MED ORDER — BISACODYL 5 MG PO TBEC
5.0000 mg | DELAYED_RELEASE_TABLET | Freq: Once | ORAL | Status: AC
  Administered 2011-06-25: 5 mg via ORAL
  Filled 2011-06-25: qty 1

## 2011-06-25 MED ORDER — IPRATROPIUM BROMIDE 0.02 % IN SOLN
0.5000 mg | RESPIRATORY_TRACT | Status: DC | PRN
  Administered 2011-06-26: 0.5 mg via RESPIRATORY_TRACT
  Filled 2011-06-25: qty 2.5

## 2011-06-25 MED ORDER — HYDRALAZINE HCL 20 MG/ML IJ SOLN
5.0000 mg | INTRAMUSCULAR | Status: DC | PRN
  Administered 2011-06-25 – 2011-06-26 (×2): 5 mg via INTRAVENOUS
  Filled 2011-06-25: qty 0.25

## 2011-06-25 MED ORDER — LEVOFLOXACIN 500 MG PO TABS
500.0000 mg | ORAL_TABLET | Freq: Every day | ORAL | Status: DC
  Administered 2011-06-26 – 2011-06-28 (×3): 500 mg via ORAL
  Filled 2011-06-25 (×3): qty 1

## 2011-06-25 NOTE — Progress Notes (Signed)
Patient came in for shortness of breath and fever. Upon arrival she was wheezing and having difficulty breathing. She does have a history of COPD. Currently she is clear and in no distress. Her oxygen saturation is 95% on 2 liters. I will change her nebulizers to PRN per protocol.

## 2011-06-25 NOTE — Progress Notes (Signed)
Subjective:  no new c/o today   Objective: Vital signs in last 24 hours: Filed Vitals:   06/24/11 2143 06/25/11 0200 06/25/11 0551 06/25/11 0809  BP: 169/67 167/65 175/78   Pulse: 78 69 70   Temp: 98.4 F (36.9 C) 98.2 F (36.8 C) 97.9 F (36.6 C)   TempSrc:  Oral Axillary   Resp: 20 20 21    Height:      Weight:      SpO2: 97% 98% 99% 98%   Weight change:   Intake/Output Summary (Last 24 hours) at 06/25/11 1014 Last data filed at 06/25/11 0800  Gross per 24 hour  Intake    240 ml  Output   1000 ml  Net   -760 ml    Physical Exam: General: Awake, No acute distress. HEENT: EOMI. Neck: Supple CV: S1 and S2, rrr Lungs: Clear to ascultation bilaterally, no wheezing Abdomen: Soft, Nontender, Nondistended, +bowel sounds. Ext: Good pulses. Trace edema.   Lab Results:  [facility] 06/25/11 0616 06/24/11 0650  NA 136 134*  K 3.7 3.8  CL 96 94*  CO2 33* 30  GLUCOSE 220* 283*  BUN 29* 25*  CREATININE 0.98 1.06  CALCIUM 8.1* 8.1*  MG -- --  PHOS -- --    Basename 06/25/11 0616 06/24/11 0650  AST 13 14  ALT 22 22  ALKPHOS 68 77  BILITOT 0.5 0.7  PROT 5.7* 5.9*  ALBUMIN 2.6* 2.7*   No results found for this basename: LIPASE:2,AMYLASE:2 in the last 72 hours  Basename 06/25/11 0616 06/24/11 0650  WBC 9.9 9.9  NEUTROABS -- --  HGB 11.4* 11.4*  HCT 35.2* 34.7*  MCV 91.4 90.8  PLT 174 142*   No results found for this basename: CKTOTAL:3,CKMB:3,CKMBINDEX:3,TROPONINI:3 in the last 72 hours No components found with this basename: POCBNP:3 No results found for this basename: DDIMER:2 in the last 72 hours No results found for this basename: HGBA1C:2 in the last 72 hours No results found for this basename: CHOL:2,HDL:2,LDLCALC:2,TRIG:2,CHOLHDL:2,LDLDIRECT:2 in the last 72 hours No results found for this basename: TSH,T4TOTAL,FREET3,T3FREE,THYROIDAB in the last 72 hours No results found for this basename: VITAMINB12:2,FOLATE:2,FERRITIN:2,TIBC:2,IRON:2,RETICCTPCT:2 in  the last 72 hours  Micro Results: Recent Results (from the past 240 hour(s))  CULTURE, BLOOD (ROUTINE X 2)     Status: Normal (Preliminary result)   Collection Time   06/21/11  8:08 PM      Component Value Range Status Comment   Specimen Description BLOOD RIGHT HAND   Final    Special Requests BOTTLES DRAWN AEROBIC ONLY 5CC   Final    Culture  Setup Time [id number]   Final    Culture     Final    Value:        BLOOD CULTURE RECEIVED NO GROWTH TO DATE CULTURE WILL BE HELD FOR 5 DAYS BEFORE ISSUING A FINAL NEGATIVE REPORT   Report Status PENDING   Incomplete   CULTURE, BLOOD (ROUTINE X 2)     Status: Normal (Preliminary result)   Collection Time   06/21/11  8:14 PM      Component Value Range Status Comment   Specimen Description BLOOD LEFT ARM   Final    Special Requests BOTTLES DRAWN AEROBIC ONLY 10CC   Final    Culture  Setup Time [id number]   Final    Culture     Final    Value:        BLOOD CULTURE RECEIVED NO GROWTH TO DATE CULTURE WILL BE HELD FOR  5 DAYS BEFORE ISSUING A FINAL NEGATIVE REPORT   Report Status PENDING   Incomplete   URINE CULTURE     Status: Normal   Collection Time   06/21/11  8:35 PM      Component Value Range Status Comment   Specimen Description URINE, CATHETERIZED   Final    Special Requests NONE   Final    Culture  Setup Time [id number]   Final    Colony Count NO GROWTH   Final    Culture NO GROWTH   Final    Report Status 06/22/2011 FINAL   Final   MRSA PCR SCREENING     Status: Normal   Collection Time   06/22/11  4:30 AM      Component Value Range Status Comment   MRSA by PCR NEGATIVE  NEGATIVE  Final     Studies/Results: No results found.  Medications: I have reviewed the patient's current medications. Scheduled Meds:   . albuterol  2.5 mg Nebulization Q6H  . ALPRAZolam  0.25 mg Oral BID  . amLODipine  10 mg Oral Daily  . aspirin EC  81 mg Oral Daily  . beta carotene w/minerals  1 tablet Oral Daily  . DULoxetine  30 mg Oral QHS  .  enoxaparin  30 mg Subcutaneous Q24H  . feeding supplement  237 mL Oral BID BM  . furosemide  20 mg Oral Daily  . insulin aspart  0-5 Units Subcutaneous QHS  . insulin aspart  0-9 Units Subcutaneous TID WC  . insulin glargine  78 Units Subcutaneous QHS  . ipratropium  0.5 mg Nebulization Q6H  . levothyroxine  100 mcg Oral QAC breakfast  . losartan  100 mg Oral Daily  . metoprolol  50 mg Oral BID  .  morphine injection  1 mg Intravenous Once  . pantoprazole  40 mg Oral Q1200  . piperacillin-tazobactam (ZOSYN)  IV  3.375 g Intravenous Q8H  . polyethylene glycol  17 g Oral Daily  . predniSONE  40 mg Oral Q breakfast  . vancomycin  750 mg Intravenous Q12H  . DISCONTD: insulin glargine  75 Units Subcutaneous QHS  . DISCONTD: methylPREDNISolone (SOLU-MEDROL) injection  60 mg Intravenous Q12H   Continuous Infusions:  PRN Meds:.acetaminophen, acetaminophen, albuterol, albuterol, alum & mag hydroxide-simeth, HYDROmorphone, ondansetron (ZOFRAN) IV, ondansetron, oxyCODONE  Assessment/Plan: Fever/ SOB (shortness of breath)/HCAP/wheezing, possible acute exacerbation of COPD/Pleural effusion  - clinically better mentation wise. Wbc now normal, afebrile. Continue current abx for total of 4 days IV abx and change to PO abx tomm  *Abdominal pain, other specified site- better, may be related to constipation.   *Metabolic encephalopathy- due to infection. Resolved.   *DM2- uncontrolled. Increase lantus. Continue hold metformin given recent contrast/arf. Expect stabilization with steroid taper.   *Htn- uncontrolled- resume losartan/lasix/lopressor. Follow renal fxn.   *dvt/gi prophylaxis   *Advance directives-talked to patient's family, including her grand daughter who is power of attorney. Patient has a living will and she is DNR/DNI, order in chart.  Plan to D/C back to SNF in AM   LOS: 4 days  Barret Esquivel, DO 06/25/2011, 10:14 AM

## 2011-06-26 ENCOUNTER — Inpatient Hospital Stay (HOSPITAL_COMMUNITY): Payer: Medicare Other

## 2011-06-26 LAB — GLUCOSE, CAPILLARY
Glucose-Capillary: 145 mg/dL — ABNORMAL HIGH (ref 70–99)
Glucose-Capillary: 238 mg/dL — ABNORMAL HIGH (ref 70–99)
Glucose-Capillary: 203 mg/dL — ABNORMAL HIGH (ref 70–99)

## 2011-06-26 MED ORDER — ALBUTEROL SULFATE (5 MG/ML) 0.5% IN NEBU: 2.5000 mg | INHALATION_SOLUTION | RESPIRATORY_TRACT | Status: DC | PRN

## 2011-06-26 MED ORDER — INSULIN ASPART 100 UNIT/ML ~~LOC~~ SOLN
4.0000 [IU] | Freq: Three times a day (TID) | SUBCUTANEOUS | Status: DC
  Administered 2011-06-26 – 2011-06-27 (×3): 4 [IU] via SUBCUTANEOUS

## 2011-06-26 MED ORDER — PREDNISONE 20 MG PO TABS: 40.0000 mg | ORAL_TABLET | Freq: Every day | ORAL | Status: AC

## 2011-06-26 MED ORDER — HYDRALAZINE HCL 10 MG PO TABS: 10.0000 mg | ORAL_TABLET | Freq: Four times a day (QID) | ORAL | Status: DC

## 2011-06-26 MED ORDER — IPRATROPIUM BROMIDE 0.02 % IN SOLN: 0.5000 mg | RESPIRATORY_TRACT | Status: DC | PRN

## 2011-06-26 MED ORDER — LACTULOSE 10 GM/15ML PO SOLN
10.0000 g | Freq: Once | ORAL | Status: AC
  Administered 2011-06-26: 10 g via ORAL
  Filled 2011-06-26: qty 15

## 2011-06-26 MED ORDER — LEVOTHYROXINE SODIUM 100 MCG PO TABS: 100.0000 ug | ORAL_TABLET | Freq: Every day | ORAL | Status: DC

## 2011-06-26 MED ORDER — FUROSEMIDE 10 MG/ML IJ SOLN
40.0000 mg | Freq: Once | INTRAMUSCULAR | Status: AC
  Administered 2011-06-26: 40 mg via INTRAVENOUS
  Filled 2011-06-26: qty 4

## 2011-06-26 MED ORDER — GLUCERNA SHAKE PO LIQD: 237.0000 mL | Freq: Two times a day (BID) | ORAL | Status: DC

## 2011-06-26 MED ORDER — HYDRALAZINE HCL 20 MG/ML IJ SOLN
10.0000 mg | INTRAMUSCULAR | Status: DC | PRN
  Administered 2011-06-26 – 2011-06-27 (×2): 10 mg via INTRAVENOUS
  Filled 2011-06-26: qty 0.5

## 2011-06-26 MED ORDER — INSULIN GLARGINE 100 UNIT/ML ~~LOC~~ SOLN: 78.0000 [IU] | Freq: Every day | SUBCUTANEOUS | Status: DC

## 2011-06-26 MED ORDER — LEVOFLOXACIN 500 MG PO TABS: 500.0000 mg | ORAL_TABLET | Freq: Every day | ORAL | Status: AC

## 2011-06-26 MED ORDER — HYDRALAZINE HCL 10 MG PO TABS
10.0000 mg | ORAL_TABLET | Freq: Four times a day (QID) | ORAL | Status: DC
  Administered 2011-06-26 – 2011-06-27 (×3): 10 mg via ORAL
  Filled 2011-06-26 (×8): qty 1

## 2011-06-26 NOTE — Discharge Summary (Addendum)
Discharge Summary  MACKENSEY BOLTE MR#: [id number]  DOB:27-Jan-1919  Date of Admission: 06/21/2011 Date of Discharge: 06/28/2011  Patient's PCP: Colon Branch, MD, MD  Attending Physician:Hue Steveson   Discharge Diagnoses: Principal Problem:  *Fever Active Problems:  SOB (shortness of breath)  Abdominal  pain, other specified site  Metabolic encephalopathy  Pleural effusion   Brief Admitting History and Physical [name] is an 78 y.o. female from SNF in [place] with decline over 2 days with fevers and SOB and increased lethargy. Patient reported as having decreased PO intake over the past 24 hours as well. She was evaluated in the ED and a Chest X-Ray was performed and found to have a left sided pleural effusion, and her labs revealed a leukocytosis with a WBC count of 27K. She was started empirically on IV Vancomycin and Zosyn to cover a possible Healthcare Acquired Pneumonia. Her family was concerned about observed ABD distension, so in the ED ABD X-Rays and a CT scan was performed which were negative for acute findings.    Discharge Medications Medication List  As of 06/28/2011 12:40 PM   STOP taking these medications         insulin lispro 100 UNIT/ML injection      levothyroxine 125 MCG tablet      magnesium hydroxide 400 MG/5ML suspension         TAKE these medications         albuterol (5 MG/ML) 0.5% nebulizer solution   Commonly known as: PROVENTIL   Take 0.5 mLs (2.5 mg total) by nebulization every 2 (two) hours as needed for wheezing or shortness of breath.      ALPRAZolam 0.25 MG tablet   Commonly known as: XANAX   Take 0.25 mg by mouth 2 (two) times daily.      amLODipine 10 MG tablet   Commonly known as: NORVASC   Take 10 mg by mouth daily.      aspirin 81 MG tablet   Take 81 mg by mouth daily.      beta carotene w/minerals tablet   Take 1 tablet by mouth daily.      DULoxetine 30 MG capsule   Commonly known as: CYMBALTA   Take 30 mg by  mouth at bedtime.      feeding supplement Liqd   Take 237 mLs by mouth 2 (two) times daily between meals.      furosemide 20 MG tablet   Commonly known as: LASIX   Take 20 mg by mouth daily.      hydrALAZINE 25 MG tablet   Commonly known as: APRESOLINE   Take 1 tablet (25 mg total) by mouth every 6 (six) hours.      insulin aspart 100 UNIT/ML injection   Commonly known as: novoLOG   Inject 0-5 Units into the skin at bedtime.      insulin aspart 100 UNIT/ML injection   Commonly known as: novoLOG   Inject 0-9 Units into the skin 3 (three) times daily with meals.      insulin glargine 100 UNIT/ML injection   Commonly known as: LANTUS   Inject 65 Units into the skin at bedtime.      ipratropium 0.02 % nebulizer solution   Commonly known as: ATROVENT   Take 2.5 mLs (0.5 mg total) by nebulization every 2 (two) hours as needed.      levofloxacin 500 MG tablet   Commonly known as: LEVAQUIN   Take 1 tablet (500 mg total) by  mouth daily. For 5 more days      levothyroxine 100 MCG tablet   Commonly known as: SYNTHROID, LEVOTHROID   Take 1 tablet (100 mcg total) by mouth daily before breakfast.      LOPRESSOR 50 MG tablet   Generic drug: metoprolol   Take 50 mg by mouth 2 (two) times daily.      losartan 100 MG tablet   Commonly known as: COZAAR   Take 100 mg by mouth daily.      metFORMIN 500 MG tablet   Commonly known as: GLUCOPHAGE   Take 500 mg by mouth 2 (two) times daily with a meal.      omeprazole 20 MG capsule   Commonly known as: PRILOSEC   Take 20 mg by mouth 2 (two) times daily.      polyethylene glycol packet   Commonly known as: MIRALAX / GLYCOLAX   Take 17 g by mouth daily.      potassium chloride 20 MEQ packet   Commonly known as: KLOR-CON   Take 20 mEq by mouth daily.      predniSONE 20 MG tablet   Commonly known as: DELTASONE   Take 2 tablets (40 mg total) by mouth daily with breakfast. For 4 more days      simvastatin 20 MG tablet   Commonly  known as: ZOCOR   Take 20 mg by mouth daily.            Hospital Course: Fever/ SOB (shortness of breath)/HCAP/wheezing, possible acute exacerbation of COPD/Pleural effusion  - clinically better mentation wise. Wbc now normal, afebrile. IV abx for total of 4 days now PO abx for total of 10 days *Abdominal pain, other specified site- better, may be related to constipation- given lactulose and soap suds enema *Metabolic encephalopathy- due to infection. Resolved.  *DM2- uncontrolled. Increase lantus. Continue hold metformin given recent contrast/arf. Expect stabilization with steroid taper.  *Htn- uncontrolled- resume losartan/lasix/lopressor- add hydralazine       Day of Discharge BP 166/72  Pulse 72  Temp(Src) 98.8 F (37.1 C) (Oral)  Resp 18  Ht 5\' 9"  (1.753 m)  Wt 85.73 kg (189 lb)  BMI 27.91 kg/m2  SpO2 96% Appears to be at baseline Occasional wheeze +BS, soft, NT -c/c/e Spoke at length with daughter who agrees patient is back to her baseline mental but weaker  Results for orders placed during the hospital encounter of 06/21/11 (from the past 48 hour(s))  GLUCOSE, CAPILLARY     Status: Abnormal   Collection Time   06/26/11  4:41 PM      Component Value Range Comment   Glucose-Capillary 238 (*) 70 - 99 (mg/dL)    Comment 1 Documented in Chart      Comment 2 Notify RN     GLUCOSE, CAPILLARY     Status: Abnormal   Collection Time   06/26/11  9:31 PM      Component Value Range Comment   Glucose-Capillary 203 (*) 70 - 99 (mg/dL)    Comment 1 Notify RN      Comment 2 Documented in Chart     CBC     Status: Abnormal   Collection Time   06/27/11  6:25 AM      Component Value Range Comment   WBC 13.3 (*) 4.0 - 10.5 (K/uL)    RBC 4.53  3.87 - 5.11 (MIL/uL)    Hemoglobin 13.2  12.0 - 15.0 (g/dL)    HCT 45.4  09.8 -  46.0 (%)    MCV 88.5  78.0 - 100.0 (fL)    MCH 29.1  26.0 - 34.0 (pg)    MCHC 32.9  30.0 - 36.0 (g/dL)    RDW 47.8  29.5 - 62.1 (%)    Platelets 223  150 -  400 (K/uL)   BASIC METABOLIC PANEL     Status: Abnormal   Collection Time   06/27/11  6:25 AM      Component Value Range Comment   Sodium 137  135 - 145 (mEq/L)    Potassium 3.3 (*) 3.5 - 5.1 (mEq/L)    Chloride 98  96 - 112 (mEq/L)    CO2 29  19 - 32 (mEq/L)    Glucose, Bld 54 (*) 70 - 99 (mg/dL)    BUN 21  6 - 23 (mg/dL)    Creatinine, Ser 3.08  0.50 - 1.10 (mg/dL)    Calcium 8.2 (*) 8.4 - 10.5 (mg/dL)    GFR calc non Af Amer 54 (*) >90 (mL/min)    GFR calc Af Amer 62 (*) >90 (mL/min)   GLUCOSE, CAPILLARY     Status: Normal   Collection Time   06/27/11  7:35 AM      Component Value Range Comment   Glucose-Capillary 70  70 - 99 (mg/dL)   GLUCOSE, CAPILLARY     Status: Abnormal   Collection Time   06/27/11 11:20 AM      Component Value Range Comment   Glucose-Capillary 109 (*) 70 - 99 (mg/dL)   GLUCOSE, CAPILLARY     Status: Abnormal   Collection Time   06/27/11  4:39 PM      Component Value Range Comment   Glucose-Capillary 228 (*) 70 - 99 (mg/dL)   GLUCOSE, CAPILLARY     Status: Abnormal   Collection Time   06/27/11  9:21 PM      Component Value Range Comment   Glucose-Capillary 287 (*) 70 - 99 (mg/dL)    Comment 1 Hypo Protocol      Comment 2 Documented in Chart      Comment 3 Notify RN     GLUCOSE, CAPILLARY     Status: Abnormal   Collection Time   06/28/11  7:13 AM      Component Value Range Comment   Glucose-Capillary 162 (*) 70 - 99 (mg/dL)    Comment 1 Documented in Chart      Comment 2 Notify RN     GLUCOSE, CAPILLARY     Status: Normal   Collection Time   06/28/11 11:08 AM      Component Value Range Comment   Glucose-Capillary 76  70 - 99 (mg/dL)     Dg Chest 1 View  6/57/8469  *RADIOLOGY REPORT*  Clinical Data: Wheezing, bilateral rales, hypoxia, and altered mental status.  CHEST - 1 VIEW  Comparison: 02/02/2011  Findings: Shallow inspiration.  Cardiac enlargement with mild prominence of pulmonary vascularity.  This is slightly more prominent than on the previous  study may represent early congestive change.  Peribronchial thickening and streaky opacities suggesting chronic bronchitic changes.  No focal airspace consolidation in the lungs.  No blunting of costophrenic angles.  No pneumothorax. Degenerative changes in the thoracic spine.  The old right rib fracture.  Calcification of the aorta.  IMPRESSION: Shallow inspiration.  Mild cardiac enlargement with prominent pulmonary vascularity suggesting early congestive change.  No focal consolidation.  Original Report Authenticated By: Marlon Pel, M.D.   Ct Abdomen Pelvis  W Contrast  06/22/2011  *RADIOLOGY REPORT*  Clinical Data: Abdominal distension.  CT ABDOMEN AND PELVIS WITH CONTRAST  Technique:  Multidetector CT imaging of the abdomen and pelvis was performed following the standard protocol during bolus administration of intravenous contrast.  Contrast: OMNIPAQUE IOHEXOL 300 MG/ML IJ SOLN  Comparison: 04/12/2007  Findings: Respiratory motion artifact in the lung bases.  Small left pleural effusion with basilar atelectasis.  Aortic and coronary artery calcifications.  The liver, spleen, pancreas, adrenal glands, kidneys, and retroperitoneal lymph nodes are unremarkable.  Cholelithiasis. Fairly extensive calcification of the abdominal aorta and visceral branch vessels.  No aneurysm.  Vascular stenosis is not excluded. Contrast flow is demonstrated within the visualized vessels.  The stomach, small bowel, and stool filled colon are decompressed without distension or abnormal wall thickening.  There is diffuse prominence of the visceral adipose tissue with thinning and laxity of the anterior abdominal wall although the hernia is demonstrated. No free air or free fluid in the abdomen.  Pelvis:  Foley catheter decompresses the bladder.  Calcification in the uterus consistent with fibroids.  No abnormal adnexal masses. No free or loculated pelvic fluid collections.  No significant pelvic lymphadenopathy.  Diffuse  degenerative change in the spine. No destructive bone lesions visualized.  Study is technically limited due to motion artifact.  IMPRESSION: The small left pleural effusion with basilar atelectasis. Cholelithiasis.  Calcified uterine fibroid.  Extensive vascular calcifications.  Findings are similar to the previous study.  No evidence of bowel distension or ascites.  Prominent visceral adipose tissue with laxity of the abdominal wall musculature.  Original Report Authenticated By: Marlon Pel, M.D.   Dg Abd 2 Views  06/21/2011  *RADIOLOGY REPORT*  Clinical Data: Abdominal distention, shortness of breath.  ABDOMEN - 2 VIEW  Comparison: 04/15/2007  Findings: There is a nonobstructive bowel gas pattern.  No free air.  Large calcified fibroid noted in the pelvis.  No organomegaly.  No acute bony abnormality.  Degenerative changes in the lumbar spine and hips.  IMPRESSION: No obstruction or free air.  No acute findings.  Original Report Authenticated By: Cyndie Chime, M.D.     Disposition: snf  Diet: cardiac/diabetic  Activity: as tolerated   Follow-up Appts: PCP in 1-2 weeks or as needed  Discharge Orders    Future Orders Please Complete By Expires   Diet - low sodium heart healthy      Diet Carb Modified      Diet - low sodium heart healthy      Diet Carb Modified      Increase activity slowly      Increase activity slowly      Discharge instructions      Comments:   Wean off O2 as tolerated       Time spent on discharge, talking to the patient, and coordinating care: 45 mins.   SignedMarlin Canary, DO 06/28/2011, 12:40 PM

## 2011-06-26 NOTE — Progress Notes (Signed)
Subjective: Patient had an episode of choking earlier with dec in O2 sats Per daughter patient grunts at baseline   Objective: Vital signs in last 24 hours: Filed Vitals:   06/26/11 0001 06/26/11 0100 06/26/11 0531 06/26/11 0900  BP: 160/72 166/66 182/70 178/69  Pulse: 78 97 96 80  Temp:  97.9 F (36.6 C) 97.9 F (36.6 C) 99 F (37.2 C)  TempSrc:  Oral Oral Oral  Resp:  20 20 20   Height:      Weight:      SpO2:  94% 98% 95%   Weight change:   Intake/Output Summary (Last 24 hours) at 06/26/11 1137 Last data filed at 06/26/11 0800  Gross per 24 hour  Intake    240 ml  Output   1225 ml  Net   -985 ml    Physical Exam: General: Awake, No acute distress. HEENT: EOMI. Neck: Supple CV: S1 and S2, rrr Lungs: Clear to ascultation bilaterally, no wheezing Abdomen: Soft, Nontender, Nondistended, +bowel sounds. Ext: Good pulses. Trace edema.   Lab Results:  [facility] 06/25/11 0616 06/24/11 0650  NA 136 134*  K 3.7 3.8  CL 96 94*  CO2 33* 30  GLUCOSE 220* 283*  BUN 29* 25*  CREATININE 0.98 1.06  CALCIUM 8.1* 8.1*  MG -- --  PHOS -- --    Basename 06/25/11 0616 06/24/11 0650  AST 13 14  ALT 22 22  ALKPHOS 68 77  BILITOT 0.5 0.7  PROT 5.7* 5.9*  ALBUMIN 2.6* 2.7*   No results found for this basename: LIPASE:2,AMYLASE:2 in the last 72 hours  Basename 06/25/11 0616 06/24/11 0650  WBC 9.9 9.9  NEUTROABS -- --  HGB 11.4* 11.4*  HCT 35.2* 34.7*  MCV 91.4 90.8  PLT 174 142*   No results found for this basename: CKTOTAL:3,CKMB:3,CKMBINDEX:3,TROPONINI:3 in the last 72 hours No components found with this basename: POCBNP:3 No results found for this basename: DDIMER:2 in the last 72 hours No results found for this basename: HGBA1C:2 in the last 72 hours No results found for this basename: CHOL:2,HDL:2,LDLCALC:2,TRIG:2,CHOLHDL:2,LDLDIRECT:2 in the last 72 hours No results found for this basename: TSH,T4TOTAL,FREET3,T3FREE,THYROIDAB in the last 72 hours No results  found for this basename: VITAMINB12:2,FOLATE:2,FERRITIN:2,TIBC:2,IRON:2,RETICCTPCT:2 in the last 72 hours  Micro Results: Recent Results (from the past 240 hour(s))  CULTURE, BLOOD (ROUTINE X 2)     Status: Normal (Preliminary result)   Collection Time   06/21/11  8:08 PM      Component Value Range Status Comment   Specimen Description BLOOD RIGHT HAND   Final    Special Requests BOTTLES DRAWN AEROBIC ONLY 5CC   Final    Culture  Setup Time [id number]   Final    Culture     Final    Value:        BLOOD CULTURE RECEIVED NO GROWTH TO DATE CULTURE WILL BE HELD FOR 5 DAYS BEFORE ISSUING A FINAL NEGATIVE REPORT   Report Status PENDING   Incomplete   CULTURE, BLOOD (ROUTINE X 2)     Status: Normal (Preliminary result)   Collection Time   06/21/11  8:14 PM      Component Value Range Status Comment   Specimen Description BLOOD LEFT ARM   Final    Special Requests BOTTLES DRAWN AEROBIC ONLY 10CC   Final    Culture  Setup Time [id number]   Final    Culture     Final    Value:  BLOOD CULTURE RECEIVED NO GROWTH TO DATE CULTURE WILL BE HELD FOR 5 DAYS BEFORE ISSUING A FINAL NEGATIVE REPORT   Report Status PENDING   Incomplete   URINE CULTURE     Status: Normal   Collection Time   06/21/11  8:35 PM      Component Value Range Status Comment   Specimen Description URINE, CATHETERIZED   Final    Special Requests NONE   Final    Culture  Setup Time [id number]   Final    Colony Count NO GROWTH   Final    Culture NO GROWTH   Final    Report Status 06/22/2011 FINAL   Final   MRSA PCR SCREENING     Status: Normal   Collection Time   06/22/11  4:30 AM      Component Value Range Status Comment   MRSA by PCR NEGATIVE  NEGATIVE  Final     Studies/Results: No results found.  Medications: I have reviewed the patient's current medications. Scheduled Meds:    . ALPRAZolam  0.25 mg Oral BID  . amLODipine  10 mg Oral Daily  . aspirin EC  81 mg Oral Daily  . beta carotene w/minerals  1  tablet Oral Daily  . bisacodyl  5 mg Oral Once  . DULoxetine  30 mg Oral QHS  . enoxaparin  30 mg Subcutaneous Q24H  . feeding supplement  237 mL Oral BID BM  . furosemide  20 mg Oral Daily  . hydrALAZINE  10 mg Oral Q6H  . insulin aspart  0-5 Units Subcutaneous QHS  . insulin aspart  0-9 Units Subcutaneous TID WC  . insulin glargine  78 Units Subcutaneous QHS  . lactulose  10 g Oral Once  . levofloxacin  500 mg Oral Daily  . levothyroxine  100 mcg Oral QAC breakfast  . losartan  100 mg Oral Daily  . metoprolol  50 mg Oral BID  .  morphine injection  1 mg Intravenous Once  . pantoprazole  40 mg Oral Q1200  . piperacillin-tazobactam (ZOSYN)  IV  3.375 g Intravenous Q8H  . polyethylene glycol  17 g Oral Daily  . predniSONE  40 mg Oral Q breakfast  . vancomycin  750 mg Intravenous Q12H  . DISCONTD: albuterol  2.5 mg Nebulization Q6H  . DISCONTD: ipratropium  0.5 mg Nebulization Q6H   Continuous Infusions:  PRN Meds:.acetaminophen, acetaminophen, albuterol, albuterol, alum & mag hydroxide-simeth, hydrALAZINE, HYDROmorphone, ipratropium, ondansetron (ZOFRAN) IV, ondansetron, oxyCODONE  Assessment/Plan: Fever/ SOB (shortness of breath)/HCAP/wheezing, possible acute exacerbation of COPD/Pleural effusion  - clinically better mentation wise. Wbc now normal, afebrile. Continue current abx for total of 4 days IV abx and change to PO abx tomm  *Abdominal pain, other specified site- better, may be related to constipation- enema ordered   *Metabolic encephalopathy- due to infection. Resolved.   *DM2- uncontrolled. Increase lantus. Continue hold metformin given recent contrast/arf. Expect stabilization with steroid taper.   *Htn- uncontrolled- resume losartan/lasix/lopressor. Follow renal fxn.   *dvt/gi prophylaxis   *Advance directives-talked to patient's family, including her grand daughter who is power of attorney. Patient has a living will and she is DNR/DNI, order in chart.  Plan to  D/C back to SNF in AM   LOS: 5 days  Rhina Kramme, DO 06/26/2011, 11:37 AM

## 2011-06-26 NOTE — Progress Notes (Signed)
Inpatient Diabetes Program Recommendations  AACE/ADA: New Consensus Statement on Inpatient Glycemic Control (2009)  Target Ranges:  Prepandial:   less than 140 mg/dL      Peak postprandial:   less than 180 mg/dL (1-2 hours)      Critically ill patients:  140 - 180 mg/dL   Reason for Visit: Fasting and post-prandial hyperglycemia sustained in 200-300 range.  Inpatient Diabetes Program Recommendations Insulin - Meal Coverage: Pt needs Novlog meal coverage, especially while on Prednisone.  Pt takes 5 units tidwc at home. Please add 4 units tidwc.  Note: (May need an increase in basal Lantus as well if fasting glucose levels remain high. Thank you, Lenor Coffin, RN, CNS, Diabetes Coordinator [phone])

## 2011-06-26 NOTE — Progress Notes (Signed)
Per MD, patient is not ready to dc today but possibly tomorrow. CSW called SNF and updated facility on patient's dc plan. SNF stated that patient did a bed hold so can return when ready. CSW will continue to follow. [place], [place] [phone]

## 2011-06-27 ENCOUNTER — Inpatient Hospital Stay (HOSPITAL_COMMUNITY): Payer: Medicare Other

## 2011-06-27 LAB — BASIC METABOLIC PANEL
BUN: 21 mg/dL (ref 6–23)
CO2: 29 mEq/L (ref 19–32)
Chloride: 98 mEq/L (ref 96–112)
Creatinine, Ser: 0.91 mg/dL (ref 0.50–1.10)
Potassium: 3.3 mEq/L — ABNORMAL LOW (ref 3.5–5.1)

## 2011-06-27 LAB — GLUCOSE, CAPILLARY
Glucose-Capillary: 70 mg/dL (ref 70–99)
Glucose-Capillary: 287 mg/dL — ABNORMAL HIGH (ref 70–99)

## 2011-06-27 LAB — CBC
HCT: 40.1 % (ref 36.0–46.0)
MCV: 88.5 fL (ref 78.0–100.0)
RBC: 4.53 MIL/uL (ref 3.87–5.11)
WBC: 13.3 10*3/uL — ABNORMAL HIGH (ref 4.0–10.5)

## 2011-06-27 MED ORDER — HYDRALAZINE HCL 25 MG PO TABS
25.0000 mg | ORAL_TABLET | Freq: Four times a day (QID) | ORAL | Status: DC
  Administered 2011-06-27 – 2011-06-28 (×5): 25 mg via ORAL
  Filled 2011-06-27 (×8): qty 1

## 2011-06-27 MED ORDER — INSULIN GLARGINE 100 UNIT/ML ~~LOC~~ SOLN
65.0000 [IU] | Freq: Every day | SUBCUTANEOUS | Status: DC
  Administered 2011-06-27: 65 [IU] via SUBCUTANEOUS

## 2011-06-27 MED ORDER — INSULIN GLARGINE 100 UNIT/ML ~~LOC~~ SOLN: 75.0000 [IU] | Freq: Every day | SUBCUTANEOUS | Status: DC

## 2011-06-27 MED ORDER — POLYETHYLENE GLYCOL 3350 17 GM/SCOOP PO POWD
0.5000 | Freq: Once | ORAL | Status: AC
  Administered 2011-06-27: 0.5 via ORAL
  Filled 2011-06-27: qty 255

## 2011-06-27 NOTE — Progress Notes (Signed)
Pt refused her po medications tonight,verbalised did not want anything,pt confused and almost combative,Dr Thompson(on call) called and notified,said to continue to observed,pt reassured. [place], Kenasia Scheller Efe

## 2011-06-27 NOTE — Progress Notes (Signed)
At the said time patient had a bowel movement, though her abdomen looks distended. Will continue to give the miralax as ordered.

## 2011-06-27 NOTE — Progress Notes (Signed)
Inpatient Diabetes Program Recommendations  AACE/ADA: New Consensus Statement on Inpatient Glycemic Control (2009)  Target Ranges:  Prepandial:   less than 140 mg/dL      Peak postprandial:   less than 180 mg/dL (1-2 hours)      Critically ill patients:  140 - 180 mg/dL   Reason for Visit: Results for Roberta, [name] (MRN [id number]) as of 06/27/2011 08:35  Ref. Range 06/27/2011 06:25  Glucose Latest Range: 70-99 mg/dL 54 (L)    Note lab glucose is 54 this morning.  Will need to decrease basal insulin.  Inpatient Diabetes Program Recommendations Insulin - Basal: Please decrease Lantus to 65 units daily. Insulin - Meal Coverage: .  Note: Will follow.

## 2011-06-27 NOTE — Progress Notes (Signed)
Per MD, patient has to have a BM before dc. CSW called SNF who stated patient can admit today or tomorrow whenever patient is dc. CSW text paged MD this information. CSW will continue to follow. [place], [place] [phone]

## 2011-06-27 NOTE — Progress Notes (Addendum)
Subjective: Awake- no new c/o breathing fine, no abd pain,   Objective: Vital signs in last 24 hours: Filed Vitals:   06/26/11 2100 06/27/11 0144 06/27/11 0254 06/27/11 0700  BP: 165/67 189/55 149/54 159/85  Pulse: 78 87 94 95  Temp: 99.2 F (37.3 C) 98.6 F (37 C)  97 F (36.1 C)  TempSrc: Oral Oral  Oral  Resp: 20 18  18   Height:      Weight:      SpO2: 95% 97%  98%   Weight change:   Intake/Output Summary (Last 24 hours) at 06/27/11 0821 Last data filed at 06/26/11 1411  Gross per 24 hour  Intake    240 ml  Output      1 ml  Net    239 ml    Physical Exam: General: Awake, No acute distress. HEENT: EOMI. Neck: Supple CV: S1 and S2, rrr Lungs: Clear to ascultation bilaterally, no wheezing Abdomen: Soft, Nontender, Nondistended, +bowel sounds. Ext: Good pulses. Trace edema.   Lab Results:  Basename 06/27/11 0625 06/25/11 0616  NA 137 136  K 3.3* 3.7  CL 98 96  CO2 29 33*  GLUCOSE 54* 220*  BUN 21 29*  CREATININE 0.91 0.98  CALCIUM 8.2* 8.1*  MG -- --  PHOS -- --    Basename 06/25/11 0616  AST 13  ALT 22  ALKPHOS 68  BILITOT 0.5  PROT 5.7*  ALBUMIN 2.6*   No results found for this basename: LIPASE:2,AMYLASE:2 in the last 72 hours  Basename 06/27/11 0625 06/25/11 0616  WBC 13.3* 9.9  NEUTROABS -- --  HGB 13.2 11.4*  HCT 40.1 35.2*  MCV 88.5 91.4  PLT 223 174   No results found for this basename: CKTOTAL:3,CKMB:3,CKMBINDEX:3,TROPONINI:3 in the last 72 hours No components found with this basename: POCBNP:3 No results found for this basename: DDIMER:2 in the last 72 hours No results found for this basename: HGBA1C:2 in the last 72 hours No results found for this basename: CHOL:2,HDL:2,LDLCALC:2,TRIG:2,CHOLHDL:2,LDLDIRECT:2 in the last 72 hours No results found for this basename: TSH,T4TOTAL,FREET3,T3FREE,THYROIDAB in the last 72 hours No results found for this basename: VITAMINB12:2,FOLATE:2,FERRITIN:2,TIBC:2,IRON:2,RETICCTPCT:2 in the last 72  hours  Micro Results: Recent Results (from the past 240 hour(s))  CULTURE, BLOOD (ROUTINE X 2)     Status: Normal (Preliminary result)   Collection Time   06/21/11  8:08 PM      Component Value Range Status Comment   Specimen Description BLOOD RIGHT HAND   Final    Special Requests BOTTLES DRAWN AEROBIC ONLY 5CC   Final    Culture  Setup Time [id number]   Final    Culture     Final    Value:        BLOOD CULTURE RECEIVED NO GROWTH TO DATE CULTURE WILL BE HELD FOR 5 DAYS BEFORE ISSUING A FINAL NEGATIVE REPORT   Report Status PENDING   Incomplete   CULTURE, BLOOD (ROUTINE X 2)     Status: Normal (Preliminary result)   Collection Time   06/21/11  8:14 PM      Component Value Range Status Comment   Specimen Description BLOOD LEFT ARM   Final    Special Requests BOTTLES DRAWN AEROBIC ONLY 10CC   Final    Culture  Setup Time [id number]   Final    Culture     Final    Value:        BLOOD CULTURE RECEIVED NO GROWTH TO DATE CULTURE WILL BE HELD FOR  5 DAYS BEFORE ISSUING A FINAL NEGATIVE REPORT   Report Status PENDING   Incomplete   URINE CULTURE     Status: Normal   Collection Time   06/21/11  8:35 PM      Component Value Range Status Comment   Specimen Description URINE, CATHETERIZED   Final    Special Requests NONE   Final    Culture  Setup Time [id number]   Final    Colony Count NO GROWTH   Final    Culture NO GROWTH   Final    Report Status 06/22/2011 FINAL   Final   MRSA PCR SCREENING     Status: Normal   Collection Time   06/22/11  4:30 AM      Component Value Range Status Comment   MRSA by PCR NEGATIVE  NEGATIVE  Final     Studies/Results: Dg Chest 2 View  06/26/2011  *RADIOLOGY REPORT*  Clinical Data: Evaluate for pneumonia versus pleural effusion  CHEST - 2 VIEW  Comparison: 06/21/2011  Findings: Low lung volumes are present and taking this into consideration heart size is mildly enlarged.  Ectasia of the thoracic aorta with aortic calcification is noted and is stable.   Bilateral pleural effusions are seen left greater than right. Associated density at the left lung base may represent compressive atelectasis although basilar pneumonia is not excluded in the appropriate clinical setting.  Pulmonary vascular congestion with haziness of the central vasculature, fissural prominence and interstitial septal lines correlates with underlying pulmonary edema.  Bony structures demonstrate degenerative osteophytosis diffusely throughout the thoracic and upper lumbar spine.  IMPRESSION: Findings compatible with interstitial edema with bilateral pleural effusions.  Probable associated left basilar atelectasis although left lower lobe pneumonia is not excluded in the appropriate clinical setting.  Original Report Authenticated By: Bertha Stakes, M.D.    Medications: I have reviewed the patient's current medications. Scheduled Meds:    . ALPRAZolam  0.25 mg Oral BID  . amLODipine  10 mg Oral Daily  . aspirin EC  81 mg Oral Daily  . beta carotene w/minerals  1 tablet Oral Daily  . DULoxetine  30 mg Oral QHS  . enoxaparin  30 mg Subcutaneous Q24H  . feeding supplement  237 mL Oral BID BM  . furosemide  40 mg Intravenous Once  . furosemide  20 mg Oral Daily  . hydrALAZINE  10 mg Oral Q6H  . insulin aspart  0-5 Units Subcutaneous QHS  . insulin aspart  0-9 Units Subcutaneous TID WC  . insulin aspart  4 Units Subcutaneous TID WC  . insulin glargine  78 Units Subcutaneous QHS  . lactulose  10 g Oral Once  . levofloxacin  500 mg Oral Daily  . levothyroxine  100 mcg Oral QAC breakfast  . losartan  100 mg Oral Daily  . metoprolol  50 mg Oral BID  .  morphine injection  1 mg Intravenous Once  . pantoprazole  40 mg Oral Q1200  . polyethylene glycol  17 g Oral Daily  . predniSONE  40 mg Oral Q breakfast   Continuous Infusions:  PRN Meds:.acetaminophen, acetaminophen, albuterol, albuterol, alum & mag hydroxide-simeth, hydrALAZINE, ipratropium, ondansetron (ZOFRAN) IV,  ondansetron, oxyCODONE, DISCONTD: hydrALAZINE, DISCONTD: HYDROmorphone  Assessment/Plan: Fever/ SOB (shortness of breath)/HCAP/wheezing, possible acute exacerbation of COPD/Pleural effusion  - clinically better mentation wise. afebrile. Continue current abx for total of 4 days IV abx and change to PO abx tomm  *Abdominal pain, other specified site- better, may be  related to constipation- enema ordered   *Metabolic encephalopathy- due to infection. Resolved.   *DM2- uncontrolled. Increase lantus. Continue hold metformin given recent contrast/arf. Expect stabilization with steroid taper.   *Htn- uncontrolled- resume losartan/lasix/lopressor. Follow renal fxn.   contipation- miralax (has been given lactulose and other medications, patient has great bowel sounds and eats well but no BM) *dvt/gi prophylaxis   *Advance directives-talked to patient's family, including her grand daughter who is power of attorney. Patient has a living will and she is DNR/DNI, order in chart.  Plan to D/C back to SNF tomm AM- family c/o distention, abdominal x-ray shows bowel gas and stool throughout but .not obstructing.    Sundowning/dementia: patient has confusion at night   LOS: 6 days  Kati Riggenbach, DO 06/27/2011, 8:21 AM

## 2011-06-27 NOTE — Evaluation (Signed)
Physical Therapy Evaluation Patient Details Name: [name] MRN: [id number] DOB: 11-02-1918 Today's Date: 06/27/2011  Problem List:  Patient Active Problem List  Diagnoses  . Fever  . SOB (shortness of breath)  . Abdominal  pain, other specified site  . Metabolic encephalopathy  . Pleural effusion  . COPD (chronic obstructive pulmonary disease)  . Hypertension  . Dysphagia  . Hypothyroidism  . Hyperlipemia  . CAD (coronary artery disease)  . Late effects of CVA (cerebrovascular accident)    Past Medical History:  Past Medical History  Diagnosis Date  . Dysphagia   . Diabetes mellitus   . Hypertension   . Hypothyroidism   . Hyperlipemia   . GERD (gastroesophageal reflux disease)   . Depression   . Anxiety   . Acute MI   . Pneumonia   . Stroke   . UTI (urinary tract infection)   . COPD (chronic obstructive pulmonary disease)   . CAD (coronary artery disease)   . Late effects of CVA (cerebrovascular accident)    Past Surgical History:  Past Surgical History  Procedure Date  . Back surgery     PT Assessment/Plan/Recommendation PT Assessment Clinical Impression Statement: Pt presents with a medical diagnosis of increased fever along with generalized weakness and decreased endurance. Pt will benefit from skilled PT in the acute care setting in order to maximize functional mobility prior to d/c PT Recommendation/Assessment: Patient will need skilled PT in the acute care venue PT Problem List: Decreased strength;Decreased activity tolerance;Decreased balance;Decreased mobility;Decreased knowledge of use of DME;Decreased safety awareness;Pain PT Therapy Diagnosis : Generalized weakness;Acute pain PT Plan PT Frequency: Min 3X/week PT Treatment/Interventions: DME instruction;Gait training;Functional mobility training;Therapeutic activities;Balance training;Therapeutic exercise;Patient/family education PT Recommendation Follow Up Recommendations: Skilled nursing  facility (pt lives in SNF, needs rehab at [facility]) Equipment Recommended: None recommended by PT PT Goals  Acute Rehab PT Goals PT Goal Formulation: With patient/family Time For Goal Achievement: 7 days Pt will go Supine/Side to Sit: with min assist PT Goal: Supine/Side to Sit - Progress: Goal set today Pt will go Sit to Supine/Side: with min assist PT Goal: Sit to Supine/Side - Progress: Goal set today Pt will go Sit to Stand: with min assist PT Goal: Sit to Stand - Progress: Goal set today Pt will go Stand to Sit: with min assist PT Goal: Stand to Sit - Progress: Goal set today Pt will Transfer Bed to Chair/Chair to Bed: with min assist PT Transfer Goal: Bed to Chair/Chair to Bed - Progress: Goal set today Pt will Ambulate: with mod assist;16 - 50 feet;with least restrictive assistive device PT Goal: Ambulate - Progress: Goal set today Pt will Perform Home Exercise Program: with supervision, verbal cues required/provided PT Goal: Perform Home Exercise Program - Progress: Goal set today  PT Evaluation Precautions/Restrictions  Precautions Precautions: Fall Restrictions Weight Bearing Restrictions: No Prior Functioning  Home Living Lives With: Other (Comment) (in NHP) Receives Help From: Personal care attendant Type of Home: Skilled Nursing Facility Bathroom Shower/Tub: Other (comment) (shower room) Prior Function Level of Independence: Independent with gait;Independent with transfers;Needs assistance with ADLs Bath: Moderate Toileting:  (independent) Dressing:  (independent) Able to Take Stairs?: No Driving: No Vocation: Retired Leisure: Hobbies-no Cognition Cognition Arousal/Alertness: Awake/alert Overall Cognitive Status: History of cognitive impairments History of Cognitive Impairment: Appears at baseline functioning Orientation Level: Oriented to person;Disoriented to place;Disoriented to time;Disoriented to situation Sensation/Coordination Sensation Light Touch:  Appears Intact Coordination Gross Motor Movements are Fluid and Coordinated: Yes Extremity Assessment RLE Assessment  RLE Assessment: Within Functional Limits LLE Assessment LLE Assessment: Within Functional Limits Mobility (including Balance) Bed Mobility Bed Mobility: Yes Supine to Sit: 2: Max assist Supine to Sit Details (indicate cue type and reason): VC for sequencing. Pt able to help minimally secondary to back pain. Pts daughter assisted with cueing and encouragement to sit at edge of bed. Sitting - Scoot to Edge of Bed: 2: Max assist Sitting - Scoot to [employer] of Bed Details (indicate cue type and reason): VC for weight shifting. Assist through trunk and drawsheet to scoot forward Transfers Transfers: Yes Sit to Stand: 3: Mod assist;2: Max assist;With upper extremity assist;From bed Sit to Stand Details (indicate cue type and reason): Multiple standing attempts to adjust depends as well as for additional strengthening. Mod to Max assist at times. VC for hand placement and sequencing as well as tactile cues through pelvis and trunk for anterior translation Stand to Sit: 3: Mod assist;With upper extremity assist;To chair/3-in-1;To bed Stand to Sit Details: VC for hand placement and safety. Assist to control descent Stand Pivot Transfers: 2: Max assist Stand Pivot Transfer Details (indicate cue type and reason): VC for sequencing. Increased encouragement for LE movement initiation. Tactile cues through pelvis for guidance Ambulation/Gait Ambulation/Gait: No  Balance Balance Assessed: Yes Static Sitting Balance Static Sitting - Balance Support: Bilateral upper extremity supported;Feet supported Static Sitting - Level of Assistance: 3: Mod assist Static Sitting - Comment/# of Minutes: Pt able to sustain sitting position with mod assist for ~ 5 minutes prior to transfer Exercise    End of Session PT - End of Session Equipment Utilized During Treatment: Gait belt Activity Tolerance:  Patient limited by fatigue;Patient limited by pain Patient left: in chair;with call bell in reach;with family/visitor present Nurse Communication: Mobility status for transfers;Mobility status for ambulation;Other (comment) (back pain) General Behavior During Session: [facility] for tasks performed Cognition: Impaired, at baseline  Milana Kidney 06/27/2011, 1:21 PM  06/27/2011 Milana Kidney DPT PAGER: [phone] OFFICE: [phone]

## 2011-06-28 LAB — CULTURE, BLOOD (ROUTINE X 2)
Culture  Setup Time: 201303310212
Culture  Setup Time: 201303310212
Culture: NO GROWTH

## 2011-06-28 LAB — GLUCOSE, CAPILLARY
Glucose-Capillary: 162 mg/dL — ABNORMAL HIGH (ref 70–99)
Glucose-Capillary: 76 mg/dL (ref 70–99)
Glucose-Capillary: 102 mg/dL — ABNORMAL HIGH (ref 70–99)

## 2011-06-28 MED ORDER — INSULIN ASPART 100 UNIT/ML ~~LOC~~ SOLN: 0.0000 [IU] | Freq: Three times a day (TID) | SUBCUTANEOUS | Status: DC

## 2011-06-28 MED ORDER — INSULIN GLARGINE 100 UNIT/ML ~~LOC~~ SOLN: 65.0000 [IU] | Freq: Every day | SUBCUTANEOUS | Status: DC

## 2011-06-28 MED ORDER — INSULIN ASPART 100 UNIT/ML ~~LOC~~ SOLN: 4.0000 [IU] | Freq: Three times a day (TID) | SUBCUTANEOUS | Status: DC

## 2011-06-28 MED ORDER — HYDRALAZINE HCL 25 MG PO TABS: 25.0000 mg | ORAL_TABLET | Freq: Four times a day (QID) | ORAL | Status: DC

## 2011-06-28 MED ORDER — INSULIN ASPART 100 UNIT/ML ~~LOC~~ SOLN: 0.0000 [IU] | Freq: Every day | SUBCUTANEOUS | Status: DC

## 2011-06-28 NOTE — Progress Notes (Signed)
Clinical Social Work:  Weekend Coverage  Patient is medically stable to be discharged back to nursing home today: [facility].  Patient will transport by EMS in which CSW has arranged.  [facility] was contacted and agreeable and all dc paperwork was faxed to facility.  Family aware of dc.    CSW will arrange for EMS to be called at 12:00 today.  No other needs at this time.  DC back to SNF.  Ashley Jacobs, MSW LCSW [phone]

## 2011-08-14 ENCOUNTER — Encounter (HOSPITAL_COMMUNITY): Payer: Self-pay | Admitting: Emergency Medicine

## 2011-08-14 ENCOUNTER — Inpatient Hospital Stay (HOSPITAL_COMMUNITY): Payer: Medicare Other

## 2011-08-14 ENCOUNTER — Emergency Department (HOSPITAL_COMMUNITY): Payer: Medicare Other

## 2011-08-14 ENCOUNTER — Inpatient Hospital Stay (HOSPITAL_COMMUNITY)
Admission: EM | Admit: 2011-08-14 | Discharge: 2011-08-21 | DRG: 291 | Disposition: A | Discharge status: Skilled Nursing Facility | Payer: Medicare Other | Attending: Internal Medicine | Admitting: Internal Medicine

## 2011-08-14 DIAGNOSIS — J441 Chronic obstructive pulmonary disease with (acute) exacerbation: Secondary | ICD-10-CM

## 2011-08-14 DIAGNOSIS — R197 Diarrhea, unspecified: Secondary | ICD-10-CM

## 2011-08-14 DIAGNOSIS — N390 Urinary tract infection, site not specified: Secondary | ICD-10-CM | POA: Diagnosis present

## 2011-08-14 DIAGNOSIS — J449 Chronic obstructive pulmonary disease, unspecified: Secondary | ICD-10-CM

## 2011-08-14 DIAGNOSIS — J90 Pleural effusion, not elsewhere classified: Secondary | ICD-10-CM

## 2011-08-14 DIAGNOSIS — I2510 Atherosclerotic heart disease of native coronary artery without angina pectoris: Secondary | ICD-10-CM

## 2011-08-14 DIAGNOSIS — F05 Delirium due to known physiological condition: Secondary | ICD-10-CM | POA: Diagnosis present

## 2011-08-14 DIAGNOSIS — E039 Hypothyroidism, unspecified: Secondary | ICD-10-CM | POA: Diagnosis present

## 2011-08-14 DIAGNOSIS — J4489 Other specified chronic obstructive pulmonary disease: Secondary | ICD-10-CM | POA: Diagnosis present

## 2011-08-14 DIAGNOSIS — R109 Unspecified abdominal pain: Secondary | ICD-10-CM

## 2011-08-14 DIAGNOSIS — R4182 Altered mental status, unspecified: Secondary | ICD-10-CM

## 2011-08-14 DIAGNOSIS — D72829 Elevated white blood cell count, unspecified: Secondary | ICD-10-CM | POA: Diagnosis present

## 2011-08-14 DIAGNOSIS — E875 Hyperkalemia: Secondary | ICD-10-CM | POA: Diagnosis present

## 2011-08-14 DIAGNOSIS — B9620 Unspecified Escherichia coli [E. coli] as the cause of diseases classified elsewhere: Secondary | ICD-10-CM | POA: Diagnosis present

## 2011-08-14 DIAGNOSIS — G9341 Metabolic encephalopathy: Secondary | ICD-10-CM

## 2011-08-14 DIAGNOSIS — R142 Eructation: Secondary | ICD-10-CM | POA: Diagnosis present

## 2011-08-14 DIAGNOSIS — Z66 Do not resuscitate: Secondary | ICD-10-CM | POA: Diagnosis present

## 2011-08-14 DIAGNOSIS — I252 Old myocardial infarction: Secondary | ICD-10-CM

## 2011-08-14 DIAGNOSIS — E785 Hyperlipidemia, unspecified: Secondary | ICD-10-CM

## 2011-08-14 DIAGNOSIS — R509 Fever, unspecified: Secondary | ICD-10-CM

## 2011-08-14 DIAGNOSIS — R141 Gas pain: Secondary | ICD-10-CM | POA: Diagnosis present

## 2011-08-14 DIAGNOSIS — R140 Abdominal distension (gaseous): Secondary | ICD-10-CM

## 2011-08-14 DIAGNOSIS — E871 Hypo-osmolality and hyponatremia: Secondary | ICD-10-CM

## 2011-08-14 DIAGNOSIS — I5033 Acute on chronic diastolic (congestive) heart failure: Secondary | ICD-10-CM | POA: Diagnosis present

## 2011-08-14 DIAGNOSIS — I6990 Unspecified sequelae of unspecified cerebrovascular disease: Secondary | ICD-10-CM

## 2011-08-14 DIAGNOSIS — I10 Essential (primary) hypertension: Secondary | ICD-10-CM | POA: Diagnosis present

## 2011-08-14 DIAGNOSIS — IMO0001 Reserved for inherently not codable concepts without codable children: Secondary | ICD-10-CM | POA: Diagnosis present

## 2011-08-14 DIAGNOSIS — E222 Syndrome of inappropriate secretion of antidiuretic hormone: Secondary | ICD-10-CM | POA: Diagnosis present

## 2011-08-14 DIAGNOSIS — I509 Heart failure, unspecified: Secondary | ICD-10-CM

## 2011-08-14 DIAGNOSIS — R410 Disorientation, unspecified: Secondary | ICD-10-CM | POA: Diagnosis present

## 2011-08-14 DIAGNOSIS — E119 Type 2 diabetes mellitus without complications: Secondary | ICD-10-CM | POA: Diagnosis present

## 2011-08-14 DIAGNOSIS — E782 Mixed hyperlipidemia: Secondary | ICD-10-CM

## 2011-08-14 DIAGNOSIS — I4891 Unspecified atrial fibrillation: Secondary | ICD-10-CM | POA: Diagnosis present

## 2011-08-14 DIAGNOSIS — R0602 Shortness of breath: Secondary | ICD-10-CM

## 2011-08-14 DIAGNOSIS — F0390 Unspecified dementia without behavioral disturbance: Secondary | ICD-10-CM | POA: Diagnosis present

## 2011-08-14 LAB — DIFFERENTIAL
Basophils Absolute: 0 10*3/uL (ref 0.0–0.1)
Basophils Relative: 0 % (ref 0–1)
Eosinophils Absolute: 0.3 10*3/uL (ref 0.0–0.7)
Eosinophils Relative: 3 % (ref 0–5)
Monocytes Absolute: 0.6 10*3/uL (ref 0.1–1.0)
Monocytes Relative: 6 % (ref 3–12)

## 2011-08-14 LAB — URINALYSIS, ROUTINE W REFLEX MICROSCOPIC
Bilirubin Urine: NEGATIVE
Hgb urine dipstick: NEGATIVE
Ketones, ur: NEGATIVE mg/dL
Nitrite: POSITIVE — AB
Urobilinogen, UA: 0.2 mg/dL (ref 0.0–1.0)

## 2011-08-14 LAB — COMPREHENSIVE METABOLIC PANEL
ALT: 11 U/L (ref 0–35)
AST: 12 U/L (ref 0–37)
CO2: 31 mEq/L (ref 19–32)
Calcium: 8.2 mg/dL — ABNORMAL LOW (ref 8.4–10.5)
Creatinine, Ser: 0.74 mg/dL (ref 0.50–1.10)
GFR calc Af Amer: 84 mL/min — ABNORMAL LOW (ref >90)
GFR calc non Af Amer: 73 mL/min — ABNORMAL LOW (ref >90)
Glucose, Bld: 128 mg/dL — ABNORMAL HIGH (ref 70–99)
Sodium: 120 mEq/L — ABNORMAL LOW (ref 135–145)
Total Protein: 6.1 g/dL (ref 6.0–8.3)

## 2011-08-14 LAB — CBC
HCT: 36.5 % (ref 36.0–46.0)
Hemoglobin: 12 g/dL (ref 12.0–15.0)
MCH: 29.7 pg (ref 26.0–34.0)
MCHC: 32.9 g/dL (ref 30.0–36.0)
MCV: 90.3 fL (ref 78.0–100.0)
RDW: 14 % (ref 11.5–15.5)

## 2011-08-14 LAB — URINE MICROSCOPIC-ADD ON

## 2011-08-14 LAB — GLUCOSE, CAPILLARY: Glucose-Capillary: 144 mg/dL — ABNORMAL HIGH (ref 70–99)

## 2011-08-14 LAB — POCT I-STAT TROPONIN I

## 2011-08-14 MED ORDER — ACETAMINOPHEN 325 MG PO TABS
650.0000 mg | ORAL_TABLET | ORAL | Status: DC | PRN
  Filled 2011-08-14: qty 2

## 2011-08-14 MED ORDER — ENOXAPARIN SODIUM 40 MG/0.4ML ~~LOC~~ SOLN
40.0000 mg | Freq: Every day | SUBCUTANEOUS | Status: DC
Start: 2011-08-14 — End: 2011-08-21
  Administered 2011-08-15 – 2011-08-20 (×7): 40 mg via SUBCUTANEOUS
  Filled 2011-08-14 (×8): qty 0.4

## 2011-08-14 MED ORDER — PANTOPRAZOLE SODIUM 40 MG PO TBEC
40.0000 mg | DELAYED_RELEASE_TABLET | Freq: Every day | ORAL | Status: DC
  Administered 2011-08-15 – 2011-08-21 (×7): 40 mg via ORAL
  Filled 2011-08-14 (×8): qty 1

## 2011-08-14 MED ORDER — FUROSEMIDE 10 MG/ML IJ SOLN
40.0000 mg | Freq: Every day | INTRAMUSCULAR | Status: DC
  Filled 2011-08-14: qty 4

## 2011-08-14 MED ORDER — ALBUTEROL SULFATE (5 MG/ML) 0.5% IN NEBU: 2.5000 mg | INHALATION_SOLUTION | RESPIRATORY_TRACT | Status: DC | PRN

## 2011-08-14 MED ORDER — PREDNISONE 50 MG PO TABS
60.0000 mg | ORAL_TABLET | ORAL | Status: AC
  Administered 2011-08-15: 60 mg via ORAL
  Filled 2011-08-14: qty 1

## 2011-08-14 MED ORDER — FUROSEMIDE 10 MG/ML IJ SOLN
60.0000 mg | Freq: Once | INTRAMUSCULAR | Status: AC
  Administered 2011-08-14: 60 mg via INTRAVENOUS
  Filled 2011-08-14: qty 4

## 2011-08-14 MED ORDER — SODIUM CHLORIDE 0.9 % IJ SOLN
3.0000 mL | Freq: Two times a day (BID) | INTRAMUSCULAR | Status: DC
  Administered 2011-08-15 – 2011-08-21 (×11): 3 mL via INTRAVENOUS

## 2011-08-14 MED ORDER — AZITHROMYCIN 250 MG PO TABS
250.0000 mg | ORAL_TABLET | Freq: Every day | ORAL | Status: DC
  Filled 2011-08-14: qty 1

## 2011-08-14 MED ORDER — SIMVASTATIN 20 MG PO TABS
20.0000 mg | ORAL_TABLET | Freq: Every day | ORAL | Status: DC
  Administered 2011-08-15 – 2011-08-16 (×2): 20 mg via ORAL
  Filled 2011-08-14 (×3): qty 1

## 2011-08-14 MED ORDER — SODIUM CHLORIDE 0.9 % IV SOLN: 250.0000 mL | INTRAVENOUS | Status: DC | PRN

## 2011-08-14 MED ORDER — DEXTROSE 5 % IV SOLN
1.0000 g | Freq: Once | INTRAVENOUS | Status: AC
  Administered 2011-08-14: 1 g via INTRAVENOUS
  Filled 2011-08-14: qty 10

## 2011-08-14 MED ORDER — ALBUTEROL SULFATE (5 MG/ML) 0.5% IN NEBU
5.0000 mg | INHALATION_SOLUTION | Freq: Once | RESPIRATORY_TRACT | Status: AC
  Administered 2011-08-14: 5 mg via RESPIRATORY_TRACT
  Filled 2011-08-14: qty 1

## 2011-08-14 MED ORDER — ALPRAZOLAM 0.25 MG PO TABS: 0.2500 mg | ORAL_TABLET | Freq: Two times a day (BID) | ORAL | Status: DC | PRN

## 2011-08-14 MED ORDER — PREDNISONE (PAK) 10 MG PO TABS
60.0000 mg | ORAL_TABLET | Freq: Every morning | ORAL | Status: DC
  Filled 2011-08-14: qty 21

## 2011-08-14 MED ORDER — METOPROLOL TARTRATE 50 MG PO TABS
50.0000 mg | ORAL_TABLET | Freq: Two times a day (BID) | ORAL | Status: DC
  Administered 2011-08-15 – 2011-08-21 (×14): 50 mg via ORAL
  Filled 2011-08-14 (×15): qty 1

## 2011-08-14 MED ORDER — ONDANSETRON HCL 4 MG/2ML IJ SOLN: 4.0000 mg | Freq: Four times a day (QID) | INTRAMUSCULAR | Status: DC | PRN

## 2011-08-14 MED ORDER — AZITHROMYCIN 500 MG PO TABS
500.0000 mg | ORAL_TABLET | Freq: Every day | ORAL | Status: AC
  Administered 2011-08-15: 500 mg via ORAL
  Filled 2011-08-14: qty 1

## 2011-08-14 MED ORDER — IPRATROPIUM BROMIDE 0.02 % IN SOLN: 0.5000 mg | RESPIRATORY_TRACT | Status: DC | PRN

## 2011-08-14 MED ORDER — SODIUM CHLORIDE 0.9 % IJ SOLN: 3.0000 mL | INTRAMUSCULAR | Status: DC | PRN

## 2011-08-14 MED ORDER — HYDRALAZINE HCL 25 MG PO TABS
25.0000 mg | ORAL_TABLET | Freq: Four times a day (QID) | ORAL | Status: DC
  Administered 2011-08-15 – 2011-08-20 (×22): 25 mg via ORAL
  Filled 2011-08-14 (×26): qty 1

## 2011-08-14 MED ORDER — OCUVITE PO TABS
1.0000 | ORAL_TABLET | Freq: Every day | ORAL | Status: DC
  Administered 2011-08-16 – 2011-08-21 (×6): 1 via ORAL
  Filled 2011-08-14 (×8): qty 1

## 2011-08-14 MED ORDER — GUAIFENESIN ER 600 MG PO TB12
600.0000 mg | ORAL_TABLET | Freq: Two times a day (BID) | ORAL | Status: DC
  Administered 2011-08-15 – 2011-08-21 (×14): 600 mg via ORAL
  Filled 2011-08-14 (×15): qty 1

## 2011-08-14 MED ORDER — INSULIN ASPART 100 UNIT/ML ~~LOC~~ SOLN
0.0000 [IU] | Freq: Every day | SUBCUTANEOUS | Status: DC
  Administered 2011-08-15 – 2011-08-16 (×2): 3 [IU] via SUBCUTANEOUS
  Administered 2011-08-18: 2 [IU] via SUBCUTANEOUS
  Administered 2011-08-19: 3 [IU] via SUBCUTANEOUS

## 2011-08-14 MED ORDER — INSULIN GLARGINE 100 UNIT/ML ~~LOC~~ SOLN: 58.0000 [IU] | Freq: Every day | SUBCUTANEOUS | Status: DC

## 2011-08-14 MED ORDER — IPRATROPIUM BROMIDE 0.02 % IN SOLN
0.5000 mg | Freq: Once | RESPIRATORY_TRACT | Status: AC
  Administered 2011-08-14: 0.5 mg via RESPIRATORY_TRACT
  Filled 2011-08-14: qty 2.5

## 2011-08-14 MED ORDER — DEXTROSE 5 % IV SOLN
1.0000 g | INTRAVENOUS | Status: DC
  Administered 2011-08-15 (×2): 1 g via INTRAVENOUS
  Filled 2011-08-14 (×3): qty 10

## 2011-08-14 MED ORDER — LEVOTHYROXINE SODIUM 100 MCG PO TABS
100.0000 ug | ORAL_TABLET | Freq: Every day | ORAL | Status: DC
  Administered 2011-08-15 – 2011-08-21 (×7): 100 ug via ORAL
  Filled 2011-08-14 (×8): qty 1

## 2011-08-14 MED ORDER — INSULIN ASPART 100 UNIT/ML ~~LOC~~ SOLN
0.0000 [IU] | Freq: Three times a day (TID) | SUBCUTANEOUS | Status: DC
  Administered 2011-08-15: 8 [IU] via SUBCUTANEOUS
  Administered 2011-08-15 – 2011-08-16 (×3): 5 [IU] via SUBCUTANEOUS
  Administered 2011-08-16: 3 [IU] via SUBCUTANEOUS
  Administered 2011-08-16 – 2011-08-17 (×3): 5 [IU] via SUBCUTANEOUS
  Administered 2011-08-17: 3 [IU] via SUBCUTANEOUS
  Administered 2011-08-18: 5 [IU] via SUBCUTANEOUS
  Administered 2011-08-18: 8 [IU] via SUBCUTANEOUS
  Administered 2011-08-19: 5 [IU] via SUBCUTANEOUS
  Administered 2011-08-19: 2 [IU] via SUBCUTANEOUS
  Administered 2011-08-19: 5 [IU] via SUBCUTANEOUS
  Administered 2011-08-20: 11 [IU] via SUBCUTANEOUS
  Administered 2011-08-20: 2 [IU] via SUBCUTANEOUS
  Administered 2011-08-20: 5 [IU] via SUBCUTANEOUS
  Administered 2011-08-21: 3 [IU] via SUBCUTANEOUS
  Administered 2011-08-21: 8 [IU] via SUBCUTANEOUS

## 2011-08-14 MED ORDER — ASPIRIN 81 MG PO CHEW
81.0000 mg | CHEWABLE_TABLET | Freq: Every day | ORAL | Status: DC
  Administered 2011-08-15 – 2011-08-21 (×7): 81 mg via ORAL
  Filled 2011-08-14 (×7): qty 1

## 2011-08-14 MED ORDER — PREDNISONE 50 MG PO TABS
60.0000 mg | ORAL_TABLET | Freq: Every day | ORAL | Status: AC
  Administered 2011-08-15 – 2011-08-16 (×2): 60 mg via ORAL
  Filled 2011-08-14 (×3): qty 1

## 2011-08-14 MED ORDER — POLYETHYLENE GLYCOL 3350 17 G PO PACK
17.0000 g | PACK | Freq: Every day | ORAL | Status: DC
  Administered 2011-08-15 – 2011-08-21 (×7): 17 g via ORAL
  Filled 2011-08-14 (×7): qty 1

## 2011-08-14 MED ORDER — DULOXETINE HCL 30 MG PO CPEP
30.0000 mg | ORAL_CAPSULE | Freq: Every day | ORAL | Status: DC
  Filled 2011-08-14: qty 1

## 2011-08-14 MED ORDER — ACETAMINOPHEN 325 MG PO TABS: 650.0000 mg | ORAL_TABLET | ORAL | Status: DC | PRN

## 2011-08-14 NOTE — ED Notes (Signed)
[id number]<AV> Expected date:<BR> Expected time:<BR> Means of arrival:<BR> Comments:<BR> Hold per charge

## 2011-08-14 NOTE — ED Provider Notes (Cosign Needed)
History     CSN: [id number]  Arrival date & time 08/14/11  1412   First MD Initiated Contact with Patient 08/14/11 1459      Chief complaint confusion  (Consider location/radiation/quality/duration/timing/severity/associated sxs/prior treatment) HPI  History obtained from patient's daughter. She states 2 days ago when she went to visit her mother her mother did not recognize her as being her daughter. She relates finally she did realize she was her daughter. She also reported seeing her deceased husband at the end of the hall. Daughter states today she noted the patient had swelling of her right ankle and her left hand. They also thought she possibly started wheezing yesterday. Daughter states she thought with the confusion 2 days ago she possibly had a UTI and requested a urine to be done at the nursing home. They were waiting on a urine culture to be done to just determine treatment. Daughter states that she was admitted in March with pneumonia and she was also hospitalized in April and is currently getting physical therapy to get her strength back.  PCP Dr. Paulette Blanch  Past Medical History  Diagnosis Date  . Dysphagia   . Diabetes mellitus   . Hypertension   . Hypothyroidism   . Hyperlipemia   . GERD (gastroesophageal reflux disease)   . Depression   . Anxiety   . Acute MI   . Pneumonia   . Stroke   . UTI (urinary tract infection)   . COPD (chronic obstructive pulmonary disease)   . CAD (coronary artery disease)   . Late effects of CVA (cerebrovascular accident)     Past Surgical History  Procedure Date  . Back surgery     No family history on file.  History  Substance Use Topics  . Smoking status: Never Smoker   . Smokeless tobacco: Not on file  . Alcohol Use: No   lives in nursing home  OB History    Grav Para Term Preterm Abortions TAB SAB Ect Mult Living                  Review of Systems  Unable to perform ROS: Other    Allergies  Review of patient's  allergies indicates no known allergies.  Home Medications   Current Outpatient Rx  Name Route Sig Dispense Refill  . ACETAMINOPHEN 325 MG PO TABS Oral Take 650 mg by mouth every 4 (four) hours as needed. For pain.    . ALBUTEROL SULFATE (5 MG/ML) 0.5% IN NEBU Nebulization Take 0.5 mLs (2.5 mg total) by nebulization every 2 (two) hours as needed for wheezing or shortness of breath. 20 mL   . ALPRAZOLAM 0.25 MG PO TABS Oral Take 0.25 mg by mouth 2 (two) times daily.      Marland Kitchen AMLODIPINE BESYLATE 10 MG PO TABS Oral Take 10 mg by mouth daily.      . ASPIRIN 81 MG PO CHEW Oral Chew 81 mg by mouth daily.    . OCUVITE PO TABS Oral Take 1 tablet by mouth daily.      . DULOXETINE HCL 30 MG PO CPEP Oral Take 30 mg by mouth at bedtime.      . FUROSEMIDE 20 MG PO TABS Oral Take 20 mg by mouth daily.      Marland Kitchen GLUCOSE PO LIQD Oral Take 15 g by mouth as needed. If CBG < 60.    . HYDRALAZINE HCL 25 MG PO TABS Oral Take 1 tablet (25 mg total) by mouth every  6 (six) hours.    . INSULIN ASPART 100 UNIT/ML [place] SOLN Subcutaneous Inject 4 Units into the skin 3 (three) times daily with meals.     . INSULIN GLARGINE 100 UNIT/ML [place] SOLN Subcutaneous Inject 58 Units into the skin at bedtime.    . IPRATROPIUM BROMIDE 0.02 % IN SOLN Nebulization Take 0.5 mg by nebulization every 2 (two) hours as needed. For shortness of breath.    Marland Kitchen LEVOTHYROXINE SODIUM 100 MCG PO TABS Oral Take 1 tablet (100 mcg total) by mouth daily before breakfast.    . LOSARTAN POTASSIUM 100 MG PO TABS Oral Take 100 mg by mouth daily.      Marland Kitchen METFORMIN HCL 500 MG PO TABS Oral Take 500 mg by mouth 2 (two) times daily with a meal.      . METOPROLOL TARTRATE 50 MG PO TABS Oral Take 50 mg by mouth 2 (two) times daily.      Marland Kitchen OMEPRAZOLE 20 MG PO CPDR Oral Take 20 mg by mouth 2 (two) times daily.      Marland Kitchen POLYETHYLENE GLYCOL 3350 PO PACK Oral Take 17 g by mouth daily.      Marland Kitchen POTASSIUM CHLORIDE 20 MEQ PO PACK Oral Take 20 mEq by mouth daily.      Marland Kitchen SIMVASTATIN  20 MG PO TABS Oral Take 20 mg by mouth daily.        BP 171/61  Pulse 72  Temp 97.7 F (36.5 C)  Resp 20  Ht 5\' 6"  (1.676 m)  Wt 190 lb (86.183 kg)  BMI 30.67 kg/m2  SpO2 100%  Vital signs normal except hypertension   Physical Exam  Nursing note and vitals reviewed. Constitutional: She appears well-developed and well-nourished.  Non-toxic appearance. She does not appear ill. She appears distressed.       Patient does not answer most questions  HENT:  Head: Normocephalic and atraumatic.  Right Ear: External ear normal.  Left Ear: External ear normal.  Nose: Nose normal. No mucosal edema or rhinorrhea.  Mouth/Throat: Oropharynx is clear and moist and mucous membranes are normal. No dental abscesses or uvula swelling.  Eyes: Conjunctivae and EOM are normal. Pupils are equal, round, and reactive to light.  Neck: Normal range of motion and full passive range of motion without pain. Neck supple.  Cardiovascular: Normal rate, regular rhythm and normal heart sounds.  Exam reveals no gallop and no friction rub.   No murmur heard. Pulmonary/Chest: She is in respiratory distress. She has wheezes. She has no rhonchi. She has no rales. She exhibits no tenderness and no crepitus.       Patient having audible expiratory wheezing, also having some retractions  Abdominal: Soft. Normal appearance and bowel sounds are normal. She exhibits no distension. There is no tenderness. There is no rebound and no guarding.  Musculoskeletal: Normal range of motion. She exhibits edema. She exhibits no tenderness.       Patient noted to have pain edema of both look stream is but the right is worse than left, she also is noted to have some swelling of her left hand with some bruising on the radial aspect of her wrist. She also has some bruising on her right elbow however she moves both well without apparent discomfort   Neurological: She is alert. She has normal strength. No cranial nerve deficit.  Skin: Skin is  warm, dry and intact. No rash noted. No erythema. No pallor.  Psychiatric: She has a normal mood and affect.  Her speech is normal and behavior is normal. Her mood appears not anxious.    ED Course  Procedures (including critical care time)    Medications  ipratropium (ATROVENT) 0.02 % nebulizer solution (not administered)  albuterol (PROVENTIL) (5 MG/ML) 0.5% nebulizer solution 5 mg (5 mg Nebulization Given 08/14/11 1601)  ipratropium (ATROVENT) nebulizer solution 0.5 mg (0.5 mg Nebulization Given 08/14/11 1601)  furosemide (LASIX) injection 60 mg (60 mg Intravenous Given 08/14/11 1830)  cefTRIAXone (ROCEPHIN) 1 g in dextrose 5 % 50 mL IVPB (1 g Intravenous Given 08/14/11 1904)   Recheck at 1755 chest patient no longer having audible wheezing, when I listen to her I do hear some faint end expiratory wheezing however her retractions or respiratory distress are gone. Lab was in the room drawing her CMETand BMP. Lasix ordered.  1950 Dr Adela Glimpse admit to tele team 2   Results for orders placed during the hospital encounter of 08/14/11  CBC      Component Value Range   WBC 9.3  4.0 - 10.5 (K/uL)   RBC 4.04  3.87 - 5.11 (MIL/uL)   Hemoglobin 12.0  12.0 - 15.0 (g/dL)   HCT 40.9  81.1 - 91.4 (%)   MCV 90.3  78.0 - 100.0 (fL)   MCH 29.7  26.0 - 34.0 (pg)   MCHC 32.9  30.0 - 36.0 (g/dL)   RDW 78.2  95.6 - 21.3 (%)   Platelets 172  150 - 400 (K/uL)  DIFFERENTIAL      Component Value Range   Neutrophils Relative 77  43 - 77 (%)   Neutro Abs 7.1  1.7 - 7.7 (K/uL)   Lymphocytes Relative 14  12 - 46 (%)   Lymphs Abs 1.3  0.7 - 4.0 (K/uL)   Monocytes Relative 6  3 - 12 (%)   Monocytes Absolute 0.6  0.1 - 1.0 (K/uL)   Eosinophils Relative 3  0 - 5 (%)   Eosinophils Absolute 0.3  0.0 - 0.7 (K/uL)   Basophils Relative 0  0 - 1 (%)   Basophils Absolute 0.0  0.0 - 0.1 (K/uL)  URINALYSIS, ROUTINE W REFLEX MICROSCOPIC      Component Value Range   Color, Urine YELLOW  YELLOW    APPearance CLOUDY  (*) CLEAR    Specific Gravity, Urine 1.013  1.005 - 1.030    pH 6.0  5.0 - 8.0    Glucose, UA NEGATIVE  NEGATIVE (mg/dL)   Hgb urine dipstick NEGATIVE  NEGATIVE    Bilirubin Urine NEGATIVE  NEGATIVE    Ketones, ur NEGATIVE  NEGATIVE (mg/dL)   Protein, ur 30 (*) NEGATIVE (mg/dL)   Urobilinogen, UA 0.2  0.0 - 1.0 (mg/dL)   Nitrite POSITIVE (*) NEGATIVE    Leukocytes, UA MODERATE (*) NEGATIVE   POCT I-STAT TROPONIN I      Component Value Range   Troponin i, poc 0.02  0.00 - 0.08 (ng/mL)   Comment 3           COMPREHENSIVE METABOLIC PANEL      Component Value Range   Sodium 120 (*) 135 - 145 (mEq/L)   Potassium 5.2 (*) 3.5 - 5.1 (mEq/L)   Chloride 82 (*) 96 - 112 (mEq/L)   CO2 31  19 - 32 (mEq/L)   Glucose, Bld 128 (*) 70 - 99 (mg/dL)   BUN 14  6 - 23 (mg/dL)   Creatinine, Ser 0.86  0.50 - 1.10 (mg/dL)   Calcium 8.2 (*) 8.4 - 10.5 (  mg/dL)   Total Protein 6.1  6.0 - 8.3 (g/dL)   Albumin 3.2 (*) 3.5 - 5.2 (g/dL)   AST 12  0 - 37 (U/L)   ALT 11  0 - 35 (U/L)   Alkaline Phosphatase 136 (*) 39 - 117 (U/L)   Total Bilirubin 0.3  0.3 - 1.2 (mg/dL)   GFR calc non Af Amer 73 (*) >90 (mL/min)   GFR calc Af Amer 84 (*) >90 (mL/min)  PRO B NATRIURETIC PEPTIDE      Component Value Range   Pro B Natriuretic peptide (BNP) 1067.0 (*) 0 - 450 (pg/mL)  URINE MICROSCOPIC-ADD ON      Component Value Range   Squamous Epithelial / LPF RARE  RARE    WBC, UA 11-20  <3 (WBC/hpf)   RBC / HPF 0-2  <3 (RBC/hpf)   Bacteria, UA MANY (*) RARE    Laboratory interpretation all normal except poss uti, hyponatremia, mildly elevated BNP   Dg Chest Port 1 View  08/14/2011  *RADIOLOGY REPORT*  Clinical Data: Shortness of breath, confusion and wheezing.  The  PORTABLE CHEST - 1 VIEW  Comparison: 06/26/2011  Findings: There likely is a chronic component of mild interstitial edema and chronic lung disease.  There is no evidence of overt airspace edema or pleural fluid.  Heart size is stable.  IMPRESSION: Chronic  lung disease and probable chronic component of interstitial edema.  Original Report Authenticated By: Reola Calkins, M.D.    Date: 08/14/2011  Rate: 72  Rhythm: normal sinus rhythm  QRS Axis: normal  Intervals: normal  ST/T Wave abnormalities: normal  Conduction Disutrbances:nonspecific intraventricular conduction delay  Narrative Interpretation:   Old EKG Reviewed: unchanged from 02/02/2011     1. Altered mental status   2. Urinary tract infection   3. Hyponatremia   4. COPD with exacerbation     Plan admission  Devoria Albe, MD, FACEP   MDM          Ward Givens, MD 08/14/11 [phone]

## 2011-08-14 NOTE — H&P (Addendum)
PCP:   Colon Branch, MD, MD At [facility]   Chief Complaint:   confusion  HPI: [name] is a 78 y.o. female   has a past medical history of Dysphagia; Diabetes mellitus; Hypertension; Hypothyroidism; Hyperlipemia; GERD (gastroesophageal reflux disease); Depression; Anxiety; Acute MI; Pneumonia; Stroke; UTI (urinary tract infection); COPD (chronic obstructive pulmonary disease); CAD (coronary artery disease); Late effects of CVA (cerebrovascular accident); and DEMENTIA.   Presented with  She seemed confused since last week. Somewhat more lethargic than usual. She does have occasional episodes of hypoglycemia. No recent fever or chills. Today she had some increased peripheral edema. Since Sunday she have had increased work of breathing and needed more oxygen. Since Easter she has required some O2. She has been wheezing for past week and have had a bit of cough today. She has been more lethargic. Since Easter she has been on increased dose on benzos.  Review of Systems:    Pertinent positives include: confusion.  Constitutional:  No weight loss, night sweats, Fevers, chills, fatigue, weight loss  HEENT:  No headaches, Difficulty swallowing,Tooth/dental problems,Sore throat,  No sneezing, itching, ear ache, nasal congestion, post nasal drip,  Cardio-vascular:  No chest pain, Orthopnea, PND, anasarca, dizziness, palpitations.no Bilateral lower extremity swelling  GI:  No heartburn, indigestion, abdominal pain, nausea, vomiting, diarrhea, change in bowel habits, loss of appetite, melena, blood in stool, hematemesis Resp:  no shortness of breath at rest. No dyspnea on exertion, No excess mucus, no productive cough, No non-productive cough, No coughing up of blood.No change in color of mucus.No wheezing. Skin:  no rash or lesions. No jaundice GU:  no dysuria, change in color of urine, no urgency or frequency. No straining to urinate.  No flank pain.  Musculoskeletal:    No joint pain or no joint swelling. No decreased range of motion. No back pain.  Psych:  No change in mood or affect. No depression or anxiety. No memory loss.  Neuro: no localizing neurological complaints, no tingling, no weakness, no double vision, no gait abnormality, no slurred speech, no confusion  Otherwise ROS are negative except for above, 10 systems were reviewed  Past Medical History: Past Medical History  Diagnosis Date  . Dysphagia   . Diabetes mellitus   . Hypertension   . Hypothyroidism   . Hyperlipemia   . GERD (gastroesophageal reflux disease)   . Depression   . Anxiety   . Acute MI   . Pneumonia   . Stroke   . UTI (urinary tract infection)   . COPD (chronic obstructive pulmonary disease)   . CAD (coronary artery disease)   . Late effects of CVA (cerebrovascular accident)   . DEMENTIA    Past Surgical History  Procedure Date  . Back surgery      Medications: Prior to Admission medications   Medication Sig Start Date End Date Taking? Authorizing Provider  acetaminophen (TYLENOL) 325 MG tablet Take 650 mg by mouth every 4 (four) hours as needed. For pain.   Yes Historical Provider, MD  albuterol (PROVENTIL) (5 MG/ML) 0.5% nebulizer solution Take 0.5 mLs (2.5 mg total) by nebulization every 2 (two) hours as needed for wheezing or shortness of breath. 06/26/11 06/25/12 Yes Joseph Art, DO  ALPRAZolam (XANAX) 0.25 MG tablet Take 0.25 mg by mouth 2 (two) times daily.     Yes Historical Provider, MD  amLODipine (NORVASC) 10 MG tablet Take 10 mg by mouth daily.     Yes Historical Provider, MD  aspirin 81 MG chewable tablet Chew 81 mg by mouth daily.   Yes Historical Provider, MD  beta carotene w/minerals (OCUVITE) tablet Take 1 tablet by mouth daily.     Yes Historical Provider, MD  DULoxetine (CYMBALTA) 30 MG capsule Take 30 mg by mouth at bedtime.     Yes Historical Provider, MD  furosemide (LASIX) 20 MG tablet Take 20 mg by mouth daily.     Yes Historical  Provider, MD  Glucose LIQD Take 15 g by mouth as needed. If CBG < 60.   Yes Historical Provider, MD  hydrALAZINE (APRESOLINE) 25 MG tablet Take 1 tablet (25 mg total) by mouth every 6 (six) hours. 06/28/11 06/27/12 Yes Jessica U Vann, DO  insulin aspart (NOVOLOG) 100 UNIT/ML injection Inject 4 Units into the skin 3 (three) times daily with meals.    Yes Historical Provider, MD  insulin glargine (LANTUS) 100 UNIT/ML injection Inject 58 Units into the skin at bedtime. 06/28/11 06/27/12 Yes Jessica U Vann, DO  ipratropium (ATROVENT) 0.02 % nebulizer solution Take 0.5 mg by nebulization every 2 (two) hours as needed. For shortness of breath. 06/26/11 06/25/12 Yes Joseph Art, DO  levothyroxine (SYNTHROID, LEVOTHROID) 100 MCG tablet Take 1 tablet (100 mcg total) by mouth daily before breakfast. 06/26/11 06/25/12 Yes Jessica U Vann, DO  losartan (COZAAR) 100 MG tablet Take 100 mg by mouth daily.     Yes Historical Provider, MD  metFORMIN (GLUCOPHAGE) 500 MG tablet Take 500 mg by mouth 2 (two) times daily with a meal.     Yes Historical Provider, MD  metoprolol (LOPRESSOR) 50 MG tablet Take 50 mg by mouth 2 (two) times daily.     Yes Historical Provider, MD  omeprazole (PRILOSEC) 20 MG capsule Take 20 mg by mouth 2 (two) times daily.     Yes Historical Provider, MD  polyethylene glycol (MIRALAX / GLYCOLAX) packet Take 17 g by mouth daily.     Yes Historical Provider, MD  potassium chloride (KLOR-CON) 20 MEQ packet Take 20 mEq by mouth daily.     Yes Historical Provider, MD  simvastatin (ZOCOR) 20 MG tablet Take 20 mg by mouth daily.     Yes Historical Provider, MD    Allergies:  No Known Allergies  Social History:  Ambulatory wheelchair bound Lives at SNF   reports that she has never smoked. She does not have any smokeless tobacco history on file. She reports that she does not drink alcohol or use illicit drugs.   Family History: family history includes Hypertension in her brother and sister.    Physical  Exam: Patient Vitals for the past 24 hrs:  BP Temp Temp src Pulse Resp SpO2 Height Weight  08/14/11 1940 169/63 mmHg 97.7 F (36.5 C) Oral 82  20  96 % - -  08/14/11 1601 - - - - - 100 % - -  08/14/11 1425 171/61 mmHg 97.7 F (36.5 C) - 72  20  2 % 5\' 6"  (1.676 m) 86.183 kg (190 lb)    1. General:  in No Acute distress 2. Psychological: Alert and  Oriented to self and others. 3. Head/ENT:   Moist  Mucous Membranes                          Head Non traumatic, neck supple  Normal  Dentition 4. SKIN: normal  Skin turgor,  Skin clean Dry and intact no rash 5. Heart: Regular rate and rhythm no Murmur, Rub or gallop 6. Lungs: Clear to auscultation bilaterally, no wheezes patient unable to sit up  7. Abdomen: Soft, non-tender, somewhat distended 8. Lower extremities: no clubbing, cyanosis, or edema 9. Neurologically Grossly intact, moving all 4 extremities equally 10. MSK: Normal range of motion  body mass index is 30.67 kg/(m^2).   Labs on Admission:   [facility] 08/14/11 1756  NA 120*  K 5.2*  CL 82*  CO2 31  GLUCOSE 128*  BUN 14  CREATININE 0.74  CALCIUM 8.2*  MG --  PHOS --    Basename 08/14/11 1756  AST 12  ALT 11  ALKPHOS 136*  BILITOT 0.3  PROT 6.1  ALBUMIN 3.2*   No results found for this basename: LIPASE:2,AMYLASE:2 in the last 72 hours  Basename 08/14/11 1621  WBC 9.3  NEUTROABS 7.1  HGB 12.0  HCT 36.5  MCV 90.3  PLT 172   No results found for this basename: CKTOTAL:3,CKMB:3,CKMBINDEX:3,TROPONINI:3 in the last 72 hours No results found for this basename: TSH,T4TOTAL,FREET3,T3FREE,THYROIDAB in the last 72 hours No results found for this basename: VITAMINB12:2,FOLATE:2,FERRITIN:2,TIBC:2,IRON:2,RETICCTPCT:2 in the last 72 hours Lab Results  Component Value Date   HGBA1C 7.3* 06/22/2011    Estimated Creatinine Clearance: 51.7 ml/min (by C-G formula based on Cr of 0.74).   Other results:  I have pearsonaly reviewed this: ECG  REPORT  Rate: 72  Rhythm : NSR  ST&T Change : no ischemic changes   UA evidence of UTI BNP 1067.0     Radiological Exams on Admission: Dg Chest Port 1 View  08/14/2011  *RADIOLOGY REPORT*  Clinical Data: Shortness of breath, confusion and wheezing.  The  PORTABLE CHEST - 1 VIEW  Comparison: 06/26/2011  Findings: There likely is a chronic component of mild interstitial edema and chronic lung disease.  There is no evidence of overt airspace edema or pleural fluid.  Heart size is stable.  IMPRESSION: Chronic lung disease and probable chronic component of interstitial edema.  Original Report Authenticated By: Reola Calkins, M.D.    Assessment/Plan  78  Yo female in S. medical problems here with confusion possible COPD exacerbation versus heart failure and hyponatremia  Present on Admission:  .Confusion - likely multifactorial. Combination of hyponatremia and UTI contributing to metabolic encephalopathy. Will treat underlying cause is and see if improves  .SOB (shortness of breath) - this is also likely multifactorial patient has a history of COPD and also appears to be fluid overloaded we'll treat COPD and CHF and put on oxygen patient seemed to be improved with albuterol administration as well as Lasix  .Hyponatremia - appears to be hypervolemic hyponatremia we'll give Lasix with fluid restriction and monitor sodium carefully   .CHF exacerbation - patient appears to be fluid overloaded she received 60 mg of Lasix in the ER will watch carefully to avoid overdiuresis her Lasix should be adjusted pending her fluid status for now will write for 40 mg IV a day , will update echo gram patient is noted good candidate for ACE inhibitor or ARB given hyperkalemia  .COPD bronchitis - patient does smoke but carries a diagnosis of COPD. Will make sure patient is on nebulizers Mucinex,  cover lungs with Zithromax and Rocephin and give prednisone taper given that she was very wheezy when she first came in   .Hypertension - continue home dose will hold Cozaar given  hyperkalemia and Norvasc given Peripheral edema  .Hypothyroidism - continue levothyroxine  .CAD (coronary artery disease) - will cycle cardiac markers currently chest pain-free, watch in telemetry  .(lower urinary tract infection) - cover with Rocephin await results of urine culture  .Abdominal distention  - patient is prone to constipation we'll obtain KUB to determine stool burden also she could have fluid overload presenting as abdominal distention  .Hyperkalemia - hold Cozaar, receiving Lasix, monitor potassium level ,stop by mouth potassium replacement if continues to stay elevated will give Kayexalate   leg edema families concern for DVT I doubt it. Bilateral lid edema but will obtain Dopplers given the patient is immobile and at high risk for DVT  Diabetes - recently her Lantus has been decreased due to episodes of hypoglycemia, currently blood sugar in 120's will monitor carefully as her DM control can become difficult while she is on steroids. Prophylaxis: Lovenox, Protonix  CODE STATUS: DNR/DNI   I have spent a total of on this admission  Rettie Laird 08/14/2011, 8:16 PM

## 2011-08-14 NOTE — ED Notes (Signed)
Pt bought in by EMS from [facility] for altered mental status stated pt is confused and just not herself thinks possible UTI

## 2011-08-15 ENCOUNTER — Inpatient Hospital Stay (HOSPITAL_COMMUNITY): Payer: Medicare Other

## 2011-08-15 DIAGNOSIS — R197 Diarrhea, unspecified: Secondary | ICD-10-CM

## 2011-08-15 DIAGNOSIS — E782 Mixed hyperlipidemia: Secondary | ICD-10-CM

## 2011-08-15 DIAGNOSIS — I059 Rheumatic mitral valve disease, unspecified: Secondary | ICD-10-CM

## 2011-08-15 DIAGNOSIS — R0602 Shortness of breath: Secondary | ICD-10-CM

## 2011-08-15 DIAGNOSIS — I509 Heart failure, unspecified: Secondary | ICD-10-CM

## 2011-08-15 DIAGNOSIS — R609 Edema, unspecified: Secondary | ICD-10-CM

## 2011-08-15 DIAGNOSIS — J441 Chronic obstructive pulmonary disease with (acute) exacerbation: Secondary | ICD-10-CM

## 2011-08-15 LAB — BASIC METABOLIC PANEL
BUN: 13 mg/dL (ref 6–23)
CO2: 32 mEq/L (ref 19–32)
CO2: 30 mEq/L (ref 19–32)
CO2: 32 mEq/L (ref 19–32)
CO2: 32 mEq/L (ref 19–32)
Chloride: 83 mEq/L — ABNORMAL LOW (ref 96–112)
Chloride: 81 mEq/L — ABNORMAL LOW (ref 96–112)
Chloride: 84 mEq/L — ABNORMAL LOW (ref 96–112)
GFR calc Af Amer: 83 mL/min — ABNORMAL LOW (ref >90)
Glucose, Bld: 217 mg/dL — ABNORMAL HIGH (ref 70–99)
Glucose, Bld: 266 mg/dL — ABNORMAL HIGH (ref 70–99)
Glucose, Bld: 272 mg/dL — ABNORMAL HIGH (ref 70–99)
Potassium: 4.3 mEq/L (ref 3.5–5.1)
Potassium: 4.9 mEq/L (ref 3.5–5.1)
Potassium: 4.4 mEq/L (ref 3.5–5.1)
Potassium: 4.5 mEq/L (ref 3.5–5.1)
Sodium: 124 mEq/L — ABNORMAL LOW (ref 135–145)
Sodium: 120 mEq/L — ABNORMAL LOW (ref 135–145)
Sodium: 120 mEq/L — ABNORMAL LOW (ref 135–145)

## 2011-08-15 LAB — DIFFERENTIAL
Basophils Absolute: 0 10*3/uL (ref 0.0–0.1)
Eosinophils Relative: 3 % (ref 0–5)
Lymphocytes Relative: 13 % (ref 12–46)
Lymphs Abs: 1.2 10*3/uL (ref 0.7–4.0)
Monocytes Absolute: 0.7 10*3/uL (ref 0.1–1.0)

## 2011-08-15 LAB — CARDIAC PANEL(CRET KIN+CKTOT+MB+TROPI)
CK, MB: 3 ng/mL (ref 0.3–4.0)
Relative Index: INVALID (ref 0.0–2.5)
Relative Index: INVALID (ref 0.0–2.5)
Total CK: 36 U/L (ref 7–177)
Troponin I: <0.3 ng/mL (ref <0.30)

## 2011-08-15 LAB — GLUCOSE, CAPILLARY
Glucose-Capillary: 274 mg/dL — ABNORMAL HIGH (ref 70–99)
Glucose-Capillary: 275 mg/dL — ABNORMAL HIGH (ref 70–99)

## 2011-08-15 LAB — SODIUM, URINE, RANDOM: Sodium, Ur: 92 mEq/L

## 2011-08-15 LAB — CBC
HCT: 34 % — ABNORMAL LOW (ref 36.0–46.0)
MCV: 89.7 fL (ref 78.0–100.0)
RDW: 13.9 % (ref 11.5–15.5)
WBC: 9.3 10*3/uL (ref 4.0–10.5)

## 2011-08-15 LAB — MRSA PCR SCREENING: MRSA by PCR: NEGATIVE

## 2011-08-15 MED ORDER — DEXTROSE 5 % IV SOLN
500.0000 mg | INTRAVENOUS | Status: DC
  Administered 2011-08-15 – 2011-08-18 (×3): 500 mg via INTRAVENOUS
  Filled 2011-08-15 (×4): qty 500

## 2011-08-15 MED ORDER — INSULIN GLARGINE 100 UNIT/ML ~~LOC~~ SOLN
40.0000 [IU] | Freq: Every day | SUBCUTANEOUS | Status: DC
  Administered 2011-08-15 – 2011-08-20 (×6): 40 [IU] via SUBCUTANEOUS

## 2011-08-15 MED ORDER — BISACODYL 10 MG RE SUPP: 10.0000 mg | Freq: Every day | RECTAL | Status: DC | PRN

## 2011-08-15 MED ORDER — ENSURE COMPLETE PO LIQD
237.0000 mL | Freq: Every day | ORAL | Status: DC
  Administered 2011-08-16 – 2011-08-19 (×4): 237 mL via ORAL

## 2011-08-15 MED ORDER — TOLVAPTAN 15 MG PO TABS
15.0000 mg | ORAL_TABLET | ORAL | Status: DC
  Administered 2011-08-15 – 2011-08-17 (×3): 15 mg via ORAL
  Filled 2011-08-15 (×6): qty 1

## 2011-08-15 MED ORDER — LACTULOSE 10 GM/15ML PO SOLN
20.0000 g | Freq: Two times a day (BID) | ORAL | Status: DC
  Administered 2011-08-15 – 2011-08-21 (×13): 20 g via ORAL
  Filled 2011-08-15 (×15): qty 30

## 2011-08-15 NOTE — Progress Notes (Signed)
Physical Therapy Note  Order received and chart reviewed.  Pt requiring total assist for care at SNF and requiring 2 person assist for transfers (per speaking with acute OT) as well as nonambulatory.  Will defer PT needs to SNF.  Recommend nsg staff use lift equipment for OOB to chair.  PT to sign off.  Zenovia Jarred, PT Pager: [phone]

## 2011-08-15 NOTE — Progress Notes (Signed)
VASCULAR LAB PRELIMINARY  PRELIMINARY  PRELIMINARY  PRELIMINARY  Bilateral lower extremity venous duplex completed.    Preliminary report:  Bilateral:  No evidence of DVT, superficial thrombosis, or Baker's Cyst.   [name] D, RVS 08/15/2011, 9:22 AM

## 2011-08-15 NOTE — Clinical Social Work Placement (Unsigned)
     Clinical Social Work Department CLINICAL SOCIAL WORK PLACEMENT NOTE 08/15/2011  Patient:  [name], [name]  Account Number:  [id number] Admit date:  08/14/2011  Clinical Social Worker:  Becky Sax, LCSW  Date/time:  08/15/2011 12:00 M  Clinical Social Work is seeking post-discharge placement for this patient at the following level of care:   SKILLED NURSING   (*CSW will update this form in Epic as items are completed)   08/15/2011  Patient/family provided with [facility] Department of Clinical Social Works list of facilities offering this level of care within the geographic area requested by the patient (or if unable, by the patients family).  08/15/2011  Patient/family informed of their freedom to choose among providers that offer the needed level of care, that participate in Medicare, Medicaid or managed care program needed by the patient, have an available bed and are willing to accept the patient.  08/15/2011  Patient/family informed of [facility] ownership interest in [facility], as well as of the fact that they are under no obligation to receive care at this facility.  PASARR submitted to EDS on 08/15/2011 PASARR number received from EDS on 08/15/2011  FL2 transmitted to all facilities in geographic area requested by pt/family on  08/15/2011 FL2 transmitted to all facilities within larger geographic area on   Patient informed that his/her managed care company has contracts with or will negotiate with  certain facilities, including the following:     Patient/family informed of bed offers received:  08/15/2011 Patient chooses bed at [facility] Physician recommends and patient chooses bed at    Patient to be transferred to [facility] on   Patient to be transferred to facility by [facility]  The following physician request were entered in Epic:   Additional Comments:

## 2011-08-15 NOTE — Progress Notes (Addendum)
Subjective: Still complaining of shortness of breath  Objective: Vital signs in last 24 hours: Filed Vitals:   08/14/11 1940 08/14/11 2313 08/14/11 2315 08/15/11 0443  BP: 169/63 165/51  165/60  Pulse: 82 80  76  Temp: 97.7 F (36.5 C) 98.1 F (36.7 C)  98.8 F (37.1 C)  TempSrc: Oral Oral  Oral  Resp: 20 20  20   Height:   5\' 4"  (1.626 m)   Weight:   86.274 kg (190 lb 3.2 oz) 86.637 kg (191 lb)  SpO2: 96% 87% 92% 98%    Intake/Output Summary (Last 24 hours) at 08/15/11 0907 Last data filed at 08/15/11 0759  Gross per 24 hour  Intake    170 ml  Output   1950 ml  Net  -1780 ml    Weight change:    . General: in No Acute distress  2. Psychological: Alert and Oriented to self and others.  3. Head/ENT: Moist Mucous Membranes  Head Non traumatic, neck supple  Normal Dentition  4. SKIN: normal Skin turgor, Skin clean Dry and intact no rash  5. Heart: Regular rate and rhythm no Murmur, Rub or gallop  6. Lungs: Clear to auscultation bilaterally, no wheezes patient unable to sit up  7. Abdomen: Soft, non-tender, somewhat distended  8. Lower extremities: no clubbing, cyanosis, or edema  9. Neurologically Grossly intact, moving all 4 extremities equally  10. MSK: Normal range of motion  Lab Results: Results for orders placed during the hospital encounter of 08/14/11 (from the past 24 hour(s))  POCT I-STAT TROPONIN I     Status: Normal   Collection Time   08/14/11  4:14 PM      Component Value Range   Troponin i, poc 0.02  0.00 - 0.08 (ng/mL)   Comment 3           GLUCOSE, CAPILLARY     Status: Abnormal   Collection Time   08/14/11  4:17 PM      Component Value Range   Glucose-Capillary 148 (*) 70 - 99 (mg/dL)  CBC     Status: Normal   Collection Time   08/14/11  4:21 PM      Component Value Range   WBC 9.3  4.0 - 10.5 (K/uL)   RBC 4.04  3.87 - 5.11 (MIL/uL)   Hemoglobin 12.0  12.0 - 15.0 (g/dL)   HCT 45.4  09.8 - 11.9 (%)   MCV 90.3  78.0 - 100.0 (fL)   MCH 29.7   26.0 - 34.0 (pg)   MCHC 32.9  30.0 - 36.0 (g/dL)   RDW 14.7  82.9 - 56.2 (%)   Platelets 172  150 - 400 (K/uL)  DIFFERENTIAL     Status: Normal   Collection Time   08/14/11  4:21 PM      Component Value Range   Neutrophils Relative 77  43 - 77 (%)   Neutro Abs 7.1  1.7 - 7.7 (K/uL)   Lymphocytes Relative 14  12 - 46 (%)   Lymphs Abs 1.3  0.7 - 4.0 (K/uL)   Monocytes Relative 6  3 - 12 (%)   Monocytes Absolute 0.6  0.1 - 1.0 (K/uL)   Eosinophils Relative 3  0 - 5 (%)   Eosinophils Absolute 0.3  0.0 - 0.7 (K/uL)   Basophils Relative 0  0 - 1 (%)   Basophils Absolute 0.0  0.0 - 0.1 (K/uL)  URINALYSIS, ROUTINE W REFLEX MICROSCOPIC     Status: Abnormal  Collection Time   08/14/11  4:40 PM      Component Value Range   Color, Urine YELLOW  YELLOW    APPearance CLOUDY (*) CLEAR    Specific Gravity, Urine 1.013  1.005 - 1.030    pH 6.0  5.0 - 8.0    Glucose, UA NEGATIVE  NEGATIVE (mg/dL)   Hgb urine dipstick NEGATIVE  NEGATIVE    Bilirubin Urine NEGATIVE  NEGATIVE    Ketones, ur NEGATIVE  NEGATIVE (mg/dL)   Protein, ur 30 (*) NEGATIVE (mg/dL)   Urobilinogen, UA 0.2  0.0 - 1.0 (mg/dL)   Nitrite POSITIVE (*) NEGATIVE    Leukocytes, UA MODERATE (*) NEGATIVE   URINE MICROSCOPIC-ADD ON     Status: Abnormal   Collection Time   08/14/11  4:40 PM      Component Value Range   Squamous Epithelial / LPF RARE  RARE    WBC, UA 11-20  <3 (WBC/hpf)   RBC / HPF 0-2  <3 (RBC/hpf)   Bacteria, UA MANY (*) RARE   COMPREHENSIVE METABOLIC PANEL     Status: Abnormal   Collection Time   08/14/11  5:56 PM      Component Value Range   Sodium 120 (*) 135 - 145 (mEq/L)   Potassium 5.2 (*) 3.5 - 5.1 (mEq/L)   Chloride 82 (*) 96 - 112 (mEq/L)   CO2 31  19 - 32 (mEq/L)   Glucose, Bld 128 (*) 70 - 99 (mg/dL)   BUN 14  6 - 23 (mg/dL)   Creatinine, Ser 1.61  0.50 - 1.10 (mg/dL)   Calcium 8.2 (*) 8.4 - 10.5 (mg/dL)   Total Protein 6.1  6.0 - 8.3 (g/dL)   Albumin 3.2 (*) 3.5 - 5.2 (g/dL)   AST 12  0 - 37  (U/L)   ALT 11  0 - 35 (U/L)   Alkaline Phosphatase 136 (*) 39 - 117 (U/L)   Total Bilirubin 0.3  0.3 - 1.2 (mg/dL)   GFR calc non Af Amer 73 (*) >90 (mL/min)   GFR calc Af Amer 84 (*) >90 (mL/min)  PRO B NATRIURETIC PEPTIDE     Status: Abnormal   Collection Time   08/14/11  5:56 PM      Component Value Range   Pro B Natriuretic peptide (BNP) 1067.0 (*) 0 - 450 (pg/mL)  GLUCOSE, CAPILLARY     Status: Abnormal   Collection Time   08/14/11 11:34 PM      Component Value Range   Glucose-Capillary 144 (*) 70 - 99 (mg/dL)  HEMOGLOBIN W9U     Status: Abnormal   Collection Time   08/15/11 12:00 AM      Component Value Range   Hemoglobin A1C 7.1 (*) <5.7 (%)   Mean Plasma Glucose 157 (*) <117 (mg/dL)  CBC     Status: Abnormal   Collection Time   08/15/11 12:00 AM      Component Value Range   WBC 9.3  4.0 - 10.5 (K/uL)   RBC 3.79 (*) 3.87 - 5.11 (MIL/uL)   Hemoglobin 11.3 (*) 12.0 - 15.0 (g/dL)   HCT 04.5 (*) 40.9 - 46.0 (%)   MCV 89.7  78.0 - 100.0 (fL)   MCH 29.8  26.0 - 34.0 (pg)   MCHC 33.2  30.0 - 36.0 (g/dL)   RDW 81.1  91.4 - 78.2 (%)   Platelets 247  150 - 400 (K/uL)  DIFFERENTIAL     Status: Normal   Collection Time  08/15/11 12:00 AM      Component Value Range   Neutrophils Relative 76  43 - 77 (%)   Neutro Abs 7.0  1.7 - 7.7 (K/uL)   Lymphocytes Relative 13  12 - 46 (%)   Lymphs Abs 1.2  0.7 - 4.0 (K/uL)   Monocytes Relative 8  3 - 12 (%)   Monocytes Absolute 0.7  0.1 - 1.0 (K/uL)   Eosinophils Relative 3  0 - 5 (%)   Eosinophils Absolute 0.3  0.0 - 0.7 (K/uL)   Basophils Relative 0  0 - 1 (%)   Basophils Absolute 0.0  0.0 - 0.1 (K/uL)  CARDIAC PANEL(CRET KIN+CKTOT+MB+TROPI)     Status: Normal   Collection Time   08/15/11 12:00 AM      Component Value Range   Total CK 34  7 - 177 (U/L)   CK, MB 3.2  0.3 - 4.0 (ng/mL)   Troponin I <0.30  <0.30 (ng/mL)   Relative Index RELATIVE INDEX IS INVALID  0.0 - 2.5   BASIC METABOLIC PANEL     Status: Abnormal   Collection  Time   08/15/11 12:00 AM      Component Value Range   Sodium 124 (*) 135 - 145 (mEq/L)   Potassium 4.3  3.5 - 5.1 (mEq/L)   Chloride 83 (*) 96 - 112 (mEq/L)   CO2 32  19 - 32 (mEq/L)   Glucose, Bld 177 (*) 70 - 99 (mg/dL)   BUN 12  6 - 23 (mg/dL)   Creatinine, Ser 1.19  0.50 - 1.10 (mg/dL)   Calcium 8.6  8.4 - 14.7 (mg/dL)   GFR calc non Af Amer 73 (*) >90 (mL/min)   GFR calc Af Amer 84 (*) >90 (mL/min)  OSMOLALITY, URINE     Status: Abnormal   Collection Time   08/15/11 12:14 AM      Component Value Range   Osmolality, Ur 275 (*) 390 - 1090 (mOsm/kg)  SODIUM, URINE, RANDOM     Status: Normal   Collection Time   08/15/11 12:14 AM      Component Value Range   Sodium, Ur 92    MRSA PCR SCREENING     Status: Normal   Collection Time   08/15/11 12:15 AM      Component Value Range   MRSA by PCR NEGATIVE  NEGATIVE   CARDIAC PANEL(CRET KIN+CKTOT+MB+TROPI)     Status: Normal   Collection Time   08/15/11  5:15 AM      Component Value Range   Total CK 36  7 - 177 (U/L)   CK, MB 3.0  0.3 - 4.0 (ng/mL)   Troponin I <0.30  <0.30 (ng/mL)   Relative Index RELATIVE INDEX IS INVALID  0.0 - 2.5   BASIC METABOLIC PANEL     Status: Abnormal   Collection Time   08/15/11  5:15 AM      Component Value Range   Sodium 120 (*) 135 - 145 (mEq/L)   Potassium 4.9  3.5 - 5.1 (mEq/L)   Chloride 81 (*) 96 - 112 (mEq/L)   CO2 30  19 - 32 (mEq/L)   Glucose, Bld 217 (*) 70 - 99 (mg/dL)   BUN 13  6 - 23 (mg/dL)   Creatinine, Ser 8.29  0.50 - 1.10 (mg/dL)   Calcium 8.3 (*) 8.4 - 10.5 (mg/dL)   GFR calc non Af Amer 74 (*) >90 (mL/min)   GFR calc Af Amer 86 (*) >90 (mL/min)  GLUCOSE, CAPILLARY     Status: Abnormal   Collection Time   08/15/11  7:34 AM      Component Value Range   Glucose-Capillary 233 (*) 70 - 99 (mg/dL)     Micro: Recent Results (from the past 240 hour(s))  MRSA PCR SCREENING     Status: Normal   Collection Time   08/15/11 12:15 AM      Component Value Range Status Comment   MRSA  by PCR NEGATIVE  NEGATIVE  Final     Studies/Results: Dg Abd 1 View  08/15/2011  *RADIOLOGY REPORT*  Clinical Data: Abdominal pain and distention.  ABDOMEN - 1 VIEW  Comparison: Two-view abdomen x-ray 06/27/2011.  Findings: Bowel gas pattern unremarkable without evidence of obstruction or significant ileus.  Very large amount of stool throughout the colon from cecum to rectum.  Calcified fibroid in the midline of the pelvis.  Degenerative changes involving the lumbar spine and both hips.  IMPRESSION: No acute abdominal abnormality.  Large stool burden.  Original Report Authenticated By: Arnell Sieving, M.D.   Dg Chest Port 1 View  08/14/2011  *RADIOLOGY REPORT*  Clinical Data: Shortness of breath, confusion and wheezing.  The  PORTABLE CHEST - 1 VIEW  Comparison: 06/26/2011  Findings: There likely is a chronic component of mild interstitial edema and chronic lung disease.  There is no evidence of overt airspace edema or pleural fluid.  Heart size is stable.  IMPRESSION: Chronic lung disease and probable chronic component of interstitial edema.  Original Report Authenticated By: Reola Calkins, M.D.    Medications:  Scheduled Meds:   . albuterol  5 mg Nebulization Once  . aspirin  81 mg Oral Daily  . azithromycin  500 mg Oral Daily   Followed by  . azithromycin  250 mg Oral Daily  . beta carotene w/minerals  1 tablet Oral Daily  . cefTRIAXone (ROCEPHIN)  IV  1 g Intravenous Once  . cefTRIAXone (ROCEPHIN)  IV  1 g Intravenous Q24H  . DULoxetine  30 mg Oral QHS  . enoxaparin  40 mg Subcutaneous QHS  . furosemide  40 mg Intravenous Daily  . furosemide  60 mg Intravenous Once  . guaiFENesin  600 mg Oral BID  . hydrALAZINE  25 mg Oral Q6H  . insulin aspart  0-15 Units Subcutaneous TID WC  . insulin aspart  0-5 Units Subcutaneous QHS  . insulin glargine  58 Units Subcutaneous QHS  . ipratropium  0.5 mg Nebulization Once  . levothyroxine  100 mcg Oral QAC breakfast  . metoprolol  50  mg Oral BID  . pantoprazole  40 mg Oral Q1200  . polyethylene glycol  17 g Oral Daily  . predniSONE  60 mg Oral Q breakfast  . predniSONE  60 mg Oral NOW  . simvastatin  20 mg Oral Daily  . sodium chloride  3 mL Intravenous Q12H  . DISCONTD: predniSONE  60 mg Oral AC breakfast   Continuous Infusions:  PRN Meds:.sodium chloride, acetaminophen, albuterol, ALPRAZolam, ipratropium, ondansetron (ZOFRAN) IV, sodium chloride, DISCONTD: acetaminophen  Assessment: Principal Problem:  *CHF exacerbation Active Problems:  SOB (shortness of breath)  Hypertension  Hypothyroidism  CAD (coronary artery disease)  Confusion  Hyponatremia  COPD bronchitis  UTI (lower urinary tract infection)  Abdominal distention  Hyperkalemia   Plan: #1 confusion secondary to UTI/hyponatremia #2 shortness of breath patient has COPD, also has a component of chronic diastolic heart failure with exacerbation. Will hold Lasix for now given  hyponatremia and restrict fluids, start  tolvaptan as patient is also volume overloaded  #3 CHF exacerbation, repeat echo is pending, will try tolvaptan, hold Lasix #4 COPD continue nebs, empiric antibiotics #5 Hypertension holding Cozaar due to hyperkalemia #6 hypothyroidism continue Levoxyl #7 coronary artery disease continue to cycle enzymes #8 Diabetes continue Lantus sliding scale insulin holding metformin DNR/DNI     LOS: 1 day   Dimitrius Steedman 08/15/2011, 9:07 AM

## 2011-08-15 NOTE — Care Management Note (Signed)
    Page 1 of 1   08/21/2011     2:27:36 PM   CARE MANAGEMENT NOTE 08/21/2011  Patient:  [name], [name]   Account Number:  [id number]  Date Initiated:  08/15/2011  Documentation initiated by:  Lanier Clam  Subjective/Objective Assessment:   ADMITTED W/CONFUSION.ZO:[id number],JXB     Action/Plan:   FROM SNF-[facility]   Anticipated DC Date:  08/21/2011   Anticipated DC Plan:  SKILLED NURSING FACILITY  In-house referral  Clinical Social Worker      DC Planning Services  CM consult      Choice offered to / List presented to:             Status of service:  Completed, signed off Medicare Important Message given?   (If response is "NO", the following Medicare IM given date fields will be blank) Date Medicare IM given:   Date Additional Medicare IM given:    Discharge Disposition:  SKILLED NURSING FACILITY  Per UR Regulation:  Reviewed for med. necessity/level of care/duration of stay  If discussed at Long Length of Stay Meetings, dates discussed:   08/20/2011    Comments:  08/19/11 Sekou Zuckerman RN,BSN NCM [phone] CONTINUE TO MONITOR FOR PROGRESS.LASIX IV.D/C PLAN RETURN SNF.  08/15/11 Keyani Rigdon RN,BSN NCM [phone] NOTED-OT EVAL-NOT AMBULATORY,TOTAL ASST,FROM SNF.

## 2011-08-15 NOTE — Progress Notes (Signed)
  Echocardiogram 2D Echocardiogram has been performed.  Cathie Beams Deneen 08/15/2011, 3:51 PM

## 2011-08-15 NOTE — Clinical Social Work Psychosocial (Unsigned)
     Clinical Social Work Department BRIEF PSYCHOSOCIAL ASSESSMENT 08/15/2011  Patient:  [name], [name]     Account Number:  [id number]     Admit date:  08/14/2011  Clinical Social Worker:  Hattie Perch  Date/Time:  08/15/2011 12:00 M  Referred by:  Physician  Date Referred:  08/15/2011 Referred for  SNF Placement   Other Referral:   Interview type:  Family Other interview type:    PSYCHOSOCIAL DATA Living Status:  FACILITY Admitted from facility:  [facility] Level of care:  Skilled Nursing Facility Primary support name:  [name] Primary support relationship to patient:  CHILD, ADULT Degree of support available:   good    CURRENT CONCERNS Current Concerns  Post-Acute Placement   Other Concerns:    SOCIAL WORK ASSESSMENT / PLAN Patient is not currently oriented. patient is from [facility]. Patient is a resident there and has lived there long term for 5 years.   Assessment/plan status:   Other assessment/ plan:   Information/referral to community resources:    PATIENTS/FAMILYS RESPONSE TO PLAN OF CARE: Patient's family is agreeable to patient returning to [facility] upon discharge.

## 2011-08-15 NOTE — Progress Notes (Signed)
OT Note:  Order received, chart reviewed, telephone conversation with Britta Mccreedy at [facility].  Pt requires 2 person assist for transfers, is not ambulatory, and requires total assist for ADL including feeding.  This information was shared with RN Irving Burton).  Did not proceed with eval.  Signing off. 08/15/2011 Martie Round, OTR/L Pager: [phone]

## 2011-08-15 NOTE — Progress Notes (Signed)
FL2 placed on chart for physician signature. CSW to follow for return to [facility].   Bernita Beckstrom C. Ferlin Fairhurst MSW, LCSW [phone]

## 2011-08-15 NOTE — Progress Notes (Addendum)
INITIAL ADULT NUTRITION ASSESSMENT Date: 08/15/2011   Time: 1:04 PM Reason for Assessment: Nutrition Risk for dysphagia   ASSESSMENT: Female 78 y.o.  Dx: CHF exacerbation  Hx:  Past Medical History  Diagnosis Date  . Dysphagia   . Diabetes mellitus   . Hypertension   . Hypothyroidism   . Hyperlipemia   . GERD (gastroesophageal reflux disease)   . Depression   . Anxiety   . Acute MI   . Pneumonia   . Stroke   . UTI (urinary tract infection)   . COPD (chronic obstructive pulmonary disease)   . CAD (coronary artery disease)   . Late effects of CVA (cerebrovascular accident)   . DEMENTIA     Related Meds:  Scheduled Meds:   . albuterol  5 mg Nebulization Once  . aspirin  81 mg Oral Daily  . azithromycin  500 mg Intravenous Q24H  . azithromycin  500 mg Oral Daily  . beta carotene w/minerals  1 tablet Oral Daily  . cefTRIAXone (ROCEPHIN)  IV  1 g Intravenous Once  . cefTRIAXone (ROCEPHIN)  IV  1 g Intravenous Q24H  . enoxaparin  40 mg Subcutaneous QHS  . furosemide  60 mg Intravenous Once  . guaiFENesin  600 mg Oral BID  . hydrALAZINE  25 mg Oral Q6H  . insulin aspart  0-15 Units Subcutaneous TID WC  . insulin aspart  0-5 Units Subcutaneous QHS  . insulin glargine  40 Units Subcutaneous QHS  . ipratropium  0.5 mg Nebulization Once  . lactulose  20 g Oral BID  . levothyroxine  100 mcg Oral QAC breakfast  . metoprolol  50 mg Oral BID  . pantoprazole  40 mg Oral Q1200  . polyethylene glycol  17 g Oral Daily  . predniSONE  60 mg Oral Q breakfast  . predniSONE  60 mg Oral NOW  . simvastatin  20 mg Oral Daily  . sodium chloride  3 mL Intravenous Q12H  . tolvaptan  15 mg Oral Q24H  . DISCONTD: azithromycin  250 mg Oral Daily  . DISCONTD: DULoxetine  30 mg Oral QHS  . DISCONTD: furosemide  40 mg Intravenous Daily  . DISCONTD: insulin glargine  58 Units Subcutaneous QHS  . DISCONTD: predniSONE  60 mg Oral AC breakfast   Continuous Infusions:  PRN Meds:.sodium  chloride, acetaminophen, albuterol, ALPRAZolam, bisacodyl, ipratropium, ondansetron (ZOFRAN) IV, sodium chloride, DISCONTD: acetaminophen   Ht: 5\' 4"  (162.6 cm)  Wt: 191 lb (86.637 kg)  Ideal Wt: 54.54 kg % Ideal Wt: 159.1%  Wt Readings from Last 10 Encounters:  08/15/11 191 lb (86.637 kg)  06/22/11 189 lb (85.73 kg)   Body mass index is 32.79 kg/(m^2). (Obesity Class 1)   Food/Nutrition Related Hx: Patient resides in a nursing home facility. Per family member patient receives a pureed diet. Family member reported patient unable to eat most of lunch meal because she is without teeth and was unable to eat the meat. Poor PO intake documented 50% at meals. Family member reported patient drinks Ensure nutrition supplement at her residence.   Labs:  CMP     Component Value Date/Time   NA 120* 08/15/2011 1105   K 4.4 08/15/2011 1105   CL 81* 08/15/2011 1105   CO2 32 08/15/2011 1105   GLUCOSE 266* 08/15/2011 1105   BUN 13 08/15/2011 1105   CREATININE 0.76 08/15/2011 1105   CALCIUM 8.4 08/15/2011 1105   PROT 6.1 08/14/2011 1756   ALBUMIN 3.2* 08/14/2011 1756  AST 12 08/14/2011 1756   ALT 11 08/14/2011 1756   ALKPHOS 136* 08/14/2011 1756   BILITOT 0.3 08/14/2011 1756   GFRNONAA 72* 08/15/2011 1105   GFRAA 84* 08/15/2011 1105    Intake/Output Summary (Last 24 hours) at 08/15/11 1313 Last data filed at 08/15/11 0759  Gross per 24 hour  Intake    170 ml  Output   1950 ml  Net  -1780 ml     Diet Order: Cardiac  Supplements/Tube Feeding: none at this time.   IVF:    Estimated Nutritional Needs:   Kcal: 1610-9604 Protein: 95.5-112.8 grams Fluid: 1200 ml fluid restriction per MD request  NUTRITION DIAGNOSIS: -Inadequate oral intake (NI-2.1).  Status: Ongoing  RELATED TO: difficulty chewing  AS EVIDENCE BY: patient edentulous and PO intake of current diet documented 50% at meals.   MONITORING/EVALUATION(Goals): Weight trends, Chewing ability, PO intake, labs 1. Patient intake to  increase with down grade of diet to dysphagia 2. 2. PO intake > 75% at meals.   EDUCATION NEEDS: -No education needs identified at this time  INTERVENTION: 1. Will down grade diet to dys 2 diet, carb modified.  2. Will order patient one Ensure nutrition supplement daily.  3. RD to follow for nutrition plan of care.   Dietitian [phone]  DOCUMENTATION CODES Per approved criteria  -Obesity Unspecified    Iven Finn [facility] 08/15/2011, 1:04 PM

## 2011-08-15 NOTE — Progress Notes (Signed)
Inpatient Diabetes Program Recommendations  AACE/ADA: New Consensus Statement on Inpatient Glycemic Control (2009)  Target Ranges:  Prepandial:   less than 140 mg/dL      Peak postprandial:   less than 180 mg/dL (1-2 hours)      Critically ill patients:  140 - 180 mg/dL   Reason for Visit: Hyperglycemia  78 y.o. female  has a past medical history of Dysphagia; Diabetes mellitus; Hypertension; Hypothyroidism; Hyperlipemia; GERD (gastroesophageal reflux disease); Depression; Anxiety; Acute MI; Pneumonia; Stroke; UTI (urinary tract infection); COPD (chronic obstructive pulmonary disease); CAD (coronary artery disease); Late effects of CVA (cerebrovascular accident); and DEMENTIA. Somewhat more lethargic than usual. She does have occasional episodes of hypoglycemia  Results for KARLIE, AUNG (MRN [id number]) as of 08/15/2011 16:17  Ref. Range 08/14/2011 16:17 08/14/2011 23:34 08/15/2011 07:34 08/15/2011 11:31  Glucose-Capillary Latest Range: 70-99 mg/dL 469 (H) 629 (H) 528 (H) 274 (H)  Results for SAMANI, DEAL (MRN [id number]) as of 08/15/2011 16:17  Ref. Range 08/15/2011 00:00  Hemoglobin A1C Latest Range: <5.7 % 7.1 (H)   Results for SARRINAH, GARDIN (MRN [id number]) as of 08/15/2011 16:17  Ref. Range 08/15/2011 05:15 08/15/2011 11:05  Glucose Latest Range: 70-99 mg/dL 034 (H) 742 (H)         Note: Recommend Novolog 4 units tidwc for meal coverage insulin.  Will follow.

## 2011-08-16 DIAGNOSIS — I509 Heart failure, unspecified: Secondary | ICD-10-CM

## 2011-08-16 DIAGNOSIS — E782 Mixed hyperlipidemia: Secondary | ICD-10-CM

## 2011-08-16 DIAGNOSIS — R197 Diarrhea, unspecified: Secondary | ICD-10-CM

## 2011-08-16 DIAGNOSIS — J441 Chronic obstructive pulmonary disease with (acute) exacerbation: Secondary | ICD-10-CM

## 2011-08-16 LAB — BASIC METABOLIC PANEL
BUN: 14 mg/dL (ref 6–23)
CO2: 33 mEq/L — ABNORMAL HIGH (ref 19–32)
Chloride: 84 mEq/L — ABNORMAL LOW (ref 96–112)
Chloride: 87 mEq/L — ABNORMAL LOW (ref 96–112)
Creatinine, Ser: 0.79 mg/dL (ref 0.50–1.10)
GFR calc Af Amer: 69 mL/min — ABNORMAL LOW (ref >90)
Glucose, Bld: 280 mg/dL — ABNORMAL HIGH (ref 70–99)
Potassium: 4.2 mEq/L (ref 3.5–5.1)

## 2011-08-16 LAB — URINE CULTURE: Colony Count: >=100000

## 2011-08-16 LAB — GLUCOSE, CAPILLARY
Glucose-Capillary: 223 mg/dL — ABNORMAL HIGH (ref 70–99)
Glucose-Capillary: 215 mg/dL — ABNORMAL HIGH (ref 70–99)

## 2011-08-16 MED ORDER — DILTIAZEM HCL 25 MG/5ML IV SOLN
10.0000 mg | Freq: Once | INTRAVENOUS | Status: AC
Start: 2011-08-16 — End: 2011-08-16
  Administered 2011-08-16: 10 mg via INTRAVENOUS
  Filled 2011-08-16: qty 5

## 2011-08-16 NOTE — Progress Notes (Addendum)
Subjective: Converted to atrial fibrillation overnight Rate control Hemodynamically stable Denies any chest pain shortness of breath  Objective: Vital signs in last 24 hours: Filed Vitals:   08/15/11 2356 08/16/11 0450 08/16/11 0656 08/16/11 0935  BP: 168/53 182/69  163/81  Pulse: 75 74  80  Temp:  97.5 F (36.4 C)    TempSrc:  Oral    Resp: 20 18    Height:      Weight:   82.8 kg (182 lb 8.7 oz)   SpO2:  100%      Intake/Output Summary (Last 24 hours) at 08/16/11 1053 Last data filed at 08/16/11 0900  Gross per 24 hour  Intake    840 ml  Output   2300 ml  Net  -1460 ml    Weight change: -3.384 kg (-7 lb 7.4 oz)  General: in No Acute distress  2. Psychological: Alert and Oriented to self and others.  3. Head/ENT: Moist Mucous Membranes  Head Non traumatic, neck supple  Normal Dentition  4. SKIN: normal Skin turgor, Skin clean Dry and intact no rash  5. Heart: Regular rate and rhythm no Murmur, Rub or gallop  6. Lungs: Clear to auscultation bilaterally, no wheezes patient unable to sit up  7. Abdomen: Soft, non-tender, somewhat distended  8. Lower extremities: no clubbing, cyanosis, or edema  9. Neurologically Grossly intact, moving all 4 extremities equally  10. MSK: Normal range of motion   Lab Results: Results for orders placed during the hospital encounter of 08/14/11 (from the past 24 hour(s))  CARDIAC PANEL(CRET KIN+CKTOT+MB+TROPI)     Status: Normal   Collection Time   08/15/11 11:05 AM      Component Value Range   Total CK 39  7 - 177 (U/L)   CK, MB 2.8  0.3 - 4.0 (ng/mL)   Troponin I <0.30  <0.30 (ng/mL)   Relative Index RELATIVE INDEX IS INVALID  0.0 - 2.5   OSMOLALITY     Status: Abnormal   Collection Time   08/15/11 11:05 AM      Component Value Range   Osmolality 269 (*) 275 - 300 (mOsm/kg)  BASIC METABOLIC PANEL     Status: Abnormal   Collection Time   08/15/11 11:05 AM      Component Value Range   Sodium 120 (*) 135 - 145 (mEq/L)   Potassium  4.4  3.5 - 5.1 (mEq/L)   Chloride 81 (*) 96 - 112 (mEq/L)   CO2 32  19 - 32 (mEq/L)   Glucose, Bld 266 (*) 70 - 99 (mg/dL)   BUN 13  6 - 23 (mg/dL)   Creatinine, Ser 1.61  0.50 - 1.10 (mg/dL)   Calcium 8.4  8.4 - 09.6 (mg/dL)   GFR calc non Af Amer 72 (*) >90 (mL/min)   GFR calc Af Amer 84 (*) >90 (mL/min)  GLUCOSE, CAPILLARY     Status: Abnormal   Collection Time   08/15/11 11:31 AM      Component Value Range   Glucose-Capillary 274 (*) 70 - 99 (mg/dL)  GLUCOSE, CAPILLARY     Status: Abnormal   Collection Time   08/15/11  4:33 PM      Component Value Range   Glucose-Capillary 234 (*) 70 - 99 (mg/dL)  BASIC METABOLIC PANEL     Status: Abnormal   Collection Time   08/15/11  5:29 PM      Component Value Range   Sodium 124 (*) 135 - 145 (mEq/L)  Potassium 4.5  3.5 - 5.1 (mEq/L)   Chloride 84 (*) 96 - 112 (mEq/L)   CO2 32  19 - 32 (mEq/L)   Glucose, Bld 272 (*) 70 - 99 (mg/dL)   BUN 13  6 - 23 (mg/dL)   Creatinine, Ser 5.28  0.50 - 1.10 (mg/dL)   Calcium 8.4  8.4 - 41.3 (mg/dL)   GFR calc non Af Amer 71 (*) >90 (mL/min)   GFR calc Af Amer 83 (*) >90 (mL/min)  GLUCOSE, CAPILLARY     Status: Abnormal   Collection Time   08/15/11  9:44 PM      Component Value Range   Glucose-Capillary 275 (*) 70 - 99 (mg/dL)  BASIC METABOLIC PANEL     Status: Abnormal   Collection Time   08/16/11  5:32 AM      Component Value Range   Sodium 127 (*) 135 - 145 (mEq/L)   Potassium 4.2  3.5 - 5.1 (mEq/L)   Chloride 87 (*) 96 - 112 (mEq/L)   CO2 36 (*) 19 - 32 (mEq/L)   Glucose, Bld 172 (*) 70 - 99 (mg/dL)   BUN 14  6 - 23 (mg/dL)   Creatinine, Ser 2.44  0.50 - 1.10 (mg/dL)   Calcium 8.6  8.4 - 01.0 (mg/dL)   GFR calc non Af Amer 59 (*) >90 (mL/min)   GFR calc Af Amer 69 (*) >90 (mL/min)  GLUCOSE, CAPILLARY     Status: Abnormal   Collection Time   08/16/11  7:37 AM      Component Value Range   Glucose-Capillary 153 (*) 70 - 99 (mg/dL)     Micro: Recent Results (from the past 240 hour(s))    URINE CULTURE     Status: Normal (Preliminary result)   Collection Time   08/14/11  4:40 PM      Component Value Range Status Comment   Specimen Description URINE, CATHETERIZED   Final    Special Requests NONE   Final    Culture  Setup Time [id number]   Final    Colony Count >=100,000 COLONIES/ML   Final    Culture ESCHERICHIA COLI   Final    Report Status PENDING   Incomplete   MRSA PCR SCREENING     Status: Normal   Collection Time   08/15/11 12:15 AM      Component Value Range Status Comment   MRSA by PCR NEGATIVE  NEGATIVE  Final     Studies/Results: Dg Abd 1 View  08/15/2011  *RADIOLOGY REPORT*  Clinical Data: Abdominal pain and distention.  ABDOMEN - 1 VIEW  Comparison: Two-view abdomen x-ray 06/27/2011.  Findings: Bowel gas pattern unremarkable without evidence of obstruction or significant ileus.  Very large amount of stool throughout the colon from cecum to rectum.  Calcified fibroid in the midline of the pelvis.  Degenerative changes involving the lumbar spine and both hips.  IMPRESSION: No acute abdominal abnormality.  Large stool burden.  Original Report Authenticated By: Arnell Sieving, M.D.   Ct Head Wo Contrast  08/15/2011  *RADIOLOGY REPORT*  Clinical Data: Increasing confusion. Altered mental status.  Prior stroke.  CT HEAD WITHOUT CONTRAST  Technique:  Contiguous axial images were obtained from the base of the skull through the vertex without contrast.  Comparison: CT scan dated 02/02/2011  Findings: There is no acute intracranial hemorrhage, infarction, or mass lesion.  There is an old left basal ganglia infarct extending into the left centrum semiovale.  Diffuse mild cerebral  cortical and cerebellar atrophy.  No osseous abnormality.  IMPRESSION: No acute abnormalities.  Old left brain stroke.  Original Report Authenticated By: Gwynn Burly, M.D.   Dg Chest Port 1 View  08/14/2011  *RADIOLOGY REPORT*  Clinical Data: Shortness of breath, confusion and wheezing.   The  PORTABLE CHEST - 1 VIEW  Comparison: 06/26/2011  Findings: There likely is a chronic component of mild interstitial edema and chronic lung disease.  There is no evidence of overt airspace edema or pleural fluid.  Heart size is stable.  IMPRESSION: Chronic lung disease and probable chronic component of interstitial edema.  Original Report Authenticated By: Reola Calkins, M.D.    Medications:  Scheduled Meds:   . aspirin  81 mg Oral Daily  . azithromycin  500 mg Intravenous Q24H  . beta carotene w/minerals  1 tablet Oral Daily  . cefTRIAXone (ROCEPHIN)  IV  1 g Intravenous Q24H  . diltiazem  10 mg Intravenous Once  . enoxaparin  40 mg Subcutaneous QHS  . feeding supplement  237 mL Oral QAC supper  . guaiFENesin  600 mg Oral BID  . hydrALAZINE  25 mg Oral Q6H  . insulin aspart  0-15 Units Subcutaneous TID WC  . insulin aspart  0-5 Units Subcutaneous QHS  . insulin glargine  40 Units Subcutaneous QHS  . lactulose  20 g Oral BID  . levothyroxine  100 mcg Oral QAC breakfast  . metoprolol  50 mg Oral BID  . pantoprazole  40 mg Oral Q1200  . polyethylene glycol  17 g Oral Daily  . predniSONE  60 mg Oral Q breakfast  . simvastatin  20 mg Oral Daily  . sodium chloride  3 mL Intravenous Q12H  . tolvaptan  15 mg Oral Q24H   Continuous Infusions:  PRN Meds:.sodium chloride, acetaminophen, albuterol, ALPRAZolam, bisacodyl, ipratropium, ondansetron (ZOFRAN) IV, sodium chloride   Assessment: Principal Problem:  *CHF exacerbation Active Problems:  SOB (shortness of breath)  Hypertension  Hypothyroidism  CAD (coronary artery disease)  Confusion  Hyponatremia  COPD bronchitis  UTI (lower urinary tract infection)  Abdominal distention  Hyperkalemia   Plan:  #1 confusion secondary to UTI/hyponatremia, hypernatremia is improving with tolvapton #2 shortness of breath patient has COPD, also has a component of chronic diastolic heart failure with exacerbation. Will hold Lasix for  now given hyponatremia and restrict fluids, start tolvaptan as patient is also volume overloaded  #3 CHF exacerbation, repeat echo is pending, most likely has diastolic heart failure, will try tolvaptan, hold Lasix  #4 COPD, acute bronchitis continue nebs, empiric antibiotics  #5 Hypertension holding Cozaar due to hyperkalemia  #6 hypothyroidism continue Levoxyl  #7 coronary artery disease continue to cycle enzymes  #8 Diabetes continue Lantus sliding scale insulin holding metformin  #9 atrial fibrillation will administer one dose of IV diltiazem, continue metoprolol, no anticoagulation at this time according to the family, after discussion of the potential side effects, versus benefit  DNR/DNI Anticipate discharge on Monday   Discussed care with Criss Alvine  Daughter  [phone]  [phone]  [phone]        LOS: 2 days   [name] 08/16/2011, 10:53 AM

## 2011-08-16 NOTE — Progress Notes (Signed)
Pts iv is infiltrated and painful. IV antibx stopped, and I tried to locate another site to restart, but could not find a location. Called iv team to restart iv.

## 2011-08-16 NOTE — Progress Notes (Signed)
Pt converted back to sinus rhythm, pulse 96.

## 2011-08-17 LAB — BASIC METABOLIC PANEL
CO2: 39 mEq/L — ABNORMAL HIGH (ref 19–32)
Calcium: 8.4 mg/dL (ref 8.4–10.5)
Calcium: 8.3 mg/dL — ABNORMAL LOW (ref 8.4–10.5)
Creatinine, Ser: 0.83 mg/dL (ref 0.50–1.10)
Creatinine, Ser: 0.85 mg/dL (ref 0.50–1.10)
GFR calc Af Amer: 70 mL/min — ABNORMAL LOW (ref >90)
GFR calc non Af Amer: 60 mL/min — ABNORMAL LOW (ref >90)
Glucose, Bld: 222 mg/dL — ABNORMAL HIGH (ref 70–99)
Sodium: 131 mEq/L — ABNORMAL LOW (ref 135–145)

## 2011-08-17 MED ORDER — DILTIAZEM HCL ER COATED BEADS 180 MG PO CP24
180.0000 mg | ORAL_CAPSULE | Freq: Every day | ORAL | Status: DC
  Administered 2011-08-17 – 2011-08-21 (×5): 180 mg via ORAL
  Filled 2011-08-17 (×5): qty 1

## 2011-08-17 MED ORDER — FUROSEMIDE 10 MG/ML IJ SOLN
20.0000 mg | Freq: Two times a day (BID) | INTRAMUSCULAR | Status: DC
  Administered 2011-08-17 – 2011-08-18 (×4): 20 mg via INTRAVENOUS
  Filled 2011-08-17 (×4): qty 2

## 2011-08-17 MED ORDER — SODIUM CHLORIDE 0.9 % IV SOLN
500.0000 mg | Freq: Three times a day (TID) | INTRAVENOUS | Status: DC
  Administered 2011-08-17 – 2011-08-21 (×13): 500 mg via INTRAVENOUS
  Filled 2011-08-17 (×15): qty 500

## 2011-08-17 MED ORDER — ATORVASTATIN CALCIUM 10 MG PO TABS
10.0000 mg | ORAL_TABLET | Freq: Every day | ORAL | Status: DC
  Administered 2011-08-17 – 2011-08-20 (×4): 10 mg via ORAL
  Filled 2011-08-17 (×5): qty 1

## 2011-08-17 MED ORDER — BISACODYL 10 MG RE SUPP
10.0000 mg | Freq: Once | RECTAL | Status: AC
  Administered 2011-08-17: 10 mg via RECTAL
  Filled 2011-08-17: qty 1

## 2011-08-17 NOTE — Progress Notes (Addendum)
Subjective: Pt converted back to sinus rhythm, pulse 96 yesterday evening IV infiltrated last night Patient denies any chest pain or shortness of breath   Objective: Vital signs in last 24 hours: Filed Vitals:   08/16/11 1446 08/16/11 2150 08/17/11 0550 08/17/11 0700  BP: 167/81 167/61 171/64   Pulse: 86 79 68   Temp: 98.3 F (36.8 C) 98 F (36.7 C) 97.5 F (36.4 C)   TempSrc: Oral Oral Oral   Resp: 20 20 20    Height:      Weight:    84.505 kg (186 lb 4.8 oz)  SpO2: 99% 98% 98%     Intake/Output Summary (Last 24 hours) at 08/17/11 1010 Last data filed at 08/17/11 0829  Gross per 24 hour  Intake    680 ml  Output    950 ml  Net   -270 ml    Weight change: 1.705 kg (3 lb 12.2 oz)  . General: in No Acute distress  2. Psychological: Alert and Oriented to self and others.  3. Head/ENT: Moist Mucous Membranes  Head Non traumatic, neck supple  Normal Dentition  4. SKIN: normal Skin turgor, Skin clean Dry and intact no rash  5. Heart: Regular rate and rhythm no Murmur, Rub or gallop  6. Lungs: Clear to auscultation bilaterally, no wheezes patient unable to sit up  7. Abdomen: Soft, non-tender, somewhat distended  8. Lower extremities: no clubbing, cyanosis, or edema  9. Neurologically Grossly intact, moving all 4 extremities equally  10. MSK: Normal range of motion   Lab Results: Results for orders placed during the hospital encounter of 08/14/11 (from the past 24 hour(s))  GLUCOSE, CAPILLARY     Status: Abnormal   Collection Time   08/16/11 11:54 AM      Component Value Range   Glucose-Capillary 223 (*) 70 - 99 (mg/dL)  GLUCOSE, CAPILLARY     Status: Abnormal   Collection Time   08/16/11  4:58 PM      Component Value Range   Glucose-Capillary 215 (*) 70 - 99 (mg/dL)  GLUCOSE, CAPILLARY     Status: Abnormal   Collection Time   08/16/11  9:55 PM      Component Value Range   Glucose-Capillary 254 (*) 70 - 99 (mg/dL)   Comment 1 Notify RN    BASIC METABOLIC PANEL      Status: Abnormal   Collection Time   08/17/11  5:25 AM      Component Value Range   Sodium 128 (*) 135 - 145 (mEq/L)   Potassium 4.7  3.5 - 5.1 (mEq/L)   Chloride 89 (*) 96 - 112 (mEq/L)   CO2 33 (*) 19 - 32 (mEq/L)   Glucose, Bld 155 (*) 70 - 99 (mg/dL)   BUN 17  6 - 23 (mg/dL)   Creatinine, Ser 4.13  0.50 - 1.10 (mg/dL)   Calcium 8.4  8.4 - 24.4 (mg/dL)   GFR calc non Af Amer 60 (*) >90 (mL/min)   GFR calc Af Amer 70 (*) >90 (mL/min)  GLUCOSE, CAPILLARY     Status: Abnormal   Collection Time   08/17/11  7:45 AM      Component Value Range   Glucose-Capillary 162 (*) 70 - 99 (mg/dL)     Micro: Recent Results (from the past 240 hour(s))  URINE CULTURE     Status: Normal   Collection Time   08/14/11  4:40 PM      Component Value Range Status Comment  Specimen Description URINE, CATHETERIZED   Final    Special Requests NONE   Final    Culture  Setup Time [id number]   Final    Colony Count >=100,000 COLONIES/ML   Final    Culture     Final    Value: ESCHERICHIA COLI     Note: Confirmed Extended Spectrum Beta-Lactamase Producer (ESBL)   Report Status 08/16/2011 FINAL   Final    Organism ID, Bacteria ESCHERICHIA COLI   Final   MRSA PCR SCREENING     Status: Normal   Collection Time   08/15/11 12:15 AM      Component Value Range Status Comment   MRSA by PCR NEGATIVE  NEGATIVE  Final     Studies/Results: Ct Head Wo Contrast  08/15/2011  *RADIOLOGY REPORT*  Clinical Data: Increasing confusion. Altered mental status.  Prior stroke.  CT HEAD WITHOUT CONTRAST  Technique:  Contiguous axial images were obtained from the base of the skull through the vertex without contrast.  Comparison: CT scan dated 02/02/2011  Findings: There is no acute intracranial hemorrhage, infarction, or mass lesion.  There is an old left basal ganglia infarct extending into the left centrum semiovale.  Diffuse mild cerebral cortical and cerebellar atrophy.  No osseous abnormality.  IMPRESSION: No acute  abnormalities.  Old left brain stroke.  Original Report Authenticated By: Gwynn Burly, M.D.    Medications:  Scheduled Meds:   . aspirin  81 mg Oral Daily  . azithromycin  500 mg Intravenous Q24H  . beta carotene w/minerals  1 tablet Oral Daily  . cefTRIAXone (ROCEPHIN)  IV  1 g Intravenous Q24H  . diltiazem  10 mg Intravenous Once  . enoxaparin  40 mg Subcutaneous QHS  . feeding supplement  237 mL Oral QAC supper  . guaiFENesin  600 mg Oral BID  . hydrALAZINE  25 mg Oral Q6H  . insulin aspart  0-15 Units Subcutaneous TID WC  . insulin aspart  0-5 Units Subcutaneous QHS  . insulin glargine  40 Units Subcutaneous QHS  . lactulose  20 g Oral BID  . levothyroxine  100 mcg Oral QAC breakfast  . metoprolol  50 mg Oral BID  . pantoprazole  40 mg Oral Q1200  . polyethylene glycol  17 g Oral Daily  . simvastatin  20 mg Oral Daily  . sodium chloride  3 mL Intravenous Q12H  . tolvaptan  15 mg Oral Q24H   Continuous Infusions:  PRN Meds:.sodium chloride, acetaminophen, albuterol, ALPRAZolam, bisacodyl, ipratropium, ondansetron (ZOFRAN) IV, sodium chloride   Assessment: Principal Problem:  *CHF exacerbation Active Problems:  SOB (shortness of breath)  Hypertension  Hypothyroidism  CAD (coronary artery disease)  Confusion  Hyponatremia  COPD bronchitis  UTI (lower urinary tract infection)  Abdominal distention  Hyperkalemia   Plan: #1 confusion secondary to UTI/hyponatremia , improving #2 shortness of breath patient has COPD, also has a component of chronic diastolic heart failure with exacerbation. Restart Lasix today given improving hyponatremia, start tolvaptan as patient is also volume overloaded  #3 CHF exacerbation, repeat echo confirms diastolic heart failure, will try tolvaptan, start Lasix  #4 COPD continue nebs, empiric antibiotics , dc rocephin change to impipenem due to ESBL on Urine cx from NH #5 Hypertension holding Cozaar due to hyperkalemia  #6  hypothyroidism continue Levoxyl  #7 coronary artery disease continue to cycle enzymes  #8 Diabetes continue Lantus sliding scale insulin holding metformin  #9 atrial fibrillation converted back to normal sinus rhythm continue  metoprolol by mouth DNR/DNI  Updated the daughter yesterday about the patient's progress   LOS: 3 days   [facility] 08/17/2011, 10:10 AM

## 2011-08-17 NOTE — Progress Notes (Signed)
ANTIBIOTIC CONSULT NOTE - INITIAL  Pharmacy Consult for Primaxin Indication: UTI  No Known Allergies  Patient Measurements: Height: 5\' 4"  (162.6 cm) Weight: 186 lb 4.8 oz (84.505 kg) IBW/kg (Calculated) : 54.7   Vital Signs: Temp: 97.5 F (36.4 C) (05/26 0550) Temp src: Oral (05/26 0550) BP: 171/64 mmHg (05/26 0550) Pulse Rate: 68  (05/26 0550) Intake/Output from previous day: 05/25 0701 - 05/26 0700 In: 920 [P.O.:920] Out: 950 [Urine:950] Intake/Output from this shift: Total I/O In: 120 [P.O.:120] Out: -   Labs:  Basename 08/17/11 0525 08/16/11 0532 08/15/11 1729 08/15/11 08/14/11 1621  WBC -- -- -- 9.3 9.3  HGB -- -- -- 11.3* 12.0  PLT -- -- -- 247 172  LABCREA -- -- -- -- --  CREATININE 0.83 0.84 0.78 -- --   Estimated Creatinine Clearance: 47.4 ml/min (by C-G formula based on Cr of 0.83).  Microbiology: Recent Results (from the past 720 hour(s))  URINE CULTURE     Status: Normal   Collection Time   08/14/11  4:40 PM      Component Value Range Status Comment   Specimen Description URINE, CATHETERIZED   Final    Special Requests NONE   Final    Culture  Setup Time [id number]   Final    Colony Count >=100,000 COLONIES/ML   Final    Culture     Final    Value: ESCHERICHIA COLI     Note: Confirmed Extended Spectrum Beta-Lactamase Producer (ESBL)   Report Status 08/16/2011 FINAL   Final    Organism ID, Bacteria ESCHERICHIA COLI   Final   MRSA PCR SCREENING     Status: Normal   Collection Time   08/15/11 12:15 AM      Component Value Range Status Comment   MRSA by PCR NEGATIVE  NEGATIVE  Final     Medical History: Past Medical History  Diagnosis Date  . Dysphagia   . Diabetes mellitus   . Hypertension   . Hypothyroidism   . Hyperlipemia   . GERD (gastroesophageal reflux disease)   . Depression   . Anxiety   . Acute MI   . Pneumonia   . Stroke   . UTI (urinary tract infection)   . COPD (chronic obstructive pulmonary disease)   . CAD (coronary  artery disease)   . Late effects of CVA (cerebrovascular accident)   . DEMENTIA     Assessment: 24 YOF with ESBL E.coli growing in urine culture to begin Primaxin for treatment. CrCl(N)~51 ml/min and has been stable.  Goal of Therapy:  doses adjusted per renal clearance  Plan:  Primaxin 500 mg IV q8h. F/u SCr.  [name] 08/17/2011,12:06 PM

## 2011-08-18 ENCOUNTER — Inpatient Hospital Stay (HOSPITAL_COMMUNITY): Payer: Medicare Other

## 2011-08-18 DIAGNOSIS — R197 Diarrhea, unspecified: Secondary | ICD-10-CM

## 2011-08-18 DIAGNOSIS — I509 Heart failure, unspecified: Secondary | ICD-10-CM

## 2011-08-18 DIAGNOSIS — E782 Mixed hyperlipidemia: Secondary | ICD-10-CM

## 2011-08-18 DIAGNOSIS — J441 Chronic obstructive pulmonary disease with (acute) exacerbation: Secondary | ICD-10-CM

## 2011-08-18 LAB — BASIC METABOLIC PANEL
BUN: 18 mg/dL (ref 6–23)
GFR calc non Af Amer: 72 mL/min — ABNORMAL LOW (ref >90)
Glucose, Bld: 263 mg/dL — ABNORMAL HIGH (ref 70–99)
Potassium: 4.1 mEq/L (ref 3.5–5.1)

## 2011-08-18 LAB — CBC
HCT: 34.6 % — ABNORMAL LOW (ref 36.0–46.0)
MCV: 93.5 fL (ref 78.0–100.0)
RDW: 14.5 % (ref 11.5–15.5)
WBC: 16.7 10*3/uL — ABNORMAL HIGH (ref 4.0–10.5)

## 2011-08-18 NOTE — Progress Notes (Signed)
FYI MD -- Pt's daughter, Jason Fila [facility]), is concerned about not receiving Physical Therapy in the past few days. Would like to discuss with doctor outlook of benefits of physical therapy prior to being discharged from hospital.  Please call her at your convenience at [phone]. Thank you.

## 2011-08-18 NOTE — Progress Notes (Signed)
Subjective: Sinus rhythm on telemetry More awake today   Objective: Vital signs in last 24 hours: Filed Vitals:   08/17/11 0700 08/17/11 1439 08/17/11 2124 08/18/11 0450  BP:  183/67 176/60 169/54  Pulse:  65 71 54  Temp:  97.6 F (36.4 C) 97.6 F (36.4 C) 97.3 F (36.3 C)  TempSrc:  Oral Oral Oral  Resp:  20 20 20   Height:      Weight: 84.505 kg (186 lb 4.8 oz)     SpO2:  98% 99% 99%    Intake/Output Summary (Last 24 hours) at 08/18/11 0934 Last data filed at 08/18/11 0847  Gross per 24 hour  Intake    700 ml  Output   2500 ml  Net  -1800 ml    Weight change:   General: in No Acute distress  2. Psychological: Alert and Oriented to self and others.  3. Head/ENT: Moist Mucous Membranes  Head Non traumatic, neck supple  Normal Dentition  4. SKIN: normal Skin turgor, Skin clean Dry and intact no rash  5. Heart: Regular rate and rhythm no Murmur, Rub or gallop  6. Lungs: Clear to auscultation bilaterally, no wheezes patient unable to sit up  7. Abdomen: Soft, non-tender, somewhat distended  8. Lower extremities: no clubbing, cyanosis, or edema  9. Neurologically Grossly intact, moving all 4 extremities equally  10. MSK: Normal range of motion      Lab Results: Results for orders placed during the hospital encounter of 08/14/11 (from the past 24 hour(s))  GLUCOSE, CAPILLARY     Status: Abnormal   Collection Time   08/17/11 11:54 AM      Component Value Range   Glucose-Capillary 203 (*) 70 - 99 (mg/dL)  BASIC METABOLIC PANEL     Status: Abnormal   Collection Time   08/17/11 12:45 PM      Component Value Range   Sodium 131 (*) 135 - 145 (mEq/L)   Potassium 4.4  3.5 - 5.1 (mEq/L)   Chloride 89 (*) 96 - 112 (mEq/L)   CO2 39 (*) 19 - 32 (mEq/L)   Glucose, Bld 222 (*) 70 - 99 (mg/dL)   BUN 17  6 - 23 (mg/dL)   Creatinine, Ser 9.14  0.50 - 1.10 (mg/dL)   Calcium 8.3 (*) 8.4 - 10.5 (mg/dL)   GFR calc non Af Amer 59 (*) >90 (mL/min)   GFR calc Af Amer 68 (*) >90  (mL/min)  GLUCOSE, CAPILLARY     Status: Abnormal   Collection Time   08/17/11  5:03 PM      Component Value Range   Glucose-Capillary 231 (*) 70 - 99 (mg/dL)  GLUCOSE, CAPILLARY     Status: Abnormal   Collection Time   08/17/11  9:20 PM      Component Value Range   Glucose-Capillary 180 (*) 70 - 99 (mg/dL)  CBC     Status: Abnormal   Collection Time   08/18/11  4:35 AM      Component Value Range   WBC 16.7 (*) 4.0 - 10.5 (K/uL)   RBC 3.70 (*) 3.87 - 5.11 (MIL/uL)   Hemoglobin 11.0 (*) 12.0 - 15.0 (g/dL)   HCT 78.2 (*) 95.6 - 46.0 (%)   MCV 93.5  78.0 - 100.0 (fL)   MCH 29.7  26.0 - 34.0 (pg)   MCHC 31.8  30.0 - 36.0 (g/dL)   RDW 21.3  08.6 - 57.8 (%)   Platelets 243  150 - 400 (K/uL)  GLUCOSE, CAPILLARY     Status: Abnormal   Collection Time   08/18/11  7:30 AM      Component Value Range   Glucose-Capillary 118 (*) 70 - 99 (mg/dL)     Micro: Recent Results (from the past 240 hour(s))  URINE CULTURE     Status: Normal   Collection Time   08/14/11  4:40 PM      Component Value Range Status Comment   Specimen Description URINE, CATHETERIZED   Final    Special Requests NONE   Final    Culture  Setup Time [id number]   Final    Colony Count >=100,000 COLONIES/ML   Final    Culture     Final    Value: ESCHERICHIA COLI     Note: Confirmed Extended Spectrum Beta-Lactamase Producer (ESBL)   Report Status 08/16/2011 FINAL   Final    Organism ID, Bacteria ESCHERICHIA COLI   Final   MRSA PCR SCREENING     Status: Normal   Collection Time   08/15/11 12:15 AM      Component Value Range Status Comment   MRSA by PCR NEGATIVE  NEGATIVE  Final     Studies/Results: No results found.  Medications:  Scheduled Meds:   . aspirin  81 mg Oral Daily  . atorvastatin  10 mg Oral q1800  . azithromycin  500 mg Intravenous Q24H  . beta carotene w/minerals  1 tablet Oral Daily  . bisacodyl  10 mg Rectal Once  . diltiazem  180 mg Oral Daily  . enoxaparin  40 mg Subcutaneous QHS  .  feeding supplement  237 mL Oral QAC supper  . furosemide  20 mg Intravenous Q12H  . guaiFENesin  600 mg Oral BID  . hydrALAZINE  25 mg Oral Q6H  . imipenem-cilastatin  500 mg Intravenous Q8H  . insulin aspart  0-15 Units Subcutaneous TID WC  . insulin aspart  0-5 Units Subcutaneous QHS  . insulin glargine  40 Units Subcutaneous QHS  . lactulose  20 g Oral BID  . levothyroxine  100 mcg Oral QAC breakfast  . metoprolol  50 mg Oral BID  . pantoprazole  40 mg Oral Q1200  . polyethylene glycol  17 g Oral Daily  . sodium chloride  3 mL Intravenous Q12H  . tolvaptan  15 mg Oral Q24H  . DISCONTD: cefTRIAXone (ROCEPHIN)  IV  1 g Intravenous Q24H  . DISCONTD: simvastatin  20 mg Oral Daily   Continuous Infusions:  PRN Meds:.sodium chloride, acetaminophen, albuterol, ALPRAZolam, bisacodyl, ipratropium, ondansetron (ZOFRAN) IV, sodium chloride   Assessment: Principal Problem:  *CHF exacerbation Active Problems:  SOB (shortness of breath)  Hypertension  Hypothyroidism  CAD (coronary artery disease)  Confusion  Hyponatremia  COPD bronchitis  UTI (lower urinary tract infection)  Abdominal distention  Hyperkalemia   Plan: #1 confusion secondary to UTI/hyponatremia , improving  #2 shortness of breath patient has COPD, also has a component of chronic diastolic heart failure with exacerbation. Restart Lasix today given improving hyponatremia, discontinue tolvaptan as sodium is 132. Continue Lasix for volume overload #3 CHF exacerbation, repeat echo confirms diastolic heart failure, started Lasix yesterday #4 COPD continue nebs, empiric antibiotics , dc rocephin change to impipenem due to ESBL on Urine cx from NH , repeat chest x-ray because of worsening white count #5 Hypertension holding Cozaar due to hyperkalemia  #6 hypothyroidism continue Levoxyl  #7 coronary artery disease continue to cycle enzymes  #8 Diabetes continue Lantus sliding scale insulin  holding metformin  #9 atrial  fibrillation converted back to normal sinus rhythm continue metoprolol by mouth , added diltiazem, patient and family still have to make a decision about long-term anticoagulation DNR/DNI    LOS: 4 days   [facility] 08/18/2011, 9:34 AM

## 2011-08-19 LAB — GLUCOSE, CAPILLARY
Glucose-Capillary: 125 mg/dL — ABNORMAL HIGH (ref 70–99)
Glucose-Capillary: 203 mg/dL — ABNORMAL HIGH (ref 70–99)
Glucose-Capillary: 236 mg/dL — ABNORMAL HIGH (ref 70–99)

## 2011-08-19 MED ORDER — FUROSEMIDE 10 MG/ML IJ SOLN
40.0000 mg | Freq: Two times a day (BID) | INTRAMUSCULAR | Status: DC
  Administered 2011-08-19 – 2011-08-20 (×3): 40 mg via INTRAVENOUS
  Filled 2011-08-19 (×4): qty 4

## 2011-08-19 NOTE — Evaluation (Signed)
Physical Therapy Evaluation Patient Details Name: [name] MRN: [id number] DOB: [birth year] Today's Date: 08/19/2011 Time: 2130-8657 PT Time Calculation (min): 27 min  PT Assessment / Plan / Recommendation Clinical Impression  Pt admitted for CHF from SNF.  Attempted to call SNF to check if pt was receiving PT however, no response.  Daughter present in room and able to provide PLOF and reports pt was receiving PT at SNF and working on taking a couple steps.  Pt would benefit from acute PT services in order to increase strength and independence with mobility to prepare for d/c back to SNF.    PT Assessment  Patient needs continued PT services    Follow Up Recommendations  Skilled nursing facility    Barriers to Discharge        lEquipment Recommendations  Defer to next venue    Recommendations for Other Services     Frequency Min 3X/week    Precautions / Restrictions Precautions Precautions: Fall   Pertinent Vitals/Pain       Mobility  Bed Mobility Bed Mobility: Sit to Supine Sit to Supine: 3: Mod assist Details for Bed Mobility Assistance: slight assist for trunk, most assist for LEs onto bed Transfers Transfers: Sit to Stand;Stand to Sit;Stand Pivot Transfers Sit to Stand: 1: +2 Total assist;With upper extremity assist;From chair/3-in-1 Sit to Stand: Patient Percentage: 40% Stand to Sit: To bed;To chair/3-in-1;1: +2 Total assist Stand to Sit: Patient Percentage: 60% Stand Pivot Transfers: 1: +2 Total assist;Other (comment) (with RW) Stand Pivot Transfers: Patient Percentage: 50% Details for Transfer Assistance: increased verbal cues for safe technique, pt fatigues quickly requiring many verbal cues to straighten knees and posture as pt tends to sink into more of sitting position, pt assisted to [facility] and required total care for hygiene as well as assist for balance/weakness.    Exercises     PT Diagnosis: Generalized weakness  PT Problem List: Decreased  strength;Decreased activity tolerance;Decreased mobility;Decreased balance;Decreased safety awareness;Decreased knowledge of use of DME;Obesity PT Treatment Interventions: DME instruction;Gait training;Functional mobility training;Therapeutic activities;Therapeutic exercise;Balance training;Patient/family education;Wheelchair mobility training   PT Goals Acute Rehab PT Goals PT Goal Formulation: With patient Time For Goal Achievement: 08/26/11 Potential to Achieve Goals: Fair Pt will go Supine/Side to Sit: with supervision PT Goal: Supine/Side to Sit - Progress: Goal set today Pt will go Sit to Supine/Side: with supervision PT Goal: Sit to Supine/Side - Progress: Goal set today Pt will go Sit to Stand: with min assist PT Goal: Sit to Stand - Progress: Goal set today Pt will go Stand to Sit: with min assist PT Goal: Stand to Sit - Progress: Goal set today Pt will Transfer Bed to Chair/Chair to Bed: with min assist PT Transfer Goal: Bed to Chair/Chair to Bed - Progress: Goal set today  Visit Information  Last PT Received On: 08/19/11 Assistance Needed: +2    Subjective Data  Subjective: mostly nods yes/no   Prior Functioning  Home Living Available Help at Discharge: Skilled Nursing Facility Additional Comments: daughter reports pt was receiving therapy prior to admission Prior Function Level of Independence: Needs assistance Comments: Daughter reports pt was mod I with transfer to w/c and using w/c and working on taking a few steps with ambulation in PT Communication Communication: HOH    Cognition  Overall Cognitive Status: History of cognitive impairments - at baseline Arousal/Alertness: Awake/alert Behavior During Session: [facility] for tasks performed    Extremity/Trunk Assessment Right Lower Extremity Assessment RLE ROM/Strength/Tone: Deficits RLE ROM/Strength/Tone Deficits: grossly  at least 3+/5 per functional observation Left Lower Extremity Assessment LLE  ROM/Strength/Tone: Deficits LLE ROM/Strength/Tone Deficits: grossly at least 3+/5 per functional observation   Balance    End of Session PT - End of Session Activity Tolerance: Patient limited by fatigue Patient left: in bed;with call bell/phone within reach;with family/visitor present   Merinda Victorino,KATHrine E 08/19/2011, 3:24 PM Pager: [phone]

## 2011-08-19 NOTE — Progress Notes (Addendum)
Subjective: [name] is a 78 y.o. female  has a past medical history of Dysphagia; Diabetes mellitus; Hypertension; Hypothyroidism; Hyperlipemia; GERD (gastroesophageal reflux disease); Depression; Anxiety; Acute MI; Pneumonia; Stroke; UTI (urinary tract infection); COPD (chronic obstructive pulmonary disease); CAD (coronary artery disease); Late effects of CVA (cerebrovascular accident); and DEMENTIA.   Presented with confusion since last week and was somewhat more lethargic than usual. She does have occasional episodes of hypoglycemia. No recent fever or chills.  she had some increased peripheral edema. On Sunday she have had increased work of breathing and needed more oxygen. Since Easter she has required some O2. She presented with wheezing and cough. She has been more lethargic. Since Easter she has been on increased dose on benzos.  For shortness of breath she was thought to have a combination of COPD exacerbation, acute on chronic diastolic heart failure as well as probable early pneumonia.  She was initially treated with Rocephin and Zithromax. To cover for pneumonia/UTI. She was subsequently found to have ESBL urinary tract infection and was changed to imipenem  She was also found to be hyponatremic with a sodium of 120 and was started on samsca . Sodium improved to 133 , smasca has now been discontinued , Lasix was held initially because of concern about hyponatremia, however because of continuous improvement in the sodium, IV Lasix has been restarted for congestive heart failure     Objective: Vital signs in last 24 hours: Filed Vitals:   08/17/11 2124 08/18/11 0450 08/18/11 1449 08/18/11 2147  BP: 176/60 169/54 160/60 163/67  Pulse: 71 54 57 73  Temp: 97.6 F (36.4 C) 97.3 F (36.3 C) 97.9 F (36.6 C) 97.6 F (36.4 C)  TempSrc: Oral Oral Oral Oral  Resp: 20 20 20 20   Height:      Weight:      SpO2: 99% 99% 98%     Intake/Output Summary (Last 24 hours) at 08/19/11  0357 Last data filed at 08/19/11 0146  Gross per 24 hour  Intake    700 ml  Output   1525 ml  Net   -825 ml    Weight change:   1. General: in No Acute distress  2. Psychological: Alert and Oriented to self and others.  3. Head/ENT: Moist Mucous Membranes  Head Non traumatic, neck supple  Normal Dentition  4. SKIN: normal Skin turgor, Skin clean Dry and intact no rash  5. Heart: Regular rate and rhythm no Murmur, Rub or gallop  6. Lungs: Clear to auscultation bilaterally, no wheezes patient unable to sit up  7. Abdomen: Soft, non-tender, somewhat distended  8. Lower extremities: no clubbing, cyanosis, or edema  9. Neurologically Grossly intact, moving all 4 extremities equally  10. MSK: Normal range of motion   Lab Results: Results for orders placed during the hospital encounter of 08/14/11 (from the past 24 hour(s))  CBC     Status: Abnormal   Collection Time   08/18/11  4:35 AM      Component Value Range   WBC 16.7 (*) 4.0 - 10.5 (K/uL)   RBC 3.70 (*) 3.87 - 5.11 (MIL/uL)   Hemoglobin 11.0 (*) 12.0 - 15.0 (g/dL)   HCT 40.9 (*) 81.1 - 46.0 (%)   MCV 93.5  78.0 - 100.0 (fL)   MCH 29.7  26.0 - 34.0 (pg)   MCHC 31.8  30.0 - 36.0 (g/dL)   RDW 91.4  78.2 - 95.6 (%)   Platelets 243  150 - 400 (K/uL)  GLUCOSE, CAPILLARY     Status: Abnormal   Collection Time   08/18/11  7:30 AM      Component Value Range   Glucose-Capillary 118 (*) 70 - 99 (mg/dL)  GLUCOSE, CAPILLARY     Status: Abnormal   Collection Time   08/18/11 11:16 AM      Component Value Range   Glucose-Capillary 245 (*) 70 - 99 (mg/dL)  GLUCOSE, CAPILLARY     Status: Abnormal   Collection Time   08/18/11  4:48 PM      Component Value Range   Glucose-Capillary 260 (*) 70 - 99 (mg/dL)  BASIC METABOLIC PANEL     Status: Abnormal   Collection Time   08/18/11  5:17 PM      Component Value Range   Sodium 133 (*) 135 - 145 (mEq/L)   Potassium 4.1  3.5 - 5.1 (mEq/L)   Chloride 90 (*) 96 - 112 (mEq/L)   CO2 38 (*) 19  - 32 (mEq/L)   Glucose, Bld 263 (*) 70 - 99 (mg/dL)   BUN 18  6 - 23 (mg/dL)   Creatinine, Ser 1.61  0.50 - 1.10 (mg/dL)   Calcium 8.0 (*) 8.4 - 10.5 (mg/dL)   GFR calc non Af Amer 72 (*) >90 (mL/min)   GFR calc Af Amer 83 (*) >90 (mL/min)  GLUCOSE, CAPILLARY     Status: Abnormal   Collection Time   08/18/11 10:12 PM      Component Value Range   Glucose-Capillary 213 (*) 70 - 99 (mg/dL)     Micro: Recent Results (from the past 240 hour(s))  URINE CULTURE     Status: Normal   Collection Time   08/14/11  4:40 PM      Component Value Range Status Comment   Specimen Description URINE, CATHETERIZED   Final    Special Requests NONE   Final    Culture  Setup Time [id number]   Final    Colony Count >=100,000 COLONIES/ML   Final    Culture     Final    Value: ESCHERICHIA COLI     Note: Confirmed Extended Spectrum Beta-Lactamase Producer (ESBL)   Report Status 08/16/2011 FINAL   Final    Organism ID, Bacteria ESCHERICHIA COLI   Final   MRSA PCR SCREENING     Status: Normal   Collection Time   08/15/11 12:15 AM      Component Value Range Status Comment   MRSA by PCR NEGATIVE  NEGATIVE  Final     Studies/Results: Dg Chest 1 View  08/18/2011  *RADIOLOGY REPORT*  Clinical Data: Shortness of breath.  CHEST - 1 VIEW  Comparison: 08/14/2011  Findings: The patient has a slightly increased interstitial accentuation consistent with mild pulmonary edema.  Chronic cardiomegaly.  Pulmonary vascularity has increased.  No discrete effusions.  IMPRESSION: Increased slight pulmonary edema.  Original Report Authenticated By: Gwynn Burly, M.D.    Medications:  Scheduled Meds:   . aspirin  81 mg Oral Daily  . atorvastatin  10 mg Oral q1800  . azithromycin  500 mg Intravenous Q24H  . beta carotene w/minerals  1 tablet Oral Daily  . diltiazem  180 mg Oral Daily  . enoxaparin  40 mg Subcutaneous QHS  . feeding supplement  237 mL Oral QAC supper  . furosemide  40 mg Intravenous Q12H  .  guaiFENesin  600 mg Oral BID  . hydrALAZINE  25 mg Oral Q6H  . imipenem-cilastatin  500 mg  Intravenous Q8H  . insulin aspart  0-15 Units Subcutaneous TID WC  . insulin aspart  0-5 Units Subcutaneous QHS  . insulin glargine  40 Units Subcutaneous QHS  . lactulose  20 g Oral BID  . levothyroxine  100 mcg Oral QAC breakfast  . metoprolol  50 mg Oral BID  . pantoprazole  40 mg Oral Q1200  . polyethylene glycol  17 g Oral Daily  . sodium chloride  3 mL Intravenous Q12H  . DISCONTD: furosemide  20 mg Intravenous Q12H  . DISCONTD: tolvaptan  15 mg Oral Q24H   Continuous Infusions:  PRN Meds:.sodium chloride, acetaminophen, albuterol, ALPRAZolam, bisacodyl, ipratropium, ondansetron (ZOFRAN) IV, sodium chloride   Assessment: Principal Problem:  *CHF exacerbation Active Problems:  SOB (shortness of breath)  Hypertension  Hypothyroidism  CAD (coronary artery disease)  Confusion  Hyponatremia  COPD bronchitis  UTI (lower urinary tract infection)  Abdominal distention  Hyperkalemia   Plan:  #1 confusion secondary to UTI/hyponatremia , improving  #2 shortness of breath patient has COPD, also has a component of chronic diastolic heart failure with exacerbation. Restarted Lasix   given improving hyponatremia, discontinued tolvaptan as sodium is 132. Continue Lasix for volume overload , sodium continues to improve slowly #3 CHF exacerbation, repeat echo confirms diastolic heart failure, started Lasix yesterday , on fluid restriction #4 COPD continue nebs, empiric antibiotics, possible early PNA , dc rocephin change to impipenem due to ESBL on Urine cx from NH , repeat chest x-ray because of worsening white count  #5 Hypertension holding Cozaar due to hyperkalemia , hyperkalemia rx with kayexalate  #6 hypothyroidism continue Levoxyl  #7 coronary artery disease continue to cycle enzymes  #8 Diabetes continue Lantus sliding scale insulin holding metformin  #9 atrial fibrillation found to be  paroxysmal;  converted back to normal sinus rhythm continue metoprolol by mouth , added diltiazem, patient and family declined  long-term anticoagulation  #10 disposition patient is not ambulatory at baseline and physical therapy signed off 4 days ago, per family request this will be requested again. Anticipate discharge tomorrow DNR/DNI   LOS: 5 days   [facility] 08/19/2011, 3:57 AM

## 2011-08-20 DIAGNOSIS — E782 Mixed hyperlipidemia: Secondary | ICD-10-CM

## 2011-08-20 DIAGNOSIS — I509 Heart failure, unspecified: Secondary | ICD-10-CM

## 2011-08-20 DIAGNOSIS — J441 Chronic obstructive pulmonary disease with (acute) exacerbation: Secondary | ICD-10-CM

## 2011-08-20 DIAGNOSIS — R197 Diarrhea, unspecified: Secondary | ICD-10-CM

## 2011-08-20 LAB — GLUCOSE, CAPILLARY
Glucose-Capillary: 275 mg/dL — ABNORMAL HIGH (ref 70–99)
Glucose-Capillary: 149 mg/dL — ABNORMAL HIGH (ref 70–99)
Glucose-Capillary: 212 mg/dL — ABNORMAL HIGH (ref 70–99)
Glucose-Capillary: 199 mg/dL — ABNORMAL HIGH (ref 70–99)

## 2011-08-20 LAB — CBC
HCT: 35.8 % — ABNORMAL LOW (ref 36.0–46.0)
Hemoglobin: 11.6 g/dL — ABNORMAL LOW (ref 12.0–15.0)
RBC: 3.9 MIL/uL (ref 3.87–5.11)
WBC: 12 10*3/uL — ABNORMAL HIGH (ref 4.0–10.5)

## 2011-08-20 LAB — BASIC METABOLIC PANEL
CO2: 41 mEq/L (ref 19–32)
Glucose, Bld: 143 mg/dL — ABNORMAL HIGH (ref 70–99)
Potassium: 3.7 mEq/L (ref 3.5–5.1)
Sodium: 136 mEq/L (ref 135–145)

## 2011-08-20 MED ORDER — FUROSEMIDE 40 MG PO TABS
40.0000 mg | ORAL_TABLET | Freq: Two times a day (BID) | ORAL | Status: DC
  Administered 2011-08-20 – 2011-08-21 (×2): 40 mg via ORAL
  Filled 2011-08-20 (×4): qty 1

## 2011-08-20 MED ORDER — LOSARTAN POTASSIUM 50 MG PO TABS
50.0000 mg | ORAL_TABLET | Freq: Every day | ORAL | Status: DC
  Administered 2011-08-20 – 2011-08-21 (×2): 50 mg via ORAL
  Filled 2011-08-20 (×2): qty 1

## 2011-08-20 MED ORDER — LOSARTAN POTASSIUM 50 MG PO TABS: 50.0000 mg | ORAL_TABLET | Freq: Every day | ORAL | Status: DC

## 2011-08-20 MED ORDER — HYDRALAZINE HCL 50 MG PO TABS
50.0000 mg | ORAL_TABLET | Freq: Four times a day (QID) | ORAL | Status: DC
  Administered 2011-08-20 – 2011-08-21 (×4): 50 mg via ORAL
  Filled 2011-08-20 (×8): qty 1

## 2011-08-20 MED ORDER — HYDRALAZINE HCL 50 MG PO TABS
50.0000 mg | ORAL_TABLET | Freq: Four times a day (QID) | ORAL | Status: DC
  Filled 2011-08-20 (×3): qty 1

## 2011-08-20 MED ORDER — ENSURE COMPLETE PO LIQD
237.0000 mL | Freq: Three times a day (TID) | ORAL | Status: DC
  Administered 2011-08-20 – 2011-08-21 (×2): 237 mL via ORAL

## 2011-08-20 NOTE — Progress Notes (Signed)
ANTIBIOTIC CONSULT NOTE - FOLLOW UP  Pharmacy Consult for Primaxin Indication: ESBL UTI  No Known Allergies  Patient Measurements: Height: 5\' 4"  (162.6 cm) Weight: 176 lb 5.9 oz (80 kg) IBW/kg (Calculated) : 54.7   Vital Signs: Temp: 95.2 F (35.1 C) (05/29 0846) Temp src: Axillary (05/29 0846) BP: 179/71 mmHg (05/29 0843) Pulse Rate: 62  (05/29 0843) Intake/Output from previous day: 05/28 0701 - 05/29 0700 In: 768 [P.O.:460; IV Piggyback:308] Out: 1175 [Urine:1175] Intake/Output from this shift: Total I/O In: -  Out: 1000 [Urine:1000]  Labs:  [facility] 08/20/11 0420 08/18/11 1717 08/18/11 0435  WBC 12.0* -- 16.7*  HGB 11.6* -- 11.0*  PLT 252 -- 243  LABCREA -- -- --  CREATININE 0.67 0.77 --   Estimated Creatinine Clearance: 47.8 ml/min (by C-G formula based on Cr of 0.67). No results found for this basename: VANCOTROUGH:2,VANCOPEAK:2,VANCORANDOM:2,GENTTROUGH:2,GENTPEAK:2,GENTRANDOM:2,TOBRATROUGH:2,TOBRAPEAK:2,TOBRARND:2,AMIKACINPEAK:2,AMIKACINTROU:2,AMIKACIN:2, in the last 72 hours   Microbiology: Recent Results (from the past 720 hour(s))  URINE CULTURE     Status: Normal   Collection Time   08/14/11  4:40 PM      Component Value Range Status Comment   Specimen Description URINE, CATHETERIZED   Final    Special Requests NONE   Final    Culture  Setup Time [id number]   Final    Colony Count >=100,000 COLONIES/ML   Final    Culture     Final    Value: ESCHERICHIA COLI     Note: Confirmed Extended Spectrum Beta-Lactamase Producer (ESBL)   Report Status 08/16/2011 FINAL   Final    Organism ID, Bacteria ESCHERICHIA COLI   Final   MRSA PCR SCREENING     Status: Normal   Collection Time   08/15/11 12:15 AM      Component Value Range Status Comment   MRSA by PCR NEGATIVE  NEGATIVE  Final     Anti-infectives     Start     Dose/Rate Route Frequency Ordered Stop   08/17/11 1400   imipenem-cilastatin (PRIMAXIN) 500 mg in sodium chloride 0.9 % 100 mL IVPB        500 mg 200 mL/hr over 30 Minutes Intravenous 3 times per day 08/17/11 1209     08/15/11 2100   azithromycin (ZITHROMAX) 500 mg in dextrose 5 % 250 mL IVPB  Status:  Discontinued        500 mg 250 mL/hr over 60 Minutes Intravenous Every 24 hours 08/15/11 0921 08/19/11 0909   08/15/11 1000   azithromycin (ZITHROMAX) tablet 250 mg  Status:  Discontinued        250 mg Oral Daily 08/14/11 2124 08/15/11 0921   08/14/11 2130   cefTRIAXone (ROCEPHIN) 1 g in dextrose 5 % 50 mL IVPB  Status:  Discontinued        1 g 100 mL/hr over 30 Minutes Intravenous Every 24 hours 08/14/11 2124 08/17/11 1145   08/14/11 2130   azithromycin (ZITHROMAX) tablet 500 mg        500 mg Oral Daily 08/14/11 2124 08/15/11 0119   08/14/11 1830   cefTRIAXone (ROCEPHIN) 1 g in dextrose 5 % 50 mL IVPB        1 g 100 mL/hr over 30 Minutes Intravenous  Once 08/14/11 1832 08/14/11 1934          Assessment: 78 yo F on Day # 4 Primaxin for ESBL UTI. CrCl (n) ~ 62 ml/min. Primaxin dose remains appropriate for renal function.  Plan:  Continue current Primaxin regimen.  Darrol Angel, PharmD Pager: [phone] 08/20/2011,1:55 PM

## 2011-08-20 NOTE — Progress Notes (Signed)
Subjective: Feeling slightly better today.  Daughter by bedside.  Objective: Vital signs in last 24 hours: Filed Vitals:   08/19/11 2100 08/20/11 0713 08/20/11 0843 08/20/11 0846  BP: 182/53 191/70 179/71   Pulse: 72 60 62   Temp: 97.5 F (36.4 C) 95.7 F (35.4 C) 94.5 F (34.7 C) 95.2 F (35.1 C)  TempSrc: Oral Axillary Oral Axillary  Resp: 22 20    Height:      Weight:  80 kg (176 lb 5.9 oz)    SpO2: 99% 100%     Weight change:   Intake/Output Summary (Last 24 hours) at 08/20/11 1251 Last data filed at 08/20/11 0716  Gross per 24 hour  Intake    644 ml  Output   2175 ml  Net  -1531 ml    Physical Exam: General: Awake, Oriented, No acute distress. HEENT: EOMI. Neck: Supple CV: S1 and S2 Lungs: Clear to ascultation bilaterally Abdomen: Soft, Nontender, Nondistended, +bowel sounds. Ext: Good pulses. Trace edema.  Lab Results:  Basename 08/20/11 0420 08/18/11 1717  NA 136 133*  K 3.7 4.1  CL 89* 90*  CO2 41* 38*  GLUCOSE 143* 263*  BUN 14 18  CREATININE 0.67 0.77  CALCIUM 8.6 8.0*  MG -- --  PHOS -- --   No results found for this basename: AST:2,ALT:2,ALKPHOS:2,BILITOT:2,PROT:2,ALBUMIN:2 in the last 72 hours No results found for this basename: LIPASE:2,AMYLASE:2 in the last 72 hours  Basename 08/20/11 0420 08/18/11 0435  WBC 12.0* 16.7*  NEUTROABS -- --  HGB 11.6* 11.0*  HCT 35.8* 34.6*  MCV 91.8 93.5  PLT 252 243   No results found for this basename: CKTOTAL:3,CKMB:3,CKMBINDEX:3,TROPONINI:3 in the last 72 hours No components found with this basename: POCBNP:3 No results found for this basename: DDIMER:2 in the last 72 hours No results found for this basename: HGBA1C:2 in the last 72 hours No results found for this basename: CHOL:2,HDL:2,LDLCALC:2,TRIG:2,CHOLHDL:2,LDLDIRECT:2 in the last 72 hours No results found for this basename: TSH,T4TOTAL,FREET3,T3FREE,THYROIDAB in the last 72 hours No results found for this basename:  VITAMINB12:2,FOLATE:2,FERRITIN:2,TIBC:2,IRON:2,RETICCTPCT:2 in the last 72 hours  Micro Results: Recent Results (from the past 240 hour(s))  URINE CULTURE     Status: Normal   Collection Time   08/14/11  4:40 PM      Component Value Range Status Comment   Specimen Description URINE, CATHETERIZED   Final    Special Requests NONE   Final    Culture  Setup Time [id number]   Final    Colony Count >=100,000 COLONIES/ML   Final    Culture     Final    Value: ESCHERICHIA COLI     Note: Confirmed Extended Spectrum Beta-Lactamase Producer (ESBL)   Report Status 08/16/2011 FINAL   Final    Organism ID, Bacteria ESCHERICHIA COLI   Final   MRSA PCR SCREENING     Status: Normal   Collection Time   08/15/11 12:15 AM      Component Value Range Status Comment   MRSA by PCR NEGATIVE  NEGATIVE  Final     Studies/Results: No results found.  Medications: I have reviewed the patient's current medications. Scheduled Meds:   . aspirin  81 mg Oral Daily  . atorvastatin  10 mg Oral q1800  . beta carotene w/minerals  1 tablet Oral Daily  . diltiazem  180 mg Oral Daily  . enoxaparin  40 mg Subcutaneous QHS  . feeding supplement  237 mL Oral QAC supper  . furosemide  40  mg Intravenous Q12H  . guaiFENesin  600 mg Oral BID  . hydrALAZINE  50 mg Oral Q6H  . imipenem-cilastatin  500 mg Intravenous Q8H  . insulin aspart  0-15 Units Subcutaneous TID WC  . insulin aspart  0-5 Units Subcutaneous QHS  . insulin glargine  40 Units Subcutaneous QHS  . lactulose  20 g Oral BID  . levothyroxine  100 mcg Oral QAC breakfast  . metoprolol  50 mg Oral BID  . pantoprazole  40 mg Oral Q1200  . polyethylene glycol  17 g Oral Daily  . sodium chloride  3 mL Intravenous Q12H  . DISCONTD: hydrALAZINE  25 mg Oral Q6H   Continuous Infusions:  PRN Meds:.sodium chloride, acetaminophen, albuterol, ALPRAZolam, bisacodyl, ipratropium, ondansetron (ZOFRAN) IV, sodium chloride  Assessment/Plan: Altered mental status/Acute  Delirum Secondary to UTI/hyponatremia, improving.  Shortness of breath  Patient has COPD, which is stable at this time, also has a component of chronic diastolic heart failure with exacerbation. Improved.  Continue lasix.  Hyponatremia Was initially on tolvaptan for hyponatremia, which was discontinued with improvement in sodium.   ESBL UTI Initially was on ceftriaxone which has been discontinued, currently on imipenem since 08/17/2011.  Will need to discuss with ID for duration of antibiotics.  Acute diastolic congestive heart failure Improved with diuresis.  2-D echocardiogram on 08/15/2011 showed ejection fraction 60-65%, grade 1 diastolic dysfunction, and mild mitral regurgitation.  Change IV Lasix to oral furosemide.  Hypertension Not well controlled.  Restart patient's losartan.  Increase hydralazine.  Continue diltiazem, and metoprolol.  COPD Continue neb treatment.  Completed empiric antibiotics.  Leukocytosis Improved.  A. fib paroxysmal Converted back to sinus rhythm.  Continue diltiazem.  Patient and family declined long-term anticoagulation.  Hypothyroidism Continue Synthroid.  Coronary disease Stable.  CODE STATUS DO NOT RESUSCITATE/DO NOT INTUBATE.  Disposition Consider discharge in the next one to 2 days.   LOS: 6 days  Nasha Diss A, MD 08/20/2011, 12:51 PM

## 2011-08-21 DIAGNOSIS — J441 Chronic obstructive pulmonary disease with (acute) exacerbation: Secondary | ICD-10-CM

## 2011-08-21 DIAGNOSIS — R197 Diarrhea, unspecified: Secondary | ICD-10-CM

## 2011-08-21 DIAGNOSIS — E782 Mixed hyperlipidemia: Secondary | ICD-10-CM

## 2011-08-21 DIAGNOSIS — I509 Heart failure, unspecified: Secondary | ICD-10-CM

## 2011-08-21 LAB — CBC
MCH: 29.7 pg (ref 26.0–34.0)
MCHC: 32.3 g/dL (ref 30.0–36.0)
Platelets: 273 10*3/uL (ref 150–400)
RBC: 4.07 MIL/uL (ref 3.87–5.11)
RDW: 14.1 % (ref 11.5–15.5)

## 2011-08-21 LAB — GLUCOSE, CAPILLARY
Glucose-Capillary: 153 mg/dL — ABNORMAL HIGH (ref 70–99)
Glucose-Capillary: 276 mg/dL — ABNORMAL HIGH (ref 70–99)

## 2011-08-21 LAB — BASIC METABOLIC PANEL
CO2: 37 mEq/L — ABNORMAL HIGH (ref 19–32)
Calcium: 8.7 mg/dL (ref 8.4–10.5)
GFR calc Af Amer: 87 mL/min — ABNORMAL LOW (ref >90)
GFR calc non Af Amer: 75 mL/min — ABNORMAL LOW (ref >90)
Sodium: 133 mEq/L — ABNORMAL LOW (ref 135–145)

## 2011-08-21 MED ORDER — FUROSEMIDE 40 MG PO TABS: 40.0000 mg | ORAL_TABLET | Freq: Two times a day (BID) | ORAL | Status: DC

## 2011-08-21 MED ORDER — BISACODYL 10 MG RE SUPP: 10.0000 mg | Freq: Every day | RECTAL | Status: AC | PRN

## 2011-08-21 MED ORDER — DILTIAZEM HCL ER COATED BEADS 180 MG PO CP24: 180.0000 mg | ORAL_CAPSULE | Freq: Every day | ORAL | Status: DC

## 2011-08-21 MED ORDER — INSULIN ASPART 100 UNIT/ML ~~LOC~~ SOLN
3.0000 [IU] | Freq: Three times a day (TID) | SUBCUTANEOUS | Status: DC
  Administered 2011-08-21: 3 [IU] via SUBCUTANEOUS

## 2011-08-21 MED ORDER — HYDRALAZINE HCL 50 MG PO TABS: 50.0000 mg | ORAL_TABLET | Freq: Four times a day (QID) | ORAL | Status: DC

## 2011-08-21 MED ORDER — ENSURE COMPLETE PO LIQD: 237.0000 mL | Freq: Three times a day (TID) | ORAL | Status: DC

## 2011-08-21 MED ORDER — LACTULOSE 10 GM/15ML PO SOLN: 20.0000 g | Freq: Two times a day (BID) | ORAL | Status: DC

## 2011-08-21 MED ORDER — INSULIN GLARGINE 100 UNIT/ML ~~LOC~~ SOLN: 45.0000 [IU] | Freq: Every day | SUBCUTANEOUS | Status: DC

## 2011-08-21 NOTE — Progress Notes (Signed)
Subjective: Feeling better. Breathing better. Denies any pain.  Objective: Vital signs in last 24 hours: Filed Vitals:   08/20/11 0846 08/20/11 1300 08/20/11 2232 08/21/11 0600  BP:   184/66 163/63  Pulse:  68 75 61  Temp: 95.2 F (35.1 C) 97.5 F (36.4 C) 97.4 F (36.3 C) 97.3 F (36.3 C)  TempSrc: Axillary  Oral Oral  Resp:  23 28 20   Height:      Weight:    80.3 kg (177 lb 0.5 oz)  SpO2:  100% 98% 98%   Weight change:   Intake/Output Summary (Last 24 hours) at 08/21/11 1105 Last data filed at 08/21/11 0606  Gross per 24 hour  Intake    100 ml  Output   1425 ml  Net  -1325 ml    Physical Exam: General: Awake, Oriented, No acute distress. HEENT: EOMI. Neck: Supple CV: S1 and S2 Lungs: Clear to ascultation bilaterally Abdomen: Soft, Nontender, Nondistended, +bowel sounds. Ext: Good pulses. Trace edema.  Lab Results:  Basename 08/21/11 0908 08/20/11 0420  NA 133* 136  K 3.6 3.7  CL 88* 89*  CO2 37* 41*  GLUCOSE 200* 143*  BUN 17 14  CREATININE 0.67 0.67  CALCIUM 8.7 8.6  MG -- --  PHOS -- --   No results found for this basename: AST:2,ALT:2,ALKPHOS:2,BILITOT:2,PROT:2,ALBUMIN:2 in the last 72 hours No results found for this basename: LIPASE:2,AMYLASE:2 in the last 72 hours  Basename 08/21/11 0908 08/20/11 0420  WBC 10.0 12.0*  NEUTROABS -- --  HGB 12.1 11.6*  HCT 37.5 35.8*  MCV 92.1 91.8  PLT 273 252   No results found for this basename: CKTOTAL:3,CKMB:3,CKMBINDEX:3,TROPONINI:3 in the last 72 hours No components found with this basename: POCBNP:3 No results found for this basename: DDIMER:2 in the last 72 hours No results found for this basename: HGBA1C:2 in the last 72 hours No results found for this basename: CHOL:2,HDL:2,LDLCALC:2,TRIG:2,CHOLHDL:2,LDLDIRECT:2 in the last 72 hours No results found for this basename: TSH,T4TOTAL,FREET3,T3FREE,THYROIDAB in the last 72 hours No results found for this basename:  VITAMINB12:2,FOLATE:2,FERRITIN:2,TIBC:2,IRON:2,RETICCTPCT:2 in the last 72 hours  Micro Results: Recent Results (from the past 240 hour(s))  URINE CULTURE     Status: Normal   Collection Time   08/14/11  4:40 PM      Component Value Range Status Comment   Specimen Description URINE, CATHETERIZED   Final    Special Requests NONE   Final    Culture  Setup Time [id number]   Final    Colony Count >=100,000 COLONIES/ML   Final    Culture     Final    Value: ESCHERICHIA COLI     Note: Confirmed Extended Spectrum Beta-Lactamase Producer (ESBL)   Report Status 08/16/2011 FINAL   Final    Organism ID, Bacteria ESCHERICHIA COLI   Final   MRSA PCR SCREENING     Status: Normal   Collection Time   08/15/11 12:15 AM      Component Value Range Status Comment   MRSA by PCR NEGATIVE  NEGATIVE  Final     Studies/Results: No results found.  Medications: I have reviewed the patient's current medications. Scheduled Meds:    . aspirin  81 mg Oral Daily  . atorvastatin  10 mg Oral q1800  . beta carotene w/minerals  1 tablet Oral Daily  . diltiazem  180 mg Oral Daily  . enoxaparin  40 mg Subcutaneous QHS  . feeding supplement  237 mL Oral TID BM  . furosemide  40  mg Oral BID  . guaiFENesin  600 mg Oral BID  . hydrALAZINE  50 mg Oral Q6H  . imipenem-cilastatin  500 mg Intravenous Q8H  . insulin aspart  0-15 Units Subcutaneous TID WC  . insulin aspart  0-5 Units Subcutaneous QHS  . insulin aspart  3 Units Subcutaneous TID WC  . insulin glargine  45 Units Subcutaneous QHS  . lactulose  20 g Oral BID  . levothyroxine  100 mcg Oral QAC breakfast  . losartan  50 mg Oral Daily  . metoprolol  50 mg Oral BID  . pantoprazole  40 mg Oral Q1200  . polyethylene glycol  17 g Oral Daily  . sodium chloride  3 mL Intravenous Q12H  . DISCONTD: feeding supplement  237 mL Oral QAC supper  . DISCONTD: furosemide  40 mg Intravenous Q12H  . DISCONTD: hydrALAZINE  25 mg Oral Q6H  . DISCONTD: hydrALAZINE  50  mg Oral Q6H  . DISCONTD: insulin glargine  40 Units Subcutaneous QHS  . DISCONTD: losartan  50 mg Oral Daily   Continuous Infusions:  PRN Meds:.sodium chloride, acetaminophen, albuterol, ALPRAZolam, bisacodyl, ipratropium, ondansetron (ZOFRAN) IV, sodium chloride  Assessment/Plan: Altered mental status/Acute Delirum Secondary to UTI/hyponatremia, per daughter at baseline.  Shortness of breath  Patient has COPD, which is stable at this time, also has a component of chronic diastolic heart failure with exacerbation. Improved, with diuresis and neb treatments.  Continue lasix.  Hyponatremia Was initially on tolvaptan for hyponatremia, which was discontinued on 08/17/2011 with improvement in sodium. Patient to have BMET checked on 08/25/2011, for sodium check.  ESBL UTI Initially was on ceftriaxone which has been discontinued, currently on imipenem since 08/17/2011, completed 5 day course of imipenem, per ID Dr. Drue Second was adequately treated.  Acute diastolic congestive heart failure Improved with diuresis.  2-D echocardiogram on 08/15/2011 showed ejection fraction 60-65%, grade 1 diastolic dysfunction, and mild mitral regurgitation.  Was on IV Lasix which was transitioned to oral furosemide. Discharge weight 80.3 kg (177 lbs), admission weight 86.274 kg (190 lbs).  If patient gains more than 2-3 pounds, after discharge to discuss with primary care physician for further care and management.  Have legs elevated when seated or laying in bed.  Labs to be checked on 08/25/2011, if creatinine is elevated, consider decreasing dose of Lasix and liberalizing patient's fluid restriction.  Hypertension Not well controlled.  Restarted patient's losartan on 08/21/2011.  Increased hydralazine.  Continue diltiazem, and metoprolol.  Further titration of antihypertensive medications to be done as outpatient.  COPD Continue neb treatment.    Leukocytosis Improved. Due to urinary tract infection.  A. fib  paroxysmal Converted to afib on 08/16/2011 and converted back to sinus rhythm on 08/17/2011.  Continue diltiazem.  Patient and family declined long-term anticoagulation, which is reasonable given patient's age and multiple comorbidities with risk for fall.  Hypothyroidism Continue Synthroid.  Coronary disease Stable.  Generalized weakness Continue physical therapy at SNF.  CODE STATUS DO NOT RESUSCITATE/DO NOT INTUBATE.  Disposition Discharge patient today.   LOS: 7 days  Corby Vandenberghe A, MD 08/21/2011, 11:05 AM

## 2011-08-21 NOTE — Progress Notes (Signed)
Inpatient Diabetes Program Recommendations  AACE/ADA: New Consensus Statement on Inpatient Glycemic Control (2009)  Target Ranges:  Prepandial:   less than 140 mg/dL      Peak postprandial:   less than 180 mg/dL (1-2 hours)      Critically ill patients:  140 - 180 mg/dL   Reason for Visit: Hyperglycemia  Results for NIQUITA, DIGIOIA (MRN [id number]) as of 08/21/2011 10:18  Ref. Range 08/15/2011 00:00  Hemoglobin A1C Latest Range: <5.7 % 7.1 (H)  Results for ADALAYA, IRION (MRN [id number]) as of 08/21/2011 10:18  Ref. Range 08/18/2011 17:17 08/20/2011 04:20 08/21/2011 09:08  Glucose Latest Range: 70-99 mg/dL 782 (H) 956 (H) 213 (H)  Results for NAKAIYA, BEDDOW (MRN [id number]) as of 08/21/2011 10:18  Ref. Range 08/20/2011 08:26 08/20/2011 12:34 08/20/2011 16:58 08/20/2011 22:28 08/21/2011 07:42  Glucose-Capillary Latest Range: 70-99 mg/dL 629 (H) 528 (H) 413 (H) 199 (H) 153 (H)    Inpatient Diabetes Program Recommendations Insulin - Basal: Increase Lantus to 45 units QHS Insulin - Meal Coverage: Needs meal coverage insulin - add Novolog 3 units tidwc  Note: Will follow.

## 2011-08-21 NOTE — Progress Notes (Signed)
Report called to [place] at [facility] at [place]. [name]

## 2011-08-21 NOTE — Discharge Summary (Addendum)
Discharge Summary  KENDELL GAMMON MR#: [id number]  DOB:11-10-1918  Date of Admission: 08/14/2011 Date of Discharge: 08/21/2011  Patient's PCP: Colon Branch, MD, MD  Attending Physician:Ediel Unangst A  Consults: None  Discharge Diagnoses: Principal Problem:  *CHF exacerbation Active Problems:  SOB (shortness of breath)  Hypertension  Hypothyroidism  CAD (coronary artery disease)  Confusion  Hyponatremia  COPD bronchitis  UTI (lower urinary tract infection)  Abdominal distention  Hyperkalemia   Brief Admitting History and Physical [name] is a 78 y.o. female has a past medical history of Dysphagia; Diabetes mellitus; Hypertension; Hypothyroidism; Hyperlipemia; GERD (gastroesophageal reflux disease); Depression; Anxiety; Acute MI; Pneumonia; Stroke; UTI (urinary tract infection); COPD (chronic obstructive pulmonary disease); CAD (coronary artery disease); Late effects of CVA (cerebrovascular accident); and DEMENTIA. Presented with confusion since last week on 08/14/2011.   Discharge Medications Medication List  As of 08/21/2011 12:01 PM   STOP taking these medications         amLODipine 10 MG tablet      potassium chloride 20 MEQ packet         TAKE these medications         acetaminophen 325 MG tablet   Commonly known as: TYLENOL   Take 650 mg by mouth every 4 (four) hours as needed. For pain.      albuterol (5 MG/ML) 0.5% nebulizer solution   Commonly known as: PROVENTIL   Take 0.5 mLs (2.5 mg total) by nebulization every 2 (two) hours as needed for wheezing or shortness of breath.      ALPRAZolam 0.25 MG tablet   Commonly known as: XANAX   Take 0.25 mg by mouth 2 (two) times daily.      aspirin 81 MG chewable tablet   Chew 81 mg by mouth daily.      beta carotene w/minerals tablet   Take 1 tablet by mouth daily.      bisacodyl 10 MG suppository   Commonly known as: DULCOLAX   Place 1 suppository (10 mg total) rectally daily as needed for  constipation.      diltiazem 180 MG 24 hr capsule   Commonly known as: CARDIZEM CD   Take 1 capsule (180 mg total) by mouth daily.      DULoxetine 30 MG capsule   Commonly known as: CYMBALTA   Take 30 mg by mouth at bedtime.      feeding supplement Liqd   Take 237 mLs by mouth 3 (three) times daily between meals.      furosemide 40 MG tablet   Commonly known as: LASIX   Take 1 tablet (40 mg total) by mouth 2 (two) times daily.      Glucose Liqd   Take 15 g by mouth as needed. If CBG < 60.      hydrALAZINE 50 MG tablet   Commonly known as: APRESOLINE   Take 1 tablet (50 mg total) by mouth every 6 (six) hours.      insulin aspart 100 UNIT/ML injection   Commonly known as: novoLOG   Inject 4 Units into the skin 3 (three) times daily with meals.      insulin glargine 100 UNIT/ML injection   Commonly known as: LANTUS   Inject 58 Units into the skin at bedtime.      ipratropium 0.02 % nebulizer solution   Commonly known as: ATROVENT   Take 0.5 mg by nebulization every 2 (two) hours as needed. For shortness of breath.  lactulose 10 GM/15ML solution   Commonly known as: CHRONULAC   Take 30 mLs (20 g total) by mouth 2 (two) times daily.      levothyroxine 100 MCG tablet   Commonly known as: SYNTHROID, LEVOTHROID   Take 1 tablet (100 mcg total) by mouth daily before breakfast.      LOPRESSOR 50 MG tablet   Generic drug: metoprolol   Take 50 mg by mouth 2 (two) times daily.      losartan 100 MG tablet   Commonly known as: COZAAR   Take 100 mg by mouth daily.      metFORMIN 500 MG tablet   Commonly known as: GLUCOPHAGE   Take 500 mg by mouth 2 (two) times daily with a meal.      omeprazole 20 MG capsule   Commonly known as: PRILOSEC   Take 20 mg by mouth 2 (two) times daily.      polyethylene glycol packet   Commonly known as: MIRALAX / GLYCOLAX   Take 17 g by mouth daily.      simvastatin 20 MG tablet   Commonly known as: ZOCOR   Take 20 mg by mouth daily.             Hospital Course: Altered mental status/Acute Delirum  Secondary to UTI/hyponatremia, per daughter at baseline.  Improved during the course of hospital stay.  Shortness of breath  Patient has COPD, which is stable at this time. Likely due to acute on chronic diastolic heart failure with exacerbation. Improved, with diuresis and neb treatments. Continue lasix.   Hyponatremia  Likely due to acute on chronic diastolic CHF. Was initially on tolvaptan for hyponatremia, which was discontinued on 08/17/2011 with improvement in sodium. Patient to have BMET checked on 08/25/2011, for sodium check.   ESBL UTI  Initially was on ceftriaxone which has been discontinued, currently on imipenem since 08/17/2011, completed 5 day course of imipenem, per ID Dr. Drue Second 5 days is adequate.   Acute diastolic congestive heart failure  Improved with diuresis. 2-D echocardiogram on 08/15/2011 showed ejection fraction 60-65%, grade 1 diastolic dysfunction, and mild mitral regurgitation. Was on IV Lasix which was transitioned to oral furosemide on 08/20/2011. Discharge weight 80.3 kg (177 lbs), admission weight 86.274 kg (190 lbs). If patient gains more than 2-3 pounds, after discharge to discuss with primary care physician for further care and management. Have legs elevated when seated or laying in bed. Labs to be checked on 08/25/2011, if creatinine is elevated, consider decreasing dose of Lasix and liberalizing patient's fluid restriction.   Hypertension  Not well controlled. Restarted patient's losartan on 08/21/2011. Increased hydralazine. Continue diltiazem, and metoprolol. Further titration of antihypertensive medications to be done as outpatient.   COPD  Continue neb treatment.   Leukocytosis  Improved. Due to urinary tract infection.   A. fib paroxysmal  Converted to afib on 08/16/2011 and converted back to sinus rhythm on 08/17/2011. Continue diltiazem. Patient and family declined long-term  anticoagulation, which is reasonable given patient's age and multiple comorbidities with risk for fall.   Hypothyroidism  Continue Synthroid.   Coronary disease  Stable.   Generalized weakness  Continue physical therapy at SNF.  Diabetes Resume home diabetic insulin regimen.  Per daughter patient likes chocolates.  Patient has not chocolates during the hospital stay.  Suspect patient's blood sugars may be higher at skilled nursing facility.  Further titration of insulin regimen as outpatient.  Day of Discharge BP 163/63  Pulse 61  Temp(Src)  97.3 F (36.3 C) (Oral)  Resp 20  Ht 5\' 4"  (1.626 m)  Wt 80.3 kg (177 lb 0.5 oz)  BMI 30.39 kg/m2  SpO2 98%  Results for orders placed during the hospital encounter of 08/14/11 (from the past 48 hour(s))  GLUCOSE, CAPILLARY     Status: Abnormal   Collection Time   08/19/11 12:16 PM      Component Value Range Comment   Glucose-Capillary 203 (*) 70 - 99 (mg/dL)   GLUCOSE, CAPILLARY     Status: Abnormal   Collection Time   08/19/11  5:02 PM      Component Value Range Comment   Glucose-Capillary 236 (*) 70 - 99 (mg/dL)   GLUCOSE, CAPILLARY     Status: Abnormal   Collection Time   08/19/11  9:05 PM      Component Value Range Comment   Glucose-Capillary 275 (*) 70 - 99 (mg/dL)   CBC     Status: Abnormal   Collection Time   08/20/11  4:20 AM      Component Value Range Comment   WBC 12.0 (*) 4.0 - 10.5 (K/uL)    RBC 3.90  3.87 - 5.11 (MIL/uL)    Hemoglobin 11.6 (*) 12.0 - 15.0 (g/dL)    HCT 11.9 (*) 14.7 - 46.0 (%)    MCV 91.8  78.0 - 100.0 (fL)    MCH 29.7  26.0 - 34.0 (pg)    MCHC 32.4  30.0 - 36.0 (g/dL)    RDW 82.9  56.2 - 13.0 (%)    Platelets 252  150 - 400 (K/uL)   BASIC METABOLIC PANEL     Status: Abnormal   Collection Time   08/20/11  4:20 AM      Component Value Range Comment   Sodium 136  135 - 145 (mEq/L)    Potassium 3.7  3.5 - 5.1 (mEq/L)    Chloride 89 (*) 96 - 112 (mEq/L)    CO2 41 (*) 19 - 32 (mEq/L)    Glucose,  Bld 143 (*) 70 - 99 (mg/dL)    BUN 14  6 - 23 (mg/dL)    Creatinine, Ser 8.65  0.50 - 1.10 (mg/dL)    Calcium 8.6  8.4 - 10.5 (mg/dL)    GFR calc non Af Amer 75 (*) >90 (mL/min)    GFR calc Af Amer 87 (*) >90 (mL/min)   GLUCOSE, CAPILLARY     Status: Abnormal   Collection Time   08/20/11  8:26 AM      Component Value Range Comment   Glucose-Capillary 149 (*) 70 - 99 (mg/dL)   GLUCOSE, CAPILLARY     Status: Abnormal   Collection Time   08/20/11 12:34 PM      Component Value Range Comment   Glucose-Capillary 212 (*) 70 - 99 (mg/dL)   GLUCOSE, CAPILLARY     Status: Abnormal   Collection Time   08/20/11  4:58 PM      Component Value Range Comment   Glucose-Capillary 338 (*) 70 - 99 (mg/dL)   GLUCOSE, CAPILLARY     Status: Abnormal   Collection Time   08/20/11 10:28 PM      Component Value Range Comment   Glucose-Capillary 199 (*) 70 - 99 (mg/dL)    Comment 1 Documented in Chart      Comment 2 Notify RN     GLUCOSE, CAPILLARY     Status: Abnormal   Collection Time   08/21/11  7:42 AM  Component Value Range Comment   Glucose-Capillary 153 (*) 70 - 99 (mg/dL)   CBC     Status: Normal   Collection Time   08/21/11  9:08 AM      Component Value Range Comment   WBC 10.0  4.0 - 10.5 (K/uL)    RBC 4.07  3.87 - 5.11 (MIL/uL)    Hemoglobin 12.1  12.0 - 15.0 (g/dL)    HCT 16.1  09.6 - 04.5 (%)    MCV 92.1  78.0 - 100.0 (fL)    MCH 29.7  26.0 - 34.0 (pg)    MCHC 32.3  30.0 - 36.0 (g/dL)    RDW 40.9  81.1 - 91.4 (%)    Platelets 273  150 - 400 (K/uL)   BASIC METABOLIC PANEL     Status: Abnormal   Collection Time   08/21/11  9:08 AM      Component Value Range Comment   Sodium 133 (*) 135 - 145 (mEq/L)    Potassium 3.6  3.5 - 5.1 (mEq/L)    Chloride 88 (*) 96 - 112 (mEq/L)    CO2 37 (*) 19 - 32 (mEq/L)    Glucose, Bld 200 (*) 70 - 99 (mg/dL)    BUN 17  6 - 23 (mg/dL)    Creatinine, Ser 7.82  0.50 - 1.10 (mg/dL)    Calcium 8.7  8.4 - 10.5 (mg/dL)    GFR calc non Af Amer 75 (*) >90  (mL/min)    GFR calc Af Amer 87 (*) >90 (mL/min)     Dg Chest 1 View  08/18/2011  *RADIOLOGY REPORT*  Clinical Data: Shortness of breath.  CHEST - 1 VIEW  Comparison: 08/14/2011  Findings: The patient has a slightly increased interstitial accentuation consistent with mild pulmonary edema.  Chronic cardiomegaly.  Pulmonary vascularity has increased.  No discrete effusions.  IMPRESSION: Increased slight pulmonary edema.  Original Report Authenticated By: Gwynn Burly, M.D.   Dg Abd 1 View  08/15/2011  *RADIOLOGY REPORT*  Clinical Data: Abdominal pain and distention.  ABDOMEN - 1 VIEW  Comparison: Two-view abdomen x-ray 06/27/2011.  Findings: Bowel gas pattern unremarkable without evidence of obstruction or significant ileus.  Very large amount of stool throughout the colon from cecum to rectum.  Calcified fibroid in the midline of the pelvis.  Degenerative changes involving the lumbar spine and both hips.  IMPRESSION: No acute abdominal abnormality.  Large stool burden.  Original Report Authenticated By: Arnell Sieving, M.D.   Ct Head Wo Contrast  08/15/2011  *RADIOLOGY REPORT*  Clinical Data: Increasing confusion. Altered mental status.  Prior stroke.  CT HEAD WITHOUT CONTRAST  Technique:  Contiguous axial images were obtained from the base of the skull through the vertex without contrast.  Comparison: CT scan dated 02/02/2011  Findings: There is no acute intracranial hemorrhage, infarction, or mass lesion.  There is an old left basal ganglia infarct extending into the left centrum semiovale.  Diffuse mild cerebral cortical and cerebellar atrophy.  No osseous abnormality.  IMPRESSION: No acute abnormalities.  Old left brain stroke.  Original Report Authenticated By: Gwynn Burly, M.D.   Dg Chest Port 1 View  08/14/2011  *RADIOLOGY REPORT*  Clinical Data: Shortness of breath, confusion and wheezing.  The  PORTABLE CHEST - 1 VIEW  Comparison: 06/26/2011  Findings: There likely is a chronic  component of mild interstitial edema and chronic lung disease.  There is no evidence of overt airspace edema or pleural fluid.  Heart  size is stable.  IMPRESSION: Chronic lung disease and probable chronic component of interstitial edema.  Original Report Authenticated By: Reola Calkins, M.D.   Disposition: SNF with PT.  Diet: Diabetic diet.  Activity: Resume as tolerated.   Follow-up Appts: Discharge Orders    Future Orders Please Complete By Expires   Diet Carb Modified      Increase activity slowly      Discharge instructions      Comments:   Followup with Colon Branch, MD (PCP) in 1 week. Please have legs elevated while seated or laying in bed. Continue physical therapy at SNF. Please check weight daily for the next week if any gain in more than 2-3 pounds then please discuss with your PCP. Please have BMET checked on 08/25/2011 and have results followed by his primary care physician.   Heart Failure patients record your daily weight using the same scale at the same time of day      ACE Inhibitor / ARB already ordered      ACE Inhibitor / ARB already ordered         TESTS THAT NEED FOLLOW-UP None  Time spent on discharge, talking to the patient, and coordinating care: 35 mins.   Signed: Suzanna Zahn A, MD 08/21/2011, 12:01 PM   Addendum: Patient is on a 1.5 L fluid restriction, depending on patient's labs to be checked 08/25/2011 consider liberalizing fluid restriction.  Dawnisha Marquina A, MD 08/22/2011, 2:36 PM

## 2011-08-21 NOTE — Progress Notes (Signed)
Foley catheter dc'd at [phone]. Pt has yet to void.  Bladder scanner result: 43ml.  Will continue to monitor for urine output. Maeola Harman

## 2011-08-21 NOTE — Progress Notes (Signed)
Physical Therapy Treatment Patient Details Name: [name] MRN: [id number] DOB: Aug 20, 1918 Today's Date: 08/21/2011 Time: 1137-1201 PT Time Calculation (min): 24 min  PT Assessment / Plan / Recommendation Comments on Treatment Session  pt pleasant and cooperative, per daughter pt hasn't walked in 2 years, but was able to transfers to [facility] independently a couple months ago, +2 total assist required for supine to sit to stand due to weakness and decreased balance    Follow Up Recommendations  Skilled nursing facility    Barriers to Discharge        Equipment Recommendations  Defer to next venue    Recommendations for Other Services    Frequency Min 3X/week   Plan Discharge plan remains appropriate    Precautions / Restrictions Precautions Precautions: Fall   Pertinent Vitals/Pain **no c/o pain*    Mobility  Bed Mobility Bed Mobility: Supine to Sit Supine to Sit: 1: +2 Total assist;HOB flat Supine to Sit: Patient Percentage: 10% Details for Bed Mobility Assistance: +2 total assist for LEs and trunk Transfers Transfers: Sit to Stand;Stand to Sit;Stand Pivot Transfers Sit to Stand: 1: +2 Total assist;From bed;With upper extremity assist Sit to Stand: Patient Percentage: 40% Stand to Sit: 1: +2 Total assist;To chair/3-in-1 Stand to Sit: Patient Percentage: 60% Stand Pivot Transfers: 1: +2 Total assist (with RW) Stand Pivot Transfers: Patient Percentage: 50% Details for Transfer Assistance: sit to stand with RW x 2 trials. Pt stood briefly with RW and was unable to initiate a step. Her daughter stated pt is fearful of falling. SPT bed to recliner with +2 total assist especially to initiate movement. VCs to correct flexed posture.  Ambulation/Gait Ambulation/Gait Assistance Details: unable Stairs: No Wheelchair Mobility Wheelchair Mobility: No    Exercises     PT Diagnosis:    PT Problem List:   PT Treatment Interventions:     PT Goals Acute Rehab PT Goals PT Goal  Formulation: With patient/family Time For Goal Achievement: 08/26/11 Potential to Achieve Goals: Fair Pt will go Supine/Side to Sit: with supervision PT Goal: Supine/Side to Sit - Progress: Not progressing Pt will go Sit to Supine/Side: with supervision Pt will go Sit to Stand: with min assist PT Goal: Sit to Stand - Progress: Not progressing Pt will go Stand to Sit: with min assist PT Goal: Stand to Sit - Progress: Not progressing Pt will Transfer Bed to Chair/Chair to Bed: with min assist PT Transfer Goal: Bed to Chair/Chair to Bed - Progress: Not progressing  Visit Information  Last PT Received On: 08/21/11 Assistance Needed: +2    Subjective Data  Subjective: mostly nods yes/no and grunts, no verbalization   Cognition  Overall Cognitive Status: History of cognitive impairments - at baseline Arousal/Alertness: Awake/alert Behavior During Session: [facility] for tasks performed    Balance  Balance Balance Assessed: Yes Static Sitting Balance Static Sitting - Balance Support: Bilateral upper extremity supported;Feet supported Static Sitting - Level of Assistance: 4: Min assist;5: Stand by assistance Static Sitting - Comment/# of Minutes: 3 min on EOB, pt initially leaned posteriorly requiring min A but with VCs was able to come to midline and hold trunk steady  End of Session PT - End of Session Equipment Utilized During Treatment: Gait belt Activity Tolerance: Patient limited by fatigue Patient left: in chair;with call bell/phone within reach;with family/visitor present Nurse Communication: Need for lift equipment    Tamala Ser 08/21/2011, 12:11 PM [phone]

## 2011-08-21 NOTE — Progress Notes (Signed)
Patient cleared for discharge. Patient accepted back to [facility]. Packet copied and placed in [place]. ptar called for transportation. Patient's family notified.  Lavaughn Haberle C. Kamya Watling MSW, LCSW [phone]

## 2011-08-30 ENCOUNTER — Encounter (HOSPITAL_COMMUNITY): Payer: Self-pay | Admitting: Emergency Medicine

## 2011-08-30 ENCOUNTER — Emergency Department (HOSPITAL_COMMUNITY): Payer: Medicare Other

## 2011-08-30 ENCOUNTER — Inpatient Hospital Stay (HOSPITAL_COMMUNITY)
Admission: EM | Admit: 2011-08-30 | Discharge: 2011-09-08 | DRG: 640 | Disposition: A | Discharge status: Skilled Nursing Facility | Payer: Medicare Other | Attending: Internal Medicine | Admitting: Internal Medicine

## 2011-08-30 DIAGNOSIS — K219 Gastro-esophageal reflux disease without esophagitis: Secondary | ICD-10-CM | POA: Diagnosis present

## 2011-08-30 DIAGNOSIS — Z794 Long term (current) use of insulin: Secondary | ICD-10-CM

## 2011-08-30 DIAGNOSIS — R140 Abdominal distension (gaseous): Secondary | ICD-10-CM

## 2011-08-30 DIAGNOSIS — R410 Disorientation, unspecified: Secondary | ICD-10-CM

## 2011-08-30 DIAGNOSIS — E876 Hypokalemia: Secondary | ICD-10-CM | POA: Diagnosis present

## 2011-08-30 DIAGNOSIS — IMO0002 Reserved for concepts with insufficient information to code with codable children: Secondary | ICD-10-CM | POA: Diagnosis not present

## 2011-08-30 DIAGNOSIS — K5900 Constipation, unspecified: Secondary | ICD-10-CM | POA: Diagnosis not present

## 2011-08-30 DIAGNOSIS — F3289 Other specified depressive episodes: Secondary | ICD-10-CM | POA: Diagnosis present

## 2011-08-30 DIAGNOSIS — R339 Retention of urine, unspecified: Secondary | ICD-10-CM | POA: Diagnosis not present

## 2011-08-30 DIAGNOSIS — E875 Hyperkalemia: Secondary | ICD-10-CM

## 2011-08-30 DIAGNOSIS — F0390 Unspecified dementia without behavioral disturbance: Secondary | ICD-10-CM | POA: Diagnosis present

## 2011-08-30 DIAGNOSIS — F411 Generalized anxiety disorder: Secondary | ICD-10-CM | POA: Diagnosis present

## 2011-08-30 DIAGNOSIS — J4489 Other specified chronic obstructive pulmonary disease: Secondary | ICD-10-CM | POA: Diagnosis present

## 2011-08-30 DIAGNOSIS — I10 Essential (primary) hypertension: Secondary | ICD-10-CM

## 2011-08-30 DIAGNOSIS — J90 Pleural effusion, not elsewhere classified: Secondary | ICD-10-CM

## 2011-08-30 DIAGNOSIS — I252 Old myocardial infarction: Secondary | ICD-10-CM

## 2011-08-30 DIAGNOSIS — E162 Hypoglycemia, unspecified: Secondary | ICD-10-CM

## 2011-08-30 DIAGNOSIS — Z7982 Long term (current) use of aspirin: Secondary | ICD-10-CM

## 2011-08-30 DIAGNOSIS — D649 Anemia, unspecified: Secondary | ICD-10-CM | POA: Diagnosis present

## 2011-08-30 DIAGNOSIS — R4182 Altered mental status, unspecified: Secondary | ICD-10-CM

## 2011-08-30 DIAGNOSIS — R1310 Dysphagia, unspecified: Secondary | ICD-10-CM | POA: Diagnosis present

## 2011-08-30 DIAGNOSIS — F329 Major depressive disorder, single episode, unspecified: Secondary | ICD-10-CM | POA: Diagnosis present

## 2011-08-30 DIAGNOSIS — E785 Hyperlipidemia, unspecified: Secondary | ICD-10-CM

## 2011-08-30 DIAGNOSIS — G9341 Metabolic encephalopathy: Secondary | ICD-10-CM

## 2011-08-30 DIAGNOSIS — E871 Hypo-osmolality and hyponatremia: Principal | ICD-10-CM

## 2011-08-30 DIAGNOSIS — Z66 Do not resuscitate: Secondary | ICD-10-CM | POA: Diagnosis present

## 2011-08-30 DIAGNOSIS — Z7401 Bed confinement status: Secondary | ICD-10-CM

## 2011-08-30 DIAGNOSIS — I5033 Acute on chronic diastolic (congestive) heart failure: Secondary | ICD-10-CM

## 2011-08-30 DIAGNOSIS — Z8673 Personal history of transient ischemic attack (TIA), and cerebral infarction without residual deficits: Secondary | ICD-10-CM

## 2011-08-30 DIAGNOSIS — R109 Unspecified abdominal pain: Secondary | ICD-10-CM

## 2011-08-30 DIAGNOSIS — I509 Heart failure, unspecified: Secondary | ICD-10-CM | POA: Diagnosis present

## 2011-08-30 DIAGNOSIS — R509 Fever, unspecified: Secondary | ICD-10-CM

## 2011-08-30 DIAGNOSIS — I4891 Unspecified atrial fibrillation: Secondary | ICD-10-CM | POA: Diagnosis present

## 2011-08-30 DIAGNOSIS — Z79899 Other long term (current) drug therapy: Secondary | ICD-10-CM

## 2011-08-30 DIAGNOSIS — E222 Syndrome of inappropriate secretion of antidiuretic hormone: Secondary | ICD-10-CM | POA: Diagnosis present

## 2011-08-30 DIAGNOSIS — J449 Chronic obstructive pulmonary disease, unspecified: Secondary | ICD-10-CM

## 2011-08-30 DIAGNOSIS — IMO0001 Reserved for inherently not codable concepts without codable children: Secondary | ICD-10-CM

## 2011-08-30 DIAGNOSIS — E1169 Type 2 diabetes mellitus with other specified complication: Secondary | ICD-10-CM

## 2011-08-30 DIAGNOSIS — I5032 Chronic diastolic (congestive) heart failure: Secondary | ICD-10-CM | POA: Diagnosis present

## 2011-08-30 DIAGNOSIS — Z8679 Personal history of other diseases of the circulatory system: Secondary | ICD-10-CM

## 2011-08-30 DIAGNOSIS — I2510 Atherosclerotic heart disease of native coronary artery without angina pectoris: Secondary | ICD-10-CM

## 2011-08-30 DIAGNOSIS — R0602 Shortness of breath: Secondary | ICD-10-CM

## 2011-08-30 DIAGNOSIS — I6990 Unspecified sequelae of unspecified cerebrovascular disease: Secondary | ICD-10-CM | POA: Diagnosis present

## 2011-08-30 DIAGNOSIS — E039 Hypothyroidism, unspecified: Secondary | ICD-10-CM

## 2011-08-30 DIAGNOSIS — N390 Urinary tract infection, site not specified: Secondary | ICD-10-CM

## 2011-08-30 DIAGNOSIS — E119 Type 2 diabetes mellitus without complications: Secondary | ICD-10-CM

## 2011-08-30 LAB — URINALYSIS, ROUTINE W REFLEX MICROSCOPIC
Bilirubin Urine: NEGATIVE
Hgb urine dipstick: NEGATIVE
Ketones, ur: NEGATIVE mg/dL
Nitrite: NEGATIVE
Protein, ur: NEGATIVE mg/dL
Urobilinogen, UA: 0.2 mg/dL (ref 0.0–1.0)

## 2011-08-30 LAB — DIFFERENTIAL
Basophils Relative: 0 % (ref 0–1)
Eosinophils Absolute: 0.3 10*3/uL (ref 0.0–0.7)
Eosinophils Relative: 3 % (ref 0–5)
Neutrophils Relative %: 73 % (ref 43–77)

## 2011-08-30 LAB — COMPREHENSIVE METABOLIC PANEL
ALT: 19 U/L (ref 0–35)
Albumin: 3.5 g/dL (ref 3.5–5.2)
Alkaline Phosphatase: 111 U/L (ref 39–117)
BUN: 8 mg/dL (ref 6–23)
Calcium: 9.3 mg/dL (ref 8.4–10.5)
GFR calc Af Amer: 88 mL/min — ABNORMAL LOW (ref >90)
Glucose, Bld: 107 mg/dL — ABNORMAL HIGH (ref 70–99)
Potassium: 4 mEq/L (ref 3.5–5.1)
Sodium: 126 mEq/L — ABNORMAL LOW (ref 135–145)
Total Protein: 6.8 g/dL (ref 6.0–8.3)

## 2011-08-30 LAB — CBC
MCH: 29.5 pg (ref 26.0–34.0)
MCHC: 33.1 g/dL (ref 30.0–36.0)
MCV: 88.9 fL (ref 78.0–100.0)
Platelets: 237 10*3/uL (ref 150–400)

## 2011-08-30 LAB — CARDIAC PANEL(CRET KIN+CKTOT+MB+TROPI)
Relative Index: INVALID (ref 0.0–2.5)
Troponin I: <0.3 ng/mL (ref <0.30)

## 2011-08-30 LAB — BLOOD GAS, ARTERIAL
Acid-Base Excess: 11.6 mmol/L — ABNORMAL HIGH (ref 0.0–2.0)
Bicarbonate: 37.4 mEq/L — ABNORMAL HIGH (ref 20.0–24.0)
O2 Saturation: 99.6 %
Patient temperature: 98.6
TCO2: 39.5 mmol/L (ref 0–100)

## 2011-08-30 LAB — TROPONIN I: Troponin I: <0.3 ng/mL (ref <0.30)

## 2011-08-30 LAB — GLUCOSE, CAPILLARY: Glucose-Capillary: 284 mg/dL — ABNORMAL HIGH (ref 70–99)

## 2011-08-30 MED ORDER — SODIUM CHLORIDE 0.9 % IV SOLN: 250.0000 mL | INTRAVENOUS | Status: DC | PRN

## 2011-08-30 MED ORDER — SODIUM CHLORIDE 0.9 % IJ SOLN
3.0000 mL | Freq: Two times a day (BID) | INTRAMUSCULAR | Status: DC
  Administered 2011-08-31 – 2011-09-07 (×8): 3 mL via INTRAVENOUS

## 2011-08-30 MED ORDER — DEXTROSE 50 % IV SOLN
INTRAVENOUS | Status: AC
  Administered 2011-08-30: 50 mL
  Filled 2011-08-30: qty 50

## 2011-08-30 MED ORDER — ATORVASTATIN CALCIUM 10 MG PO TABS
10.0000 mg | ORAL_TABLET | Freq: Every day | ORAL | Status: DC
  Administered 2011-08-31 – 2011-09-07 (×8): 10 mg via ORAL
  Filled 2011-08-30 (×10): qty 1

## 2011-08-30 MED ORDER — SIMVASTATIN 20 MG PO TABS
20.0000 mg | ORAL_TABLET | Freq: Every day | ORAL | Status: DC
  Filled 2011-08-30: qty 1

## 2011-08-30 MED ORDER — ENSURE COMPLETE PO LIQD
237.0000 mL | Freq: Three times a day (TID) | ORAL | Status: DC
  Administered 2011-08-31 – 2011-09-08 (×16): 237 mL via ORAL

## 2011-08-30 MED ORDER — METOPROLOL TARTRATE 50 MG PO TABS
50.0000 mg | ORAL_TABLET | Freq: Two times a day (BID) | ORAL | Status: DC
  Administered 2011-08-31 – 2011-09-08 (×17): 50 mg via ORAL
  Filled 2011-08-30 (×19): qty 1

## 2011-08-30 MED ORDER — HYDRALAZINE HCL 20 MG/ML IJ SOLN
10.0000 mg | INTRAMUSCULAR | Status: DC | PRN
  Administered 2011-08-31: 10 mg via INTRAVENOUS
  Filled 2011-08-30: qty 0.5
  Filled 2011-08-30: qty 1

## 2011-08-30 MED ORDER — LOSARTAN POTASSIUM 50 MG PO TABS
100.0000 mg | ORAL_TABLET | Freq: Every day | ORAL | Status: DC
  Administered 2011-08-31 – 2011-09-04 (×5): 100 mg via ORAL
  Filled 2011-08-30 (×6): qty 2

## 2011-08-30 MED ORDER — ASPIRIN 81 MG PO CHEW
81.0000 mg | CHEWABLE_TABLET | Freq: Every day | ORAL | Status: DC
  Administered 2011-08-31 – 2011-09-08 (×9): 81 mg via ORAL
  Filled 2011-08-30 (×10): qty 1

## 2011-08-30 MED ORDER — FUROSEMIDE 10 MG/ML IJ SOLN
40.0000 mg | Freq: Two times a day (BID) | INTRAMUSCULAR | Status: DC
  Administered 2011-08-30 – 2011-09-01 (×5): 40 mg via INTRAVENOUS
  Filled 2011-08-30 (×7): qty 4

## 2011-08-30 MED ORDER — SODIUM CHLORIDE 0.9 % IJ SOLN: 3.0000 mL | INTRAMUSCULAR | Status: DC | PRN

## 2011-08-30 MED ORDER — SODIUM CHLORIDE 0.9 % IJ SOLN
3.0000 mL | Freq: Two times a day (BID) | INTRAMUSCULAR | Status: DC
  Administered 2011-08-31 – 2011-09-08 (×10): 3 mL via INTRAVENOUS

## 2011-08-30 MED ORDER — DILTIAZEM HCL ER COATED BEADS 180 MG PO CP24
180.0000 mg | ORAL_CAPSULE | Freq: Every day | ORAL | Status: DC
  Administered 2011-08-31 – 2011-09-08 (×9): 180 mg via ORAL
  Filled 2011-08-30 (×10): qty 1

## 2011-08-30 MED ORDER — HYDRALAZINE HCL 50 MG PO TABS
50.0000 mg | ORAL_TABLET | Freq: Four times a day (QID) | ORAL | Status: DC
  Administered 2011-08-31 – 2011-09-04 (×16): 50 mg via ORAL
  Filled 2011-08-30 (×22): qty 1

## 2011-08-30 MED ORDER — ONDANSETRON HCL 4 MG/2ML IJ SOLN: 4.0000 mg | Freq: Three times a day (TID) | INTRAMUSCULAR | Status: DC | PRN

## 2011-08-30 MED ORDER — DULOXETINE HCL 30 MG PO CPEP
30.0000 mg | ORAL_CAPSULE | Freq: Every day | ORAL | Status: DC
  Administered 2011-08-31: 30 mg via ORAL
  Filled 2011-08-30 (×3): qty 1

## 2011-08-30 MED ORDER — LEVOTHYROXINE SODIUM 100 MCG PO TABS
100.0000 ug | ORAL_TABLET | Freq: Every day | ORAL | Status: DC
  Administered 2011-08-31 – 2011-09-08 (×9): 100 ug via ORAL
  Filled 2011-08-30 (×12): qty 1

## 2011-08-30 NOTE — ED Notes (Signed)
Patient arrived by EMS from [facility].  Family and nursing staff advised patient was not responding correctly today.   Blood Sugar off today, after checking BS=79.  Aroused patient , she was talking and oriented.  Patient questioned why blood work could not be done there instead of bringing her to [facility] hospital.  Tried IV, veins could not tolerate.  SR on monitor with PVCs.  Usually on 2L O2 [place].

## 2011-08-30 NOTE — H&P (Signed)
PCP:   Colon Branch, MD, MD   Chief Complaint:  Altered mental status  HPI: 78 year old female with a history of previous CVA, insulin-dependent diabetes, dysphagia, dementia his bed bound at baseline presents emergency department because of altered mental status. She was in a similar episode several weeks ago. She's been having episodes of hypoglycemia and this morning her sugar was less than 60. Chest is recently diagnosed with a UTI and treated with antibiotics. Her daughters say that she has not been running fevers. She's not had any nausea vomiting or diarrhea. She has a history of congestive heart failure also however they did not think that she's been short of breath or having any difficulty breathing. She seems to be overly sedated him. She was discharged on Xanax twice a day and was only supposed to be as needed however they think she's been getting it scheduled twice a day every day. Her lower extremity edema has improved since last week according to the family. They have not noticed any focal neurologic deficits. She has had poor by mouth intake for the last 24-48 hours.  Review of Systems:  Otherwise unobtainable from the patient  Past Medical History: Past Medical History  Diagnosis Date  . Dysphagia   . Diabetes mellitus   . Hypertension   . Hypothyroidism   . Hyperlipemia   . GERD (gastroesophageal reflux disease)   . Depression   . Anxiety   . Acute MI   . Pneumonia   . Stroke   . UTI (urinary tract infection)   . COPD (chronic obstructive pulmonary disease)   . CAD (coronary artery disease)   . Late effects of CVA (cerebrovascular accident)   . DEMENTIA    Past Surgical History  Procedure Date  . Back surgery     Medications: Prior to Admission medications   Medication Sig Start Date End Date Taking? Authorizing Provider  acetaminophen (TYLENOL) 325 MG tablet Take 650 mg by mouth every 4 (four) hours as needed. For pain.    Historical Provider, MD    albuterol (PROVENTIL) (5 MG/ML) 0.5% nebulizer solution Take 0.5 mLs (2.5 mg total) by nebulization every 2 (two) hours as needed for wheezing or shortness of breath. 06/26/11 06/25/12  Joseph Art, DO  ALPRAZolam (XANAX) 0.25 MG tablet Take 0.25 mg by mouth 2 (two) times daily.      Historical Provider, MD  aspirin 81 MG chewable tablet Chew 81 mg by mouth daily.    Historical Provider, MD  beta carotene w/minerals (OCUVITE) tablet Take 1 tablet by mouth daily.      Historical Provider, MD  bisacodyl (DULCOLAX) 10 MG suppository Place 1 suppository (10 mg total) rectally daily as needed for constipation. 08/21/11 08/31/11  Srikar Cherlynn Kaiser, MD  diltiazem (CARDIZEM CD) 180 MG 24 hr capsule Take 1 capsule (180 mg total) by mouth daily. 08/21/11 08/20/12  Cristal Ford, MD  DULoxetine (CYMBALTA) 30 MG capsule Take 30 mg by mouth at bedtime.      Historical Provider, MD  feeding supplement (ENSURE COMPLETE) LIQD Take 237 mLs by mouth 3 (three) times daily between meals. 08/21/11   Srikar Cherlynn Kaiser, MD  furosemide (LASIX) 40 MG tablet Take 1 tablet (40 mg total) by mouth 2 (two) times daily. 08/21/11 08/20/12  Cristal Ford, MD  Glucose LIQD Take 15 g by mouth as needed. If CBG < 60.    Historical Provider, MD  hydrALAZINE (APRESOLINE) 50 MG tablet Take 1 tablet (50 mg total) by  mouth every 6 (six) hours. 08/21/11 08/20/12  Cristal Ford, MD  insulin aspart (NOVOLOG) 100 UNIT/ML injection Inject 4 Units into the skin 3 (three) times daily with meals.     Historical Provider, MD  insulin glargine (LANTUS) 100 UNIT/ML injection Inject 58 Units into the skin at bedtime. 06/28/11 06/27/12  Joseph Art, DO  ipratropium (ATROVENT) 0.02 % nebulizer solution Take 0.5 mg by nebulization every 2 (two) hours as needed. For shortness of breath. 06/26/11 06/25/12  Joseph Art, DO  lactulose (CHRONULAC) 10 GM/15ML solution Take 30 mLs (20 g total) by mouth 2 (two) times daily. 08/21/11 08/31/11  Srikar Cherlynn Kaiser, MD  levothyroxine  (SYNTHROID, LEVOTHROID) 100 MCG tablet Take 1 tablet (100 mcg total) by mouth daily before breakfast. 06/26/11 06/25/12  Joseph Art, DO  losartan (COZAAR) 100 MG tablet Take 100 mg by mouth daily.      Historical Provider, MD  metFORMIN (GLUCOPHAGE) 500 MG tablet Take 500 mg by mouth 2 (two) times daily with a meal.      Historical Provider, MD  metoprolol (LOPRESSOR) 50 MG tablet Take 50 mg by mouth 2 (two) times daily.      Historical Provider, MD  omeprazole (PRILOSEC) 20 MG capsule Take 20 mg by mouth 2 (two) times daily.      Historical Provider, MD  polyethylene glycol (MIRALAX / GLYCOLAX) packet Take 17 g by mouth daily.      Historical Provider, MD  simvastatin (ZOCOR) 20 MG tablet Take 20 mg by mouth daily.      Historical Provider, MD    Allergies:  No Known Allergies  Social History:  reports that she has never smoked. She has never used smokeless tobacco. She reports that she does not drink alcohol or use illicit drugs.  Family History: Family History  Problem Relation Age of Onset  . Hypertension Sister   . Hypertension Brother     Physical Exam: Filed Vitals:   08/30/11 1648 08/30/11 1939  BP: 192/74 153/47  Pulse: 61   Temp: 97 F (36.1 C)   TempSrc: Oral   Resp: 16 17  SpO2: 99% 100%   General appearance: no distress and sedated opens eyes to voice follows simple commands Neck: no JVD and supple, symmetrical, trachea midline Lungs: clear to auscultation bilaterally Heart: regular rate and rhythm, S1, S2 normal, no murmur, click, rub or gallop Abdomen: soft, non-tender; bowel sounds normal; no masses,  no organomegaly Extremities: extremities normal, atraumatic, no cyanosis and edema trace Pulses: 2+ and symmetric Skin: Skin color, texture, turgor normal. No rashes or lesions Neurologic: no focal def  Labs on Admission:   [facility] 08/30/11 1719  NA 126*  K 4.0  CL 84*  CO2 35*  GLUCOSE 107*  BUN 8  CREATININE 0.65  CALCIUM 9.3  MG --  PHOS --     Basename 08/30/11 1719  AST 14  ALT 19  ALKPHOS 111  BILITOT 0.4  PROT 6.8  ALBUMIN 3.5    Basename 08/30/11 1719  WBC 9.9  NEUTROABS 7.2  HGB 11.4*  HCT 34.4*  MCV 88.9  PLT 237    Basename 08/30/11 1720  CKTOTAL --  CKMB --  CKMBINDEX --  TROPONINI <0.30    Radiological Exams on Admission: Dg Chest 1 View  08/18/2011  *RADIOLOGY REPORT*  Clinical Data: Shortness of breath.  CHEST - 1 VIEW  Comparison: 08/14/2011  Findings: The patient has a slightly increased interstitial accentuation consistent with mild pulmonary  edema.  Chronic cardiomegaly.  Pulmonary vascularity has increased.  No discrete effusions.  IMPRESSION: Increased slight pulmonary edema.  Original Report Authenticated By: Gwynn Burly, M.D.   Dg Abd 1 View  08/15/2011  *RADIOLOGY REPORT*  Clinical Data: Abdominal pain and distention.  ABDOMEN - 1 VIEW  Comparison: Two-view abdomen x-ray 06/27/2011.  Findings: Bowel gas pattern unremarkable without evidence of obstruction or significant ileus.  Very large amount of stool throughout the colon from cecum to rectum.  Calcified fibroid in the midline of the pelvis.  Degenerative changes involving the lumbar spine and both hips.  IMPRESSION: No acute abdominal abnormality.  Large stool burden.  Original Report Authenticated By: Arnell Sieving, M.D.   Ct Head Wo Contrast  08/30/2011  *RADIOLOGY REPORT*  Clinical Data: Altered mental status  CT HEAD WITHOUT CONTRAST  Technique:  Contiguous axial images were obtained from the base of the skull through the vertex without contrast.  Comparison: CT 08/15/2011 and 02/02/2011  Findings: Encephalomalacia in the left basal ganglia, extending into the left centrum semiovale  is compatible with prior infarction stable in appearance.  The ventricles are within normal limits.  Negative for hydrocephalus, hemorrhage, mass effect, mass lesion, or evidence of acute infarction. Heavy intracranial atherosclerotic calcification  noted.  The visualized paranasal sinuses, mastoid air cells, and middle ears are clear.  The skull is intact.  Soft tissues of the scalp are symmetric.  IMPRESSION:  1.  No acute intracranial abnormality. 2.  Remote infarction in the left basal ganglia and left centrum semiovale.  Original Report Authenticated By: Britta Mccreedy, M.D.   Ct Head Wo Contrast  08/15/2011  *RADIOLOGY REPORT*  Clinical Data: Increasing confusion. Altered mental status.  Prior stroke.  CT HEAD WITHOUT CONTRAST  Technique:  Contiguous axial images were obtained from the base of the skull through the vertex without contrast.  Comparison: CT scan dated 02/02/2011  Findings: There is no acute intracranial hemorrhage, infarction, or mass lesion.  There is an old left basal ganglia infarct extending into the left centrum semiovale.  Diffuse mild cerebral cortical and cerebellar atrophy.  No osseous abnormality.  IMPRESSION: No acute abnormalities.  Old left brain stroke.  Original Report Authenticated By: Gwynn Burly, M.D.   Dg Chest Port 1 View  08/30/2011  *RADIOLOGY REPORT*  Clinical Data: Altered mental status, wheezing  PORTABLE CHEST - 1 VIEW  Comparison: 08/18/2011  Findings: Cardiomediastinal silhouette is stable.  Mild interstitial prominence bilaterally without convincing pulmonary edema.  No focal infiltrate.  IMPRESSION: Mild interstitial prominence bilaterally without convincing pulmonary edema.  No focal infiltrate.  Original Report Authenticated By: Natasha Mead, M.D.   Dg Chest Port 1 View  08/14/2011  *RADIOLOGY REPORT*  Clinical Data: Shortness of breath, confusion and wheezing.  The  PORTABLE CHEST - 1 VIEW  Comparison: 06/26/2011  Findings: There likely is a chronic component of mild interstitial edema and chronic lung disease.  There is no evidence of overt airspace edema or pleural fluid.  Heart size is stable.  IMPRESSION: Chronic lung disease and probable chronic component of interstitial edema.  Original Report  Authenticated By: Reola Calkins, M.D.    Assessment/Plan Present on Admission:  78 year old female with metabolic encephalopathy/sedation from unclear etiology  .Metabolic encephalopathy .CAD (coronary artery disease) .COPD (chronic obstructive pulmonary disease) .Hypertension .Hypoglycemia .Diastolic CHF, chronic .Late effects of CVA (cerebrovascular accident) .DM (diabetes mellitus)  She did have some hypoglycemia this morning which has since resolved. There is no obvious source of  infection chest x-ray is negative urinalysis negative and she is afebrile. I wonder if  Xanax that's causing her to be sedated. Hold Xanax at this time. We'll hold her insulin. And other diabetic medications. We'll check her sugars every 4 hours. CT of her head is negative. We'll obtain frequent neurological checks. If she does not become less somnolent off her Xanax and monitoring her glucose would consider moving forward with an MRI next 24-48 hours. Monitor closely for any fever. Patient is DO NOT RESUSCITATE. Her oxygen saturation her vital signs are also normal.  Telemetry. Family is agreeable with the plan.   Tawanda Schall [phone] 08/30/2011, 8:02 PM

## 2011-08-30 NOTE — ED Notes (Signed)
Received report from [facility]. No respiratory or cardiac distress. She is arousable. Family at bedside. Will continue to monitor.

## 2011-08-30 NOTE — ED Provider Notes (Signed)
5:54 PM  Date: 08/30/2011  Rate: 64  Rhythm: normal sinus rhythm and premature ventricular contractions (PVC)  QRS Axis: left  Intervals: normal QRS:  Left ventricular hypertrophy  ST/T Wave abnormalities: normal  Conduction Disutrbances:  Incomplete left bundle branch block.   Narrative Interpretation: Abnormal EKG  Old EKG Reviewed: unchanged    Carleene Cooper III, MD 08/30/11 [id number]

## 2011-08-30 NOTE — Progress Notes (Signed)
5:05 PM Tests that we have so far reviewed with the patient. His chest x-ray showed mild cardiomegaly. His CBC chemistries first troponin high, and d-dimer were all negative. We are waiting for history results and for a 3 hour troponin test. His monitor continues to show a bigeminal rhythm. He is resting comfortably without pain.

## 2011-08-30 NOTE — ED Provider Notes (Signed)
History     CSN: [id number]  Arrival date & time 08/30/11  1646   First MD Initiated Contact with Patient 08/30/11 1650      Chief Complaint  Patient presents with  . Altered Mental Status    (Consider location/radiation/quality/duration/timing/severity/associated sxs/prior treatment) HPI  78 year old female with multiple comorbidities including dementia, CVA, and dysphasia presents for further evaluations of altered mental status. Per EMS, patient lives at [facility]. Per family and nursing staff, patient appears to behaves different than her usual baseline.  Patient is sleeping more the usual, and is less communicative. Pt has hx of CHF and has been taking Lasix.  Family members sts pt has been gain fluid weight (as documented daily) since she was discharged from hospital last week.   Nurses have noticed that her blood sugar is in the 60s this a.m. States she was arousable but side to bring patient to the ED for further evaluation. Some history were able to obtain the patient.  Patient denies headache, chest pain, shortness of breath, abdominal pain, urinary symptoms.  Pt is a poor historian, history was limited due to AMS.   Past Medical History  Diagnosis Date  . Dysphagia   . Diabetes mellitus   . Hypertension   . Hypothyroidism   . Hyperlipemia   . GERD (gastroesophageal reflux disease)   . Depression   . Anxiety   . Acute MI   . Pneumonia   . Stroke   . UTI (urinary tract infection)   . COPD (chronic obstructive pulmonary disease)   . CAD (coronary artery disease)   . Late effects of CVA (cerebrovascular accident)   . DEMENTIA     Past Surgical History  Procedure Date  . Back surgery     Family History  Problem Relation Age of Onset  . Hypertension Sister   . Hypertension Brother     History  Substance Use Topics  . Smoking status: Never Smoker   . Smokeless tobacco: Never Used  . Alcohol Use: No    OB History    Grav Para Term Preterm Abortions TAB  SAB Ect Mult Living                  Review of Systems  Unable to perform ROS: Mental status change    Allergies  Review of patient's allergies indicates no known allergies.  Home Medications   Current Outpatient Rx  Name Route Sig Dispense Refill  . ACETAMINOPHEN 325 MG PO TABS Oral Take 650 mg by mouth every 4 (four) hours as needed. For pain.    . ALBUTEROL SULFATE (5 MG/ML) 0.5% IN NEBU Nebulization Take 0.5 mLs (2.5 mg total) by nebulization every 2 (two) hours as needed for wheezing or shortness of breath. 20 mL   . ALPRAZOLAM 0.25 MG PO TABS Oral Take 0.25 mg by mouth 2 (two) times daily.      . ASPIRIN 81 MG PO CHEW Oral Chew 81 mg by mouth daily.    . OCUVITE PO TABS Oral Take 1 tablet by mouth daily.      Marland Kitchen BISACODYL 10 MG RE SUPP Rectal Place 1 suppository (10 mg total) rectally daily as needed for constipation. 12 suppository   . DILTIAZEM HCL ER COATED BEADS 180 MG PO CP24 Oral Take 1 capsule (180 mg total) by mouth daily.    . DULOXETINE HCL 30 MG PO CPEP Oral Take 30 mg by mouth at bedtime.      Nolon Nations COMPLETE  PO LIQD Oral Take 237 mLs by mouth 3 (three) times daily between meals.    . FUROSEMIDE 40 MG PO TABS Oral Take 1 tablet (40 mg total) by mouth 2 (two) times daily. 30 tablet   . GLUCOSE PO LIQD Oral Take 15 g by mouth as needed. If CBG < 60.    . HYDRALAZINE HCL 50 MG PO TABS Oral Take 1 tablet (50 mg total) by mouth every 6 (six) hours.    . INSULIN ASPART 100 UNIT/ML [place] SOLN Subcutaneous Inject 4 Units into the skin 3 (three) times daily with meals.     . INSULIN GLARGINE 100 UNIT/ML [place] SOLN Subcutaneous Inject 58 Units into the skin at bedtime.    . IPRATROPIUM BROMIDE 0.02 % IN SOLN Nebulization Take 0.5 mg by nebulization every 2 (two) hours as needed. For shortness of breath.    Marland Kitchen LACTULOSE 10 GM/15ML PO SOLN Oral Take 30 mLs (20 g total) by mouth 2 (two) times daily. 240 mL   . LEVOTHYROXINE SODIUM 100 MCG PO TABS Oral Take 1 tablet (100 mcg total) by  mouth daily before breakfast.    . LOSARTAN POTASSIUM 100 MG PO TABS Oral Take 100 mg by mouth daily.      Marland Kitchen METFORMIN HCL 500 MG PO TABS Oral Take 500 mg by mouth 2 (two) times daily with a meal.      . METOPROLOL TARTRATE 50 MG PO TABS Oral Take 50 mg by mouth 2 (two) times daily.      Marland Kitchen OMEPRAZOLE 20 MG PO CPDR Oral Take 20 mg by mouth 2 (two) times daily.      Marland Kitchen POLYETHYLENE GLYCOL 3350 PO PACK Oral Take 17 g by mouth daily.      Marland Kitchen SIMVASTATIN 20 MG PO TABS Oral Take 20 mg by mouth daily.        BP 192/74  Pulse 61  Temp(Src) 97 F (36.1 C) (Oral)  Resp 16  SpO2 99%  Physical Exam  Nursing note and vitals reviewed. Constitutional: She appears well-developed and well-nourished. No distress.  HENT:  Head: Normocephalic and atraumatic.  Mouth/Throat: Oropharynx is clear and moist. No oropharyngeal exudate.  Eyes: Conjunctivae and EOM are normal. Pupils are equal, round, and reactive to light. No scleral icterus.  Neck: Normal range of motion. Neck supple. No tracheal deviation present.  Cardiovascular: Normal rate and regular rhythm.        Difficult to assess due to body habitus, but no obvious M/R/G.  Pulmonary/Chest:       Difficult to fully assessed, but mild rales heard at base of lung.  No wheezing, no rhonchi. NO accessory muscle use  Abdominal: Soft. There is no tenderness.  Genitourinary:       Urinary and bowel incontinence.  Wearing adult diaper.  No pressure ulcers noted  Musculoskeletal: She exhibits edema.       2+ edema bilaterally with normal cap refills to toes. Pedal pulses palpable.   Neurological:       A&O to name and location, wrong year.  Skin: Skin is warm. No rash noted.    ED Course  Procedures (including critical care time)  Labs Reviewed - No data to display No results found.   No diagnosis found.  Results for orders placed during the hospital encounter of 08/30/11  URINALYSIS, ROUTINE W REFLEX MICROSCOPIC      Component Value Range    Color, Urine YELLOW  YELLOW    APPearance CLEAR  CLEAR  Specific Gravity, Urine 1.007  1.005 - 1.030    pH 7.0  5.0 - 8.0    Glucose, UA NEGATIVE  NEGATIVE (mg/dL)   Hgb urine dipstick NEGATIVE  NEGATIVE    Bilirubin Urine NEGATIVE  NEGATIVE    Ketones, ur NEGATIVE  NEGATIVE (mg/dL)   Protein, ur NEGATIVE  NEGATIVE (mg/dL)   Urobilinogen, UA 0.2  0.0 - 1.0 (mg/dL)   Nitrite NEGATIVE  NEGATIVE    Leukocytes, UA NEGATIVE  NEGATIVE   CBC      Component Value Range   WBC 9.9  4.0 - 10.5 (K/uL)   RBC 3.87  3.87 - 5.11 (MIL/uL)   Hemoglobin 11.4 (*) 12.0 - 15.0 (g/dL)   HCT 45.4 (*) 09.8 - 46.0 (%)   MCV 88.9  78.0 - 100.0 (fL)   MCH 29.5  26.0 - 34.0 (pg)   MCHC 33.1  30.0 - 36.0 (g/dL)   RDW 11.9  14.7 - 82.9 (%)   Platelets 237  150 - 400 (K/uL)  DIFFERENTIAL      Component Value Range   Neutrophils Relative 73  43 - 77 (%)   Neutro Abs 7.2  1.7 - 7.7 (K/uL)   Lymphocytes Relative 17  12 - 46 (%)   Lymphs Abs 1.7  0.7 - 4.0 (K/uL)   Monocytes Relative 7  3 - 12 (%)   Monocytes Absolute 0.7  0.1 - 1.0 (K/uL)   Eosinophils Relative 3  0 - 5 (%)   Eosinophils Absolute 0.3  0.0 - 0.7 (K/uL)   Basophils Relative 0  0 - 1 (%)   Basophils Absolute 0.0  0.0 - 0.1 (K/uL)  COMPREHENSIVE METABOLIC PANEL      Component Value Range   Sodium 126 (*) 135 - 145 (mEq/L)   Potassium 4.0  3.5 - 5.1 (mEq/L)   Chloride 84 (*) 96 - 112 (mEq/L)   CO2 35 (*) 19 - 32 (mEq/L)   Glucose, Bld 107 (*) 70 - 99 (mg/dL)   BUN 8  6 - 23 (mg/dL)   Creatinine, Ser 5.62  0.50 - 1.10 (mg/dL)   Calcium 9.3  8.4 - 13.0 (mg/dL)   Total Protein 6.8  6.0 - 8.3 (g/dL)   Albumin 3.5  3.5 - 5.2 (g/dL)   AST 14  0 - 37 (U/L)   ALT 19  0 - 35 (U/L)   Alkaline Phosphatase 111  39 - 117 (U/L)   Total Bilirubin 0.4  0.3 - 1.2 (mg/dL)   GFR calc non Af Amer 76 (*) >90 (mL/min)   GFR calc Af Amer 88 (*) >90 (mL/min)  TROPONIN I      Component Value Range   Troponin I <0.30  <0.30 (ng/mL)  PRO B NATRIURETIC  PEPTIDE      Component Value Range   Pro B Natriuretic peptide (BNP) 3591.0 (*) 0 - 450 (pg/mL)   Dg Chest 1 View  08/18/2011  *RADIOLOGY REPORT*  Clinical Data: Shortness of breath.  CHEST - 1 VIEW  Comparison: 08/14/2011  Findings: The patient has a slightly increased interstitial accentuation consistent with mild pulmonary edema.  Chronic cardiomegaly.  Pulmonary vascularity has increased.  No discrete effusions.  IMPRESSION: Increased slight pulmonary edema.  Original Report Authenticated By: Gwynn Burly, M.D.   Dg Abd 1 View  08/15/2011  *RADIOLOGY REPORT*  Clinical Data: Abdominal pain and distention.  ABDOMEN - 1 VIEW  Comparison: Two-view abdomen x-ray 06/27/2011.  Findings: Bowel gas pattern unremarkable without evidence of  obstruction or significant ileus.  Very large amount of stool throughout the colon from cecum to rectum.  Calcified fibroid in the midline of the pelvis.  Degenerative changes involving the lumbar spine and both hips.  IMPRESSION: No acute abdominal abnormality.  Large stool burden.  Original Report Authenticated By: Arnell Sieving, M.D.   Ct Head Wo Contrast  08/30/2011  *RADIOLOGY REPORT*  Clinical Data: Altered mental status  CT HEAD WITHOUT CONTRAST  Technique:  Contiguous axial images were obtained from the base of the skull through the vertex without contrast.  Comparison: CT 08/15/2011 and 02/02/2011  Findings: Encephalomalacia in the left basal ganglia, extending into the left centrum semiovale  is compatible with prior infarction stable in appearance.  The ventricles are within normal limits.  Negative for hydrocephalus, hemorrhage, mass effect, mass lesion, or evidence of acute infarction. Heavy intracranial atherosclerotic calcification noted.  The visualized paranasal sinuses, mastoid air cells, and middle ears are clear.  The skull is intact.  Soft tissues of the scalp are symmetric.  IMPRESSION:  1.  No acute intracranial abnormality. 2.  Remote  infarction in the left basal ganglia and left centrum semiovale.  Original Report Authenticated By: Britta Mccreedy, M.D.   Ct Head Wo Contrast  08/15/2011  *RADIOLOGY REPORT*  Clinical Data: Increasing confusion. Altered mental status.  Prior stroke.  CT HEAD WITHOUT CONTRAST  Technique:  Contiguous axial images were obtained from the base of the skull through the vertex without contrast.  Comparison: CT scan dated 02/02/2011  Findings: There is no acute intracranial hemorrhage, infarction, or mass lesion.  There is an old left basal ganglia infarct extending into the left centrum semiovale.  Diffuse mild cerebral cortical and cerebellar atrophy.  No osseous abnormality.  IMPRESSION: No acute abnormalities.  Old left brain stroke.  Original Report Authenticated By: Gwynn Burly, M.D.   Dg Chest Port 1 View  08/30/2011  *RADIOLOGY REPORT*  Clinical Data: Altered mental status, wheezing  PORTABLE CHEST - 1 VIEW  Comparison: 08/18/2011  Findings: Cardiomediastinal silhouette is stable.  Mild interstitial prominence bilaterally without convincing pulmonary edema.  No focal infiltrate.  IMPRESSION: Mild interstitial prominence bilaterally without convincing pulmonary edema.  No focal infiltrate.  Original Report Authenticated By: Natasha Mead, M.D.   Dg Chest Port 1 View  08/14/2011  *RADIOLOGY REPORT*  Clinical Data: Shortness of breath, confusion and wheezing.  The  PORTABLE CHEST - 1 VIEW  Comparison: 06/26/2011  Findings: There likely is a chronic component of mild interstitial edema and chronic lung disease.  There is no evidence of overt airspace edema or pleural fluid.  Heart size is stable.  IMPRESSION: Chronic lung disease and probable chronic component of interstitial edema.  Original Report Authenticated By: Reola Calkins, M.D.   1.hyponatremia 2.chf 3.ams   MDM  78 year old female evaluated for AMS.  Work up initiated.    Nursing notes reviewed and considered in documentation  Previous  records reviewed and considered  All labs/vitals reviewed and considered  xrays reviewed and considered  Family members sts pt has been gaining fluid despite increase dose of lasix.  13lbs gain in 1 week.   7:39 PM I have consult with triad hospitalist, who agrees to admit the patient for further evaluation of altered mental status. Patient will be admitted to a telemetry bed, team 7, Dr. Letitia Neri, PA-C 08/30/11 1958  Fayrene Helper, PA-C 08/30/11 2000

## 2011-08-31 ENCOUNTER — Encounter (HOSPITAL_COMMUNITY): Payer: Self-pay | Admitting: *Deleted

## 2011-08-31 DIAGNOSIS — E871 Hypo-osmolality and hyponatremia: Secondary | ICD-10-CM

## 2011-08-31 DIAGNOSIS — R4182 Altered mental status, unspecified: Secondary | ICD-10-CM

## 2011-08-31 DIAGNOSIS — E1169 Type 2 diabetes mellitus with other specified complication: Secondary | ICD-10-CM

## 2011-08-31 DIAGNOSIS — Z8679 Personal history of other diseases of the circulatory system: Secondary | ICD-10-CM

## 2011-08-31 LAB — BASIC METABOLIC PANEL
BUN: 7 mg/dL (ref 6–23)
Calcium: 8.8 mg/dL (ref 8.4–10.5)
Chloride: 85 mEq/L — ABNORMAL LOW (ref 96–112)
Creatinine, Ser: 0.66 mg/dL (ref 0.50–1.10)
GFR calc Af Amer: 87 mL/min — ABNORMAL LOW (ref >90)

## 2011-08-31 LAB — GLUCOSE, CAPILLARY
Glucose-Capillary: 116 mg/dL — ABNORMAL HIGH (ref 70–99)
Glucose-Capillary: 141 mg/dL — ABNORMAL HIGH (ref 70–99)
Glucose-Capillary: 158 mg/dL — ABNORMAL HIGH (ref 70–99)
Glucose-Capillary: 211 mg/dL — ABNORMAL HIGH (ref 70–99)

## 2011-08-31 LAB — CBC
HCT: 31.1 % — ABNORMAL LOW (ref 36.0–46.0)
MCHC: 32.8 g/dL (ref 30.0–36.0)
MCV: 88.9 fL (ref 78.0–100.0)
Platelets: 239 10*3/uL (ref 150–400)
RDW: 14.2 % (ref 11.5–15.5)
WBC: 7.8 10*3/uL (ref 4.0–10.5)

## 2011-08-31 LAB — CARDIAC PANEL(CRET KIN+CKTOT+MB+TROPI)
CK, MB: 2.8 ng/mL (ref 0.3–4.0)
Relative Index: INVALID (ref 0.0–2.5)
Total CK: 22 U/L (ref 7–177)
Troponin I: <0.3 ng/mL (ref <0.30)
Troponin I: <0.3 ng/mL (ref <0.30)

## 2011-08-31 MED ORDER — IPRATROPIUM BROMIDE 0.02 % IN SOLN
0.5000 mg | Freq: Four times a day (QID) | RESPIRATORY_TRACT | Status: DC
Start: 2011-08-31 — End: 2011-09-01
  Administered 2011-08-31 (×2): 0.5 mg via RESPIRATORY_TRACT
  Filled 2011-08-31 (×2): qty 2.5

## 2011-08-31 MED ORDER — LEVALBUTEROL HCL 0.63 MG/3ML IN NEBU
0.6300 mg | INHALATION_SOLUTION | Freq: Three times a day (TID) | RESPIRATORY_TRACT | Status: DC
  Administered 2011-08-31 – 2011-09-05 (×13): 0.63 mg via RESPIRATORY_TRACT
  Filled 2011-08-31 (×21): qty 3

## 2011-08-31 MED ORDER — DEXTROSE 50 % IV SOLN: 25.0000 mL | Freq: Once | INTRAVENOUS | Status: AC | PRN

## 2011-08-31 MED ORDER — BIOTENE DRY MOUTH MT LIQD
15.0000 mL | Freq: Two times a day (BID) | OROMUCOSAL | Status: DC
  Administered 2011-08-31 – 2011-09-08 (×13): 15 mL via OROMUCOSAL

## 2011-08-31 NOTE — Progress Notes (Signed)
SLP Cancellation Note  ST received order for BSE this date and not completed as patient presents with  decreased LOA and unable to participate fully for evaluation. Patient's daughters present bedside during attempt and report patient was on "ground diet" (lack of dentition) and thin liquids at SNF.  Family member reports no prior difficulties with swallow.  ST to address on 09/01/11. Moreen Fowler M.S. CCC-SLP [phone]   [facility] 08/31/2011, 2:36 PM

## 2011-08-31 NOTE — Progress Notes (Addendum)
Pt arrived to floor incontinent and arousable to vocal stimuli and able to state date of birth after several cues.  Temp obtained rectally at 95.1 after unable to maintain oral or axilla temp.  CBG 70 and not alert enough to take PO.  1 amp of dextrose given Iv per protocol.  MD notified of the above, orders to apply blankets and insert foley cath done.  Repeat temp 94.5 rectally and notified MD with no new orders.  Did not similar temps charted on prior admit.  ABG results to MD as well.  Orders to continue Q4 neuro checks, do not increase frequency secondary to dx of metabolic encephalopthy.   Will continue to monitor.

## 2011-08-31 NOTE — ED Provider Notes (Signed)
Medical screening examination/treatment/procedure(s) were conducted as a shared visit with non-physician practitioner(s) and myself.  I personally evaluated the patient during the encounter 78 year old lady, looks very good for stated age, remarkably alert.  Lungs with distant breath sounds.  Lab tests and chest x-ray reassuring.  Released back to her facility, with followup visit with Dr. Jacky Kindle recommended.  Carleene Cooper III, MD 08/31/11 1336

## 2011-08-31 NOTE — Progress Notes (Signed)
Clinical Social Worker received an inappropriate referral for [facility]; pt currently with a diagnosis of Dementia and is intermittently oriented to place and time. Per chart review, CSW noted there is currently an [facility] referral indicating that pt is from [facility].  CSW spoke with RN.  CSW explained that Advanced Directive information could not be signed if a person is not oriented x4.  CSW did update FL2 and gathered appropriate documents for potential return to SNF.  Unit CSW to follow up with pt, family, and SNF as appropriate.   Angelia Mould, MSW, LCSWA (Weekend Coverage)

## 2011-08-31 NOTE — Progress Notes (Signed)
Subjective: Pt awake grunting, but answers appropriately, oriented x1. Per nsg more alert this am. She denies sob, no CP. Objective: Vital signs in last 24 hours: Temp:  [94.5 F (34.7 C)-97.8 F (36.6 C)] 97.8 F (36.6 C) (06/09 0420) Pulse Rate:  [51-73] 73  (06/09 0420) Resp:  [16-18] 18  (06/09 0420) BP: (153-194)/(47-74) 156/60 mmHg (06/09 0420) SpO2:  [99 %-100 %] 100 % (06/09 0420) Weight:  [85.6 kg (188 lb 11.4 oz)] 85.6 kg (188 lb 11.4 oz) (06/08 2100) Last BM Date: 08/30/11 Intake/Output from previous day: 06/08 0701 - 06/09 0700 In: 4 [IV Piggyback:4] Out: 975 [Urine:975] Intake/Output this shift:      General Appearance:    Awake, follows commands, cooperative, no distress, appears stated age  Lungs:   Decreased BS at bases, no wheezes respirations unlabored   Heart:    Regular rate and rhythm, S1 and S2 normal, no murmur, rub   or gallop  Abdomen:     Soft, non-tender, bowel sounds present,    no masses, no organomegaly  Extremities:   no cyanosis, trace edema  Neurologic:   CNII-XII intact, nonfocal     Weight change:   Intake/Output Summary (Last 24 hours) at 08/31/11 0830 Last data filed at 08/31/11 0400  Gross per 24 hour  Intake      4 ml  Output    975 ml  Net   -971 ml    Lab Results:   Basename 08/31/11 0610 08/30/11 1719  NA 130* 126*  K 3.7 4.0  CL 85* 84*  CO2 38* 35*  GLUCOSE 70 107*  BUN 7 8  CREATININE 0.66 0.65  CALCIUM 8.8 9.3    Basename 08/31/11 0610 08/30/11 1719  WBC 7.8 9.9  HGB 10.2* 11.4*  HCT 31.1* 34.4*  PLT 239 237  MCV 88.9 88.9   PT/INR No results found for this basename: LABPROT:2,INR:2 in the last 72 hours ABG  Basename 08/30/11 2052  PHART 7.357  HCO3 37.4*    Micro Results: Recent Results (from the past 240 hour(s))  MRSA PCR SCREENING     Status: Normal   Collection Time   08/30/11 11:34 PM      Component Value Range Status Comment   MRSA by PCR NEGATIVE  NEGATIVE  Final     Studies/Results: Ct Head Wo Contrast  08/30/2011  *RADIOLOGY REPORT*  Clinical Data: Altered mental status  CT HEAD WITHOUT CONTRAST  Technique:  Contiguous axial images were obtained from the base of the skull through the vertex without contrast.  Comparison: CT 08/15/2011 and 02/02/2011  Findings: Encephalomalacia in the left basal ganglia, extending into the left centrum semiovale  is compatible with prior infarction stable in appearance.  The ventricles are within normal limits.  Negative for hydrocephalus, hemorrhage, mass effect, mass lesion, or evidence of acute infarction. Heavy intracranial atherosclerotic calcification noted.  The visualized paranasal sinuses, mastoid air cells, and middle ears are clear.  The skull is intact.  Soft tissues of the scalp are symmetric.  IMPRESSION:  1.  No acute intracranial abnormality. 2.  Remote infarction in the left basal ganglia and left centrum semiovale.  Original Report Authenticated By: Britta Mccreedy, M.D.   Dg Chest Port 1 View  08/30/2011  *RADIOLOGY REPORT*  Clinical Data: Altered mental status, wheezing  PORTABLE CHEST - 1 VIEW  Comparison: 08/18/2011  Findings: Cardiomediastinal silhouette is stable.  Mild interstitial prominence bilaterally without convincing pulmonary edema.  No focal infiltrate.  IMPRESSION: Mild interstitial  prominence bilaterally without convincing pulmonary edema.  No focal infiltrate.  Original Report Authenticated By: Natasha Mead, M.D.   Medications: Scheduled Meds:   . antiseptic oral rinse  15 mL Mouth Rinse BID  . aspirin  81 mg Oral Daily  . atorvastatin  10 mg Oral q1800  . dextrose      . diltiazem  180 mg Oral Daily  . DULoxetine  30 mg Oral QHS  . feeding supplement  237 mL Oral TID BM  . furosemide  40 mg Intravenous Q12H  . hydrALAZINE  50 mg Oral Q6H  . levothyroxine  100 mcg Oral QAC breakfast  . losartan  100 mg Oral Daily  . metoprolol  50 mg Oral BID  . sodium chloride  3 mL Intravenous Q12H  .  sodium chloride  3 mL Intravenous Q12H  . DISCONTD: simvastatin  20 mg Oral Daily   Continuous Infusions:  PRN Meds:.sodium chloride, hydrALAZINE, sodium chloride, DISCONTD: ondansetron (ZOFRAN) IV Assessment/Plan: Patient Active Hospital Problem List: Hypoglycemia inDM (diabetes mellitus) (08/30/2011) -BG better- 70-80s this am, continue to monitor as per hypoglycemia protocol - follow and start ssi/meds when appropriate Hyponatremia -improving with diuresis, likely 2/2 CHF -Follow and recheck COPD with CO2 retention (chronic obstructive pulmonary disease) -per ABG likely precipitated by xanax - now dc'ed -bronchodilators, recheck ABG in am Metabolic encephalopathy (06/22/2011) Multifactorial 2/2 to above, treating as discussed above Diastolic CHF, chronic (08/30/2011) -continue lasix IV, follow and change to oral when appropriate, outpt meds and follow - CEs negative  Hypertension -continue metoprolol, losartan, metoprolol, ditiazem CAD (coronary artery disease) () History of CVA (cerebrovascular accident)       LOS: 1 day   Garon Melander C 08/31/2011, 8:30 AM

## 2011-09-01 DIAGNOSIS — R4182 Altered mental status, unspecified: Secondary | ICD-10-CM

## 2011-09-01 DIAGNOSIS — E871 Hypo-osmolality and hyponatremia: Secondary | ICD-10-CM

## 2011-09-01 DIAGNOSIS — Z8679 Personal history of other diseases of the circulatory system: Secondary | ICD-10-CM

## 2011-09-01 DIAGNOSIS — E1169 Type 2 diabetes mellitus with other specified complication: Secondary | ICD-10-CM

## 2011-09-01 LAB — BLOOD GAS, ARTERIAL
Drawn by: 13898
O2 Content: 2 L/min
pCO2 arterial: 65 mmHg (ref 35.0–45.0)
pH, Arterial: 7.418 — ABNORMAL HIGH (ref 7.350–7.400)
pO2, Arterial: 92.5 mmHg (ref 80.0–100.0)

## 2011-09-01 LAB — BASIC METABOLIC PANEL
GFR calc Af Amer: 84 mL/min — ABNORMAL LOW (ref >90)
GFR calc non Af Amer: 73 mL/min — ABNORMAL LOW (ref >90)
Glucose, Bld: 177 mg/dL — ABNORMAL HIGH (ref 70–99)
Potassium: 3.6 mEq/L (ref 3.5–5.1)
Sodium: 128 mEq/L — ABNORMAL LOW (ref 135–145)

## 2011-09-01 LAB — GLUCOSE, CAPILLARY
Glucose-Capillary: 195 mg/dL — ABNORMAL HIGH (ref 70–99)
Glucose-Capillary: 181 mg/dL — ABNORMAL HIGH (ref 70–99)
Glucose-Capillary: 191 mg/dL — ABNORMAL HIGH (ref 70–99)
Glucose-Capillary: 244 mg/dL — ABNORMAL HIGH (ref 70–99)

## 2011-09-01 LAB — CBC
Hemoglobin: 10.5 g/dL — ABNORMAL LOW (ref 12.0–15.0)
RBC: 3.38 MIL/uL — ABNORMAL LOW (ref 3.87–5.11)
WBC: 6.9 10*3/uL (ref 4.0–10.5)

## 2011-09-01 MED ORDER — IPRATROPIUM BROMIDE 0.02 % IN SOLN
0.5000 mg | Freq: Three times a day (TID) | RESPIRATORY_TRACT | Status: DC
  Administered 2011-09-01 – 2011-09-05 (×12): 0.5 mg via RESPIRATORY_TRACT
  Filled 2011-09-01 (×17): qty 2.5

## 2011-09-01 MED ORDER — INSULIN ASPART 100 UNIT/ML ~~LOC~~ SOLN
0.0000 [IU] | Freq: Three times a day (TID) | SUBCUTANEOUS | Status: DC
  Administered 2011-09-01: 5 [IU] via SUBCUTANEOUS
  Administered 2011-09-01: 11 [IU] via SUBCUTANEOUS
  Administered 2011-09-02 (×2): 8 [IU] via SUBCUTANEOUS
  Administered 2011-09-02 – 2011-09-03 (×2): 5 [IU] via SUBCUTANEOUS
  Administered 2011-09-03: 8 [IU] via SUBCUTANEOUS
  Administered 2011-09-03: 5 [IU] via SUBCUTANEOUS
  Administered 2011-09-04: 3 [IU] via SUBCUTANEOUS
  Administered 2011-09-04: 8 [IU] via SUBCUTANEOUS
  Administered 2011-09-04: 11 [IU] via SUBCUTANEOUS
  Administered 2011-09-05: 5 [IU] via SUBCUTANEOUS
  Administered 2011-09-05: 8 [IU] via SUBCUTANEOUS
  Administered 2011-09-06 (×2): 5 [IU] via SUBCUTANEOUS
  Administered 2011-09-06: 3 [IU] via SUBCUTANEOUS
  Administered 2011-09-07: 2 [IU] via SUBCUTANEOUS
  Administered 2011-09-07 (×2): 5 [IU] via SUBCUTANEOUS
  Administered 2011-09-08: 3 [IU] via SUBCUTANEOUS
  Administered 2011-09-08: 5 [IU] via SUBCUTANEOUS

## 2011-09-01 NOTE — Evaluation (Signed)
Clinical/Bedside Swallow Evaluation Patient Details  Name: [name] MRN: [id number] Date of Birth: 02-23-1919  Today's Date: 09/01/2011 Time: 9562-1308 SLP Time Calculation (min): 13 min  Past Medical History:  Past Medical History  Diagnosis Date  . Dysphagia   . Diabetes mellitus   . Hypertension   . Hypothyroidism   . Hyperlipemia   . GERD (gastroesophageal reflux disease)   . Depression   . Anxiety   . Acute MI   . Pneumonia   . Stroke   . UTI (urinary tract infection)   . COPD (chronic obstructive pulmonary disease)   . CAD (coronary artery disease)   . Late effects of CVA (cerebrovascular accident)   . DEMENTIA    Past Surgical History:  Past Surgical History  Procedure Date  . Back surgery    HPI:  78 year old female with metabolic encephalopathy/sedation from unclear etiology    Assessment / Plan / Recommendation Clinical Impression  Patient presents with what appears to be a safe and functional oropharyngeal swallow at this time. No overt indication of aspiration observed. Noted patient previously on a mechanical soft diet, likely due to missing dentition. Will downgrade diet here as well to facilitate easier mastication of solids.     Aspiration Risk  Mild    Diet Recommendation Dysphagia 3 (Mechanical Soft);Thin liquid   Liquid Administration via: Cup;Straw Medication Administration: Whole meds with liquid Supervision: Patient able to self feed;Intermittent supervision to cue for compensatory strategies Compensations: Slow rate;Small sips/bites Postural Changes and/or Swallow Maneuvers: Seated upright 90 degrees    Other  Recommendations Oral Care Recommendations: Oral care BID   Follow Up Recommendations  None       Pertinent Vitals/Pain n/a        Swallow Study Prior Functional Status       General HPI: 78 year old female with metabolic encephalopathy/sedation from unclear etiology  Type of Study: Bedside swallow evaluation Diet  Prior to this Study: Regular;Thin liquids (on mechanical soft diet PTA per chart) Temperature Spikes Noted: No Respiratory Status: Supplemental O2 delivered via (comment) (nasal cannula) History of Recent Intubation: No Behavior/Cognition: Alert;Cooperative;Pleasant mood;Requires cueing;Hard of hearing Oral Cavity - Dentition: Poor condition;Missing dentition Self-Feeding Abilities: Able to feed self Patient Positioning: Upright in bed Baseline Vocal Quality: Clear Volitional Cough: Strong Volitional Swallow: Able to elicit    Oral/Motor/Sensory Function Overall Oral Motor/Sensory Function: Appears within functional limits for tasks assessed   Ice Chips Ice chips: Not tested   Thin Liquid Thin Liquid: Within functional limits Presentation: Cup;Self Fed;Straw    Nectar Thick Nectar Thick Liquid: Not tested   Honey Thick Honey Thick Liquid: Not tested   Puree Puree: Within functional limits Presentation: Spoon   Solid Solid: Within functional limits (moistened due to missing dentition) Presentation: Roberta Flor MA, CCC-SLP [phone]  [name] [name] 09/01/2011,9:42 AM

## 2011-09-01 NOTE — Plan of Care (Signed)
Problem: Phase I Progression Outcomes Goal: Initial discharge plan identified Outcome: Completed/Met Date Met:  09/01/11 D/C to [facility]

## 2011-09-01 NOTE — Progress Notes (Addendum)
Subjective: Mental status at baseline I'm familiar with the patient from a previous admission This is her baseline mentation  Objective: Vital signs in last 24 hours: Filed Vitals:   08/31/11 2055 08/31/11 2100 09/01/11 0400 09/01/11 0821  BP:  168/62 152/55 165/65  Pulse:  66 64 69  Temp:  97 F (36.1 C) 96.1 F (35.6 C) 96.4 F (35.8 C)  TempSrc:  Axillary Axillary Axillary  Resp:  20 20 18   Height:      Weight:   84 kg (185 lb 3 oz)   SpO2: 97% 100% 100% 100%    Intake/Output Summary (Last 24 hours) at 09/01/11 1130 Last data filed at 09/01/11 6045  Gross per 24 hour  Intake    263 ml  Output      0 ml  Net    263 ml    Weight change: -1.6 kg (-3 lb 8.4 oz)  General Appearance:  Awake, follows commands, cooperative, no distress, appears stated age  Lungs:  Decreased BS at bases, no wheezes respirations unlabored  Heart: Regular rate and rhythm, S1 and S2 normal, no murmur, rub  or gallop  Abdomen: Soft, non-tender, bowel sounds present,  no masses, no organomegaly  Extremities:  no cyanosis, trace edema  Neurologic: CNII-XII intact, nonfocal    Lab Results: Results for orders placed during the hospital encounter of 08/30/11 (from the past 24 hour(s))  GLUCOSE, CAPILLARY     Status: Abnormal   Collection Time   08/31/11 11:49 AM      Component Value Range   Glucose-Capillary 141 (*) 70 - 99 (mg/dL)  CARDIAC PANEL(CRET KIN+CKTOT+MB+TROPI)     Status: Normal   Collection Time   08/31/11 12:59 PM      Component Value Range   Total CK 23  7 - 177 (U/L)   CK, MB 2.8  0.3 - 4.0 (ng/mL)   Troponin I <0.30  <0.30 (ng/mL)   Relative Index RELATIVE INDEX IS INVALID  0.0 - 2.5   GLUCOSE, CAPILLARY     Status: Abnormal   Collection Time   08/31/11  4:42 PM      Component Value Range   Glucose-Capillary 158 (*) 70 - 99 (mg/dL)   Comment 1 Notify RN    GLUCOSE, CAPILLARY     Status: Abnormal   Collection Time   08/31/11  8:00 PM      Component Value Range   Glucose-Capillary 211 (*) 70 - 99 (mg/dL)  GLUCOSE, CAPILLARY     Status: Abnormal   Collection Time   08/31/11 11:18 PM      Component Value Range   Glucose-Capillary 195 (*) 70 - 99 (mg/dL)  GLUCOSE, CAPILLARY     Status: Abnormal   Collection Time   09/01/11  3:57 AM      Component Value Range   Glucose-Capillary 181 (*) 70 - 99 (mg/dL)  BLOOD GAS, ARTERIAL     Status: Abnormal   Collection Time   09/01/11  4:27 AM      Component Value Range   O2 Content 2.0     Delivery systems NASAL CANNULA     pH, Arterial 7.418 (*) 7.350 - 7.400    pCO2 arterial 65.0 (*) 35.0 - 45.0 (mmHg)   pO2, Arterial 92.5  80.0 - 100.0 (mmHg)   Bicarbonate 41.7 (*) 20.0 - 24.0 (mEq/L)   TCO2 43.8  0 - 100 (mmol/L)   Acid-Base Excess 16.0 (*) 0.0 - 2.0 (mmol/L)   O2 Saturation  99.5     Patient temperature 97.0     Collection site RIGHT RADIAL     Drawn by [phone]     Sample type ARTERIAL     Allens test (pass/fail) PASS  PASS   BASIC METABOLIC PANEL     Status: Abnormal   Collection Time   09/01/11  5:30 AM      Component Value Range   Sodium 128 (*) 135 - 145 (mEq/L)   Potassium 3.6  3.5 - 5.1 (mEq/L)   Chloride 85 (*) 96 - 112 (mEq/L)   CO2 37 (*) 19 - 32 (mEq/L)   Glucose, Bld 177 (*) 70 - 99 (mg/dL)   BUN 8  6 - 23 (mg/dL)   Creatinine, Ser 6.04  0.50 - 1.10 (mg/dL)   Calcium 8.7  8.4 - 54.0 (mg/dL)   GFR calc non Af Amer 73 (*) >90 (mL/min)   GFR calc Af Amer 84 (*) >90 (mL/min)  CBC     Status: Abnormal   Collection Time   09/01/11  5:30 AM      Component Value Range   WBC 6.9  4.0 - 10.5 (K/uL)   RBC 3.38 (*) 3.87 - 5.11 (MIL/uL)   Hemoglobin 10.5 (*) 12.0 - 15.0 (g/dL)   HCT 98.1 (*) 19.1 - 46.0 (%)   MCV 90.5  78.0 - 100.0 (fL)   MCH 31.1  26.0 - 34.0 (pg)   MCHC 34.3  30.0 - 36.0 (g/dL)   RDW 47.8  29.5 - 62.1 (%)   Platelets 237  150 - 400 (K/uL)  GLUCOSE, CAPILLARY     Status: Abnormal   Collection Time   09/01/11  7:34 AM      Component Value Range   Glucose-Capillary 191 (*)  70 - 99 (mg/dL)   Comment 1 Notify RN       Micro: Recent Results (from the past 240 hour(s))  MRSA PCR SCREENING     Status: Normal   Collection Time   08/30/11 11:34 PM      Component Value Range Status Comment   MRSA by PCR NEGATIVE  NEGATIVE  Final     Studies/Results: Ct Head Wo Contrast  08/30/2011  *RADIOLOGY REPORT*  Clinical Data: Altered mental status  CT HEAD WITHOUT CONTRAST  Technique:  Contiguous axial images were obtained from the base of the skull through the vertex without contrast.  Comparison: CT 08/15/2011 and 02/02/2011  Findings: Encephalomalacia in the left basal ganglia, extending into the left centrum semiovale  is compatible with prior infarction stable in appearance.  The ventricles are within normal limits.  Negative for hydrocephalus, hemorrhage, mass effect, mass lesion, or evidence of acute infarction. Heavy intracranial atherosclerotic calcification noted.  The visualized paranasal sinuses, mastoid air cells, and middle ears are clear.  The skull is intact.  Soft tissues of the scalp are symmetric.  IMPRESSION:  1.  No acute intracranial abnormality. 2.  Remote infarction in the left basal ganglia and left centrum semiovale.  Original Report Authenticated By: Britta Mccreedy, M.D.   Dg Chest Port 1 View  08/30/2011  *RADIOLOGY REPORT*  Clinical Data: Altered mental status, wheezing  PORTABLE CHEST - 1 VIEW  Comparison: 08/18/2011  Findings: Cardiomediastinal silhouette is stable.  Mild interstitial prominence bilaterally without convincing pulmonary edema.  No focal infiltrate.  IMPRESSION: Mild interstitial prominence bilaterally without convincing pulmonary edema.  No focal infiltrate.  Original Report Authenticated By: Natasha Mead, M.D.    Medications:  Scheduled Meds:   .  antiseptic oral rinse  15 mL Mouth Rinse BID  . aspirin  81 mg Oral Daily  . atorvastatin  10 mg Oral q1800  . diltiazem  180 mg Oral Daily  . feeding supplement  237 mL Oral TID BM  .  furosemide  40 mg Intravenous Q12H  . hydrALAZINE  50 mg Oral Q6H  . ipratropium  0.5 mg Nebulization TID  . levalbuterol  0.63 mg Nebulization TID  . levothyroxine  100 mcg Oral QAC breakfast  . losartan  100 mg Oral Daily  . metoprolol  50 mg Oral BID  . sodium chloride  3 mL Intravenous Q12H  . sodium chloride  3 mL Intravenous Q12H  . DISCONTD: DULoxetine  30 mg Oral QHS  . DISCONTD: ipratropium  0.5 mg Nebulization Q6H   Continuous Infusions:  PRN Meds:.sodium chloride, dextrose, hydrALAZINE, sodium chloride   Assessment: Principal Problem:  *Metabolic encephalopathy Active Problems:  COPD (chronic obstructive pulmonary disease)  Hypertension  CAD (coronary artery disease)  Late effects of CVA (cerebrovascular accident)  Hypoglycemia  Diastolic CHF, chronic  DM (diabetes mellitus)   Plan: Hypoglycemia inDM (diabetes mellitus) (08/30/2011)  -BG better- 70-80s this am, continue to monitor as per hypoglycemia protocol  - follow and start ssi/meds when appropriate  Hyponatremia  -improving with diuresis, likely 2/2 CHF , fluid restriction and 1200 cc -Follow and recheck  COPD with CO2 retention (chronic obstructive pulmonary disease)  -per ABG likely precipitated by xanax - now dc'ed  -bronchodilators, recheck ABG in am  Metabolic encephalopathy (06/22/2011)  Multifactorial 2/2 to above, treating as discussed above  Diastolic CHF, chronic (08/30/2011)  -continue lasix IV, follow and change to oral when appropriate, outpt meds and follow  - CEs negative Hypertension  -continue metoprolol, losartan, metoprolol, ditiazem CAD (coronary artery disease) () History of CVA (cerebrovascular accident)  dysphagia Per speech therapy evaluation Dysphagia 3 (Mechanical Soft);Thin liquid       LOS: 2 days   Marykay Mccleod 09/01/2011, 11:30 AM

## 2011-09-01 NOTE — Plan of Care (Signed)
Problem: Phase I Progression Outcomes Goal: OOB as tolerated unless otherwise ordered Outcome: Progressing PT/OT consults added today

## 2011-09-01 NOTE — Progress Notes (Signed)
CRITICAL VALUE ALERT  Critical value received: ph7.41, CO2 65, PO2 92, Bicarb 41, sat 99%  Date of notification:   6/10  Time of notification:  0440  Critical value read back:yes  Nurse who received alert:  Westley Chandler, RN  MD notified (1st page):  Janan Ridge  Time of first page:  [phone]  MD notified (2nd page):  Time of second page:  Responding MD: Janan Ridge   Time MD responded:  [phone]

## 2011-09-01 NOTE — Clinical Social Work Psychosocial (Signed)
     Clinical Social Work Department BRIEF PSYCHOSOCIAL ASSESSMENT 09/01/2011  Patient:  [name], [name]     Account Number:  [id number]     Admit date:  08/30/2011  Clinical Social Worker:  Doree Albee  Date/Time:  09/01/2011 11:57 AM  Referred by:  RN  Date Referred:  08/31/2011 Referred for  SNF Placement   Other Referral:   Interview type:  Patient Other interview type:    PSYCHOSOCIAL DATA Living Status:  FACILITY Admitted from facility:  [facility] Level of care:  Skilled Nursing Facility Primary support name:  Alfredia Client Primary support relationship to patient:  CHILD, ADULT Degree of support available:   strong    CURRENT CONCERNS Current Concerns  Post-Acute Placement   Other Concerns:    SOCIAL WORK ASSESSMENT / PLAN CSW met with pt at bedside to discuss pt current living environment and discharge plans. Pt was unable to recall where she was living prior to hosptialization.    Pt gave CSW permission to speak with daughter. Pt shared that pt daughter had just left pt room. However was returning this afternoon if csw could not reach pt daughter by phone.    CSW spoke with pt daughter by phone who confirmed that patient was admitted from [facility] and plans to return when medically stable.    Per discussion with RN, pt family felt pt may have been given excessive doses of xanax at facility. CSW will follow up and discuss further with pt and pt family. CSW will also provide information regarding obudsman program services.   Assessment/plan status:  Psychosocial Support/Ongoing Assessment of Needs Other assessment/ plan:   Information/referral to community resources:   [employer] Information    PATIENTS/FAMILYS RESPONSE TO PLAN OF CARE: Pt and pt daughter thanked csw for concern and support. Pt and pt duaghter are motivated for pt to return to [facility] when medically stable.    CSW will complete fl2 for md signature and place in  shadow chart.

## 2011-09-01 NOTE — Progress Notes (Signed)
Clinical social worker spoke with pt daughter by phone regarding Ombudsman information for any follow up concerns regarding patient care at facility. Pt daughter appreciated csw concern and support and referral to [facility] program.    .Clinical social worker continuing to follow pt to assist with pt dc plans and further csw needs.   Catha Gosselin, Theresia Majors  [phone] .09/01/2011 1518pm

## 2011-09-01 NOTE — Progress Notes (Addendum)
Nutrition Brief Note  RD pulled to patient due to Nutrition Risk Report: problems chewing or swallowing foods and/or liquids. S/p bedside swallow evaluation today; patient presents with safe/ functional swallow at this time. Currently on a Dysphagia 3, thin liquid diet; PO intake 75% for breakfast; drinking Ensure supplements. Agree with continuation of supplements as inpatient. Patient and daughter denied nutrition related concerns. BMI = 27.9 kg/m2. No nutrition diagnosis and intervention at this time. Please consult RD as needed.  Alger Memos Pager #: [phone]

## 2011-09-01 NOTE — Plan of Care (Signed)
Problem: Phase I Progression Outcomes Goal: Voiding-avoid urinary catheter unless indicated Outcome: Not Met (add Reason) Pt has foley for adequate I/O monitoring

## 2011-09-02 DIAGNOSIS — E871 Hypo-osmolality and hyponatremia: Secondary | ICD-10-CM

## 2011-09-02 DIAGNOSIS — R4182 Altered mental status, unspecified: Secondary | ICD-10-CM

## 2011-09-02 DIAGNOSIS — E1169 Type 2 diabetes mellitus with other specified complication: Secondary | ICD-10-CM

## 2011-09-02 DIAGNOSIS — Z8679 Personal history of other diseases of the circulatory system: Secondary | ICD-10-CM

## 2011-09-02 LAB — GLUCOSE, CAPILLARY
Glucose-Capillary: 267 mg/dL — ABNORMAL HIGH (ref 70–99)
Glucose-Capillary: 265 mg/dL — ABNORMAL HIGH (ref 70–99)
Glucose-Capillary: 241 mg/dL — ABNORMAL HIGH (ref 70–99)
Glucose-Capillary: 289 mg/dL — ABNORMAL HIGH (ref 70–99)

## 2011-09-02 LAB — BASIC METABOLIC PANEL
BUN: 9 mg/dL (ref 6–23)
CO2: 35 mEq/L — ABNORMAL HIGH (ref 19–32)
Calcium: 8.8 mg/dL (ref 8.4–10.5)
Glucose, Bld: 236 mg/dL — ABNORMAL HIGH (ref 70–99)
Potassium: 3.5 mEq/L (ref 3.5–5.1)
Sodium: 127 mEq/L — ABNORMAL LOW (ref 135–145)

## 2011-09-02 MED ORDER — ENOXAPARIN SODIUM 40 MG/0.4ML ~~LOC~~ SOLN
40.0000 mg | SUBCUTANEOUS | Status: DC
  Administered 2011-09-02 – 2011-09-08 (×7): 40 mg via SUBCUTANEOUS
  Filled 2011-09-02 (×7): qty 0.4

## 2011-09-02 MED ORDER — DEMECLOCYCLINE HCL 150 MG PO TABS
300.0000 mg | ORAL_TABLET | Freq: Four times a day (QID) | ORAL | Status: DC
  Administered 2011-09-02 – 2011-09-04 (×9): 300 mg via ORAL
  Filled 2011-09-02 (×12): qty 2

## 2011-09-02 MED ORDER — INSULIN GLARGINE 100 UNIT/ML ~~LOC~~ SOLN
25.0000 [IU] | Freq: Every day | SUBCUTANEOUS | Status: DC
  Administered 2011-09-02 – 2011-09-03 (×2): 25 [IU] via SUBCUTANEOUS

## 2011-09-02 MED ORDER — FUROSEMIDE 40 MG PO TABS
40.0000 mg | ORAL_TABLET | Freq: Two times a day (BID) | ORAL | Status: DC
  Administered 2011-09-02: 40 mg via ORAL
  Filled 2011-09-02 (×3): qty 1

## 2011-09-02 MED ORDER — FUROSEMIDE 40 MG PO TABS
60.0000 mg | ORAL_TABLET | Freq: Two times a day (BID) | ORAL | Status: DC
  Administered 2011-09-02 – 2011-09-05 (×7): 60 mg via ORAL
  Filled 2011-09-02 (×12): qty 1

## 2011-09-02 NOTE — Progress Notes (Signed)
UR Completed Royetta Probus Graves-Bigelow, RN,BSN [phone]  

## 2011-09-02 NOTE — Evaluation (Signed)
Physical Therapy Evaluation Patient Details Name: [name] MRN: [id number] DOB: 1918-05-16 Today's Date: 09/02/2011 Time: 4098-1191 PT Time Calculation (min): 24 min  PT Assessment / Plan / Recommendation Clinical Impression  Pt admitted for CHF with  hypoglycemia and co2 retention. Pt from SNF, where she has lived for 5 years. Pt presents with generalized weakness and will benefit from skilled PT to address below impairments for safe return to SNF with decreased caregiver burden and falls risk. Pt near baseline functioning, but has been more I with transfers recently per daughter, thus will benefit from skilled PT to progress towards maximal functioning.     PT Assessment  Patient needs continued PT services    Follow Up Recommendations  Skilled nursing facility;Supervision/Assistance - 24 hour    Barriers to Discharge None      lEquipment Recommendations  Defer to next venue    Recommendations for Other Services     Frequency Min 3X/week    Precautions / Restrictions Precautions Precautions: Fall Restrictions Weight Bearing Restrictions: No   Pertinent Vitals/Pain No complaints      Mobility  Bed Mobility Bed Mobility: Not assessed (Pt up in chair, wanting to stay in chair) Supine to Sit: 2: Max assist Details for Bed Mobility Assistance: required (A) to sequence task  Transfers Transfers: Sit to Stand;Stand to Sit Sit to Stand: 2: Max assist;From bed;With upper extremity assist Sit to Stand: Patient Percentage: 60% Stand to Sit: 1: +2 Total assist;With upper extremity assist;With armrests;To chair/3-in-1 Stand to Sit: Patient Percentage: 50% Details for Transfer Assistance: Pt needs cues for safety, adn wtih uncontrolled descent even with cues. Pt able to stand x90 sec with Min A and cues to straighten knees and trunk. Activity tolerance limited due to fatigue/weakness Ambulation/Gait Ambulation/Gait Assistance: Not tested (comment) (Pt not ambulator) Stairs:  No Wheelchair Mobility Wheelchair Mobility: No    Exercises General Exercises - Lower Extremity Ankle Circles/Pumps: AROM;Strengthening;Both;10 reps;Seated Long Arc Quad: AROM;Strengthening;Both;15 reps;Seated   PT Diagnosis: Generalized weakness  PT Problem List: Decreased strength;Decreased activity tolerance;Decreased balance;Decreased mobility;Decreased cognition;Decreased knowledge of use of DME;Decreased safety awareness;Decreased knowledge of precautions PT Treatment Interventions: DME instruction;Gait training;Functional mobility training;Therapeutic activities;Therapeutic exercise;Balance training;Patient/family education;Wheelchair mobility training   PT Goals Acute Rehab PT Goals PT Goal Formulation: With patient/family Time For Goal Achievement: 09/09/11 Potential to Achieve Goals: Good Pt will go Sit to Stand: with mod assist;with upper extremity assist PT Goal: Sit to Stand - Progress: Goal set today Pt will go Stand to Sit: with mod assist;with upper extremity assist PT Goal: Stand to Sit - Progress: Goal set today Pt will Transfer Bed to Chair/Chair to Bed: with mod assist PT Transfer Goal: Bed to Chair/Chair to Bed - Progress: Goal set today  Visit Information  Last PT Received On: 09/02/11 Assistance Needed: +2    Subjective Data  Subjective: "Feels rotten." Regarding standing Patient Stated Goal: None stated   Prior Functioning  Home Living Available Help at Discharge: Skilled Nursing Facility Type of Home: Skilled Nursing Facility Home Adaptive Equipment: Wheelchair - manual Additional Comments: Pt living at [facility] for 5 years, daughter reports pt was receiving therapy prior to admission Prior Function Level of Independence: Needs assistance Needs Assistance: Transfers Transfer Assistance: Assist for transfers PTA at SNF, but able to progress towards Mod I when not recently hospitalized Able to Take Stairs?: No Driving: No Comments: Daughter  reports pt recently needing assist with transfer, but has been Mod I in recent past Communication Communication: HOH Dominant  Hand: Right    Cognition  Overall Cognitive Status: History of cognitive impairments - at baseline Arousal/Alertness: Awake/alert Orientation Level: Disoriented to;Time;Situation Behavior During Session: [facility] for tasks performed    Extremity/Trunk Assessment Right Upper Extremity Assessment RUE ROM/Strength/Tone: [facility] for tasks assessed Left Upper Extremity Assessment LUE ROM/Strength/Tone: WFL for tasks assessed Right Lower Extremity Assessment RLE ROM/Strength/Tone: Unable to fully assess;Due to impaired cognition RLE ROM/Strength/Tone Deficits: grossly at least 3+/5 per functional observation Left Lower Extremity Assessment LLE ROM/Strength/Tone: Unable to fully assess;Due to impaired cognition LLE ROM/Strength/Tone Deficits: grossly at least 3+/5 per functional observation Trunk Assessment Trunk Assessment: Normal   Balance Balance Balance Assessed: Yes Static Sitting Balance Static Sitting - Balance Support: No upper extremity supported;Feet supported Static Sitting - Level of Assistance: 5: Stand by assistance Static Sitting - Comment/# of Minutes: Sat edge of chair with hands in lap x73min with SBA only Dynamic Sitting Balance Dynamic Sitting - Balance Support: No upper extremity supported;Feet supported Dynamic Sitting - Level of Assistance: 4: Min assist Dynamic Sitting - Balance Activities: Lateral lean/weight shifting;Forward lean/weight shifting;Reaching for objects;Reaching across midline Dynamic Sitting - Comments: x87min reaching for targets in all quadants.  Static Standing Balance Static Standing - Balance Support: Bilateral upper extremity supported Static Standing - Level of Assistance: 4: Min assist Static Standing - Comment/# of Minutes: x90 sec with no LOB, but limited by fatigue  End of Session PT - End of Session Equipment Utilized  During Treatment: Gait belt Activity Tolerance: Patient limited by fatigue;Patient tolerated treatment well Patient left: in chair;with call bell/phone within reach;with chair alarm set;with family/visitor present Nurse Communication: Mobility status   Virl Cagey, [place] [phone]  09/02/2011, 1:04 PM

## 2011-09-02 NOTE — Progress Notes (Signed)
Subjective: Somewhat confused today Noted to be hypotonic  Objective: Vital signs in last 24 hours: Filed Vitals:   09/01/11 2124 09/02/11 0030 09/02/11 0400 09/02/11 0749  BP:  150/53 160/53 176/68  Pulse:  66 65 73  Temp:  97.5 F (36.4 C) 98.6 F (37 C) 96.9 F (36.1 C)  TempSrc:      Resp:  18 18 18   Height:      Weight:   82.3 kg (181 lb 7 oz)   SpO2: 99% 96% 96% 96%    Intake/Output Summary (Last 24 hours) at 09/02/11 0849 Last data filed at 09/02/11 0815  Gross per 24 hour  Intake    604 ml  Output   1125 ml  Net   -521 ml    Weight change: -1.7 kg (-3 lb 12 oz)  General appearance: no distress and sedated opens eyes to voice follows simple commands  Neck: no JVD and supple, symmetrical, trachea midline  Lungs: clear to auscultation bilaterally  Heart: regular rate and rhythm, S1, S2 normal, no murmur, click, rub or gallop  Abdomen: soft, non-tender; bowel sounds normal; no masses, no organomegaly  Extremities: extremities normal, atraumatic, no cyanosis and edema trace  Pulses: 2+ and symmetric  Skin: Skin color, texture, turgor normal. No rashes or lesions  Neurologic: no focal def   Lab Results: Results for orders placed during the hospital encounter of 08/30/11 (from the past 24 hour(s))  GLUCOSE, CAPILLARY     Status: Abnormal   Collection Time   09/01/11 11:53 AM      Component Value Range   Glucose-Capillary 244 (*) 70 - 99 (mg/dL)   Comment 1 Notify RN    GLUCOSE, CAPILLARY     Status: Abnormal   Collection Time   09/01/11  4:34 PM      Component Value Range   Glucose-Capillary 334 (*) 70 - 99 (mg/dL)   Comment 1 Notify RN    GLUCOSE, CAPILLARY     Status: Abnormal   Collection Time   09/01/11  8:22 PM      Component Value Range   Glucose-Capillary 248 (*) 70 - 99 (mg/dL)  BASIC METABOLIC PANEL     Status: Abnormal   Collection Time   09/02/11  5:00 AM      Component Value Range   Sodium 127 (*) 135 - 145 (mEq/L)   Potassium 3.5  3.5 - 5.1  (mEq/L)   Chloride 84 (*) 96 - 112 (mEq/L)   CO2 35 (*) 19 - 32 (mEq/L)   Glucose, Bld 236 (*) 70 - 99 (mg/dL)   BUN 9  6 - 23 (mg/dL)   Creatinine, Ser 1.61  0.50 - 1.10 (mg/dL)   Calcium 8.8  8.4 - 09.6 (mg/dL)   GFR calc non Af Amer 73 (*) >90 (mL/min)   GFR calc Af Amer 84 (*) >90 (mL/min)  GLUCOSE, CAPILLARY     Status: Abnormal   Collection Time   09/02/11  7:40 AM      Component Value Range   Glucose-Capillary 267 (*) 70 - 99 (mg/dL)   Comment 1 Notify RN       Micro: Recent Results (from the past 240 hour(s))  MRSA PCR SCREENING     Status: Normal   Collection Time   08/30/11 11:34 PM      Component Value Range Status Comment   MRSA by PCR NEGATIVE  NEGATIVE  Final     Studies/Results: No results found.  Medications:  Scheduled Meds:   . antiseptic oral rinse  15 mL Mouth Rinse BID  . aspirin  81 mg Oral Daily  . atorvastatin  10 mg Oral q1800  . diltiazem  180 mg Oral Daily  . enoxaparin (LOVENOX) injection  40 mg Subcutaneous Q24H  . feeding supplement  237 mL Oral TID BM  . furosemide  40 mg Intravenous Q12H  . hydrALAZINE  50 mg Oral Q6H  . insulin aspart  0-15 Units Subcutaneous TID WC  . ipratropium  0.5 mg Nebulization TID  . levalbuterol  0.63 mg Nebulization TID  . levothyroxine  100 mcg Oral QAC breakfast  . losartan  100 mg Oral Daily  . metoprolol  50 mg Oral BID  . sodium chloride  3 mL Intravenous Q12H  . sodium chloride  3 mL Intravenous Q12H   Continuous Infusions:  PRN Meds:.sodium chloride, hydrALAZINE, sodium chloride   Assessment: Principal Problem:  *Metabolic encephalopathy Active Problems:  COPD (chronic obstructive pulmonary disease)  Hypertension  CAD (coronary artery disease)  Late effects of CVA (cerebrovascular accident)  Hypoglycemia  Diastolic CHF, chronic  DM (diabetes mellitus)   Plan: Hypoglycemia inDM (diabetes mellitus) (08/30/2011)  -BG better-in the 200s this am, continue to monitor as per hypoglycemia  protocol  -Restart Lantus today  Hyponatremia  -improving with diuresis, likely 2/2 CHF , fluid restriction and 1200 cc  -Sodium trending lower, will check a cortisol level, trial of demeclocycline  COPD with CO2 retention (chronic obstructive pulmonary disease)  -per ABG likely precipitated by xanax - now dc'ed  -bronchodilators,    Metabolic encephalopathy (06/22/2011)  Multifactorial 2/2 to above, treating as discussed above   Diastolic CHF, chronic (08/30/2011)  -Change to Lasix oral today, continue beta blocker - CEs negative   Hypertension  -continue metoprolol, losartan, metoprolol, ditiazem  History of paroxysmal atrial fibrillation during her last admission Not a candidate for anticoagulation, and family does not want her to be on anticoagulation continue metoprolol diltiazem  CAD (coronary artery disease) () History of CVA (cerebrovascular accident), continue aspirin  dysphagia  Per speech therapy evaluation Dysphagia 3 (Mechanical Soft);Thin liquid and fluid restriction  Anticipate discharge in one to 2 days sodium improving       LOS: 3 days   [facility] 09/02/2011, 8:49 AM

## 2011-09-02 NOTE — Evaluation (Signed)
Occupational Therapy Evaluation Patient Details Name: [name] MRN: [id number] DOB: 02/18/1919 Today's Date: 09/02/2011 Time: 4098-1191 OT Time Calculation (min): 21 min  OT Assessment / Plan / Recommendation Clinical Impression  78 yo female admitted for hypoglycemia and co2 retention that could benefit from skilled OT acutely. Pt deconditioned and demonstrates dyspnea of 3 out 4     OT Assessment  Patient needs continued OT Services    Follow Up Recommendations  Skilled nursing facility    Barriers to Discharge      Equipment Recommendations  Defer to next venue    Recommendations for Other Services    Frequency  Min 2X/week    Precautions / Restrictions Precautions Precautions: Fall Restrictions Weight Bearing Restrictions: No   Pertinent Vitals/Pain None reported Dyspnea 3 out 4 Oxygen saturations remain 90-95%    ADL  Grooming: Performed;Set up (visual cues question due to [facility]) Where Assessed - Grooming: Supine, head of bed up ADL Comments: Pt supine on arrival and aroused to name call. Pt washed face with warm wash cloth supine with OT request. Pt supine <> sit EOB with poor sitting balance initially. Pt progressed to [place] sitting EOB with BIL UE support and BIl feet on ground support. Pt stand pivot to the Rt side to chair. Pt with bed elevated and chair elevated with pillows. Pt without further needs sitting in chair.    OT Diagnosis: Generalized weakness  OT Problem List: Decreased strength;Decreased activity tolerance;Impaired balance (sitting and/or standing);Decreased safety awareness;Decreased knowledge of use of DME or AE;Decreased knowledge of precautions OT Treatment Interventions: Self-care/ADL training;Therapeutic exercise;DME and/or AE instruction;Patient/family education;Balance training   OT Goals Acute Rehab OT Goals OT Goal Formulation: Patient unable to participate in goal setting Time For Goal Achievement: 09/16/11 Potential to  Achieve Goals: Fair ADL Goals Pt Will Perform Grooming: with set-up;Sitting, chair;Supported ADL Goal: Grooming - Progress: Goal set today Pt Will Perform Upper Body Bathing: with set-up;Sitting, chair;Supported ADL Goal: Upper Body Bathing - Progress: Goal set today Pt Will Perform Upper Body Dressing: with set-up;Sitting, chair;Supported ADL Goal: Location manager Dressing - Progress: Goal set today Pt Will Transfer to Toilet: with mod assist;with DME;3-in-1;Stand pivot transfer ADL Goal: Toilet Transfer - Progress: Goal set today  Visit Information  Last OT Received On: 09/02/11 Assistance Needed: +2    Subjective Data  Subjective: "[place]" - pt states "i dont know why I am here" Patient Stated Goal: not able to participate   Prior Functioning  Home Living Available Help at Discharge: Skilled Nursing Facility Type of Home: Skilled Nursing Facility Home Adaptive Equipment: Wheelchair - manual Additional Comments: Pt living at [facility] for 5 years, daughter reports pt was receiving therapy prior to admission Prior Function Level of Independence: Needs assistance Needs Assistance: Transfers Transfer Assistance: Assist for transfers PTA at SNF, but able to progress towards Mod I when not recently hospitalized Able to Take Stairs?: No Driving: No Communication Communication: HOH Dominant Hand: Right    Cognition  Overall Cognitive Status: History of cognitive impairments - at baseline Arousal/Alertness: Awake/alert Orientation Level: Disoriented to;Time;Situation Behavior During Session: [facility] for tasks performed    Extremity/Trunk Assessment Right Upper Extremity Assessment RUE ROM/Strength/Tone: [facility] for tasks assessed Left Upper Extremity Assessment LUE ROM/Strength/Tone: WFL for tasks assessed Right Lower Extremity Assessment RLE ROM/Strength/Tone: Unable to fully assess;Due to impaired cognition RLE ROM/Strength/Tone Deficits: grossly at least 3+/5 per functional  observation Left Lower Extremity Assessment LLE ROM/Strength/Tone: Unable to fully assess;Due to impaired cognition LLE  ROM/Strength/Tone Deficits: grossly at least 3+/5 per functional observation Trunk Assessment Trunk Assessment: Normal   Mobility Bed Mobility Bed Mobility: Not assessed (Pt up in chair, wanting to stay in chair) Supine to Sit: 2: Max assist Details for Bed Mobility Assistance: required (A) to sequence task  Transfers Sit to Stand: 2: Max assist;From bed;With upper extremity assist Sit to Stand: Patient Percentage: 60% Stand to Sit: 1: +2 Total assist;With upper extremity assist;With armrests;To chair/3-in-1 Stand to Sit: Patient Percentage: 50%   Exercise    Balance    End of Session OT - End of Session Activity Tolerance: Patient tolerated treatment well Patient left: in chair;with call bell/phone within reach Nurse Communication: Mobility status (Recommend 2 person (A) )   Lucile Shutters 09/02/2011, 11:39 AM Pager: [phone]

## 2011-09-02 NOTE — Care Management Note (Signed)
    Page 1 of 1   09/02/2011     11:09:33 AM   CARE MANAGEMENT NOTE 09/02/2011  Patient:  [name], [name]   Account Number:  [id number]  Date Initiated:  09/02/2011  Documentation initiated by:  GRAVES-BIGELOW,Saahir Prude  Subjective/Objective Assessment:   Pt admitted with confusion. Pt is plan for d/c to  [facility] when stable. CSW working with pt.     Action/Plan:   No further needs for CM at this time.   Anticipated DC Date:  09/04/2011   Anticipated DC Plan:  SKILLED NURSING FACILITY      DC Planning Services  CM consult      Choice offered to / List presented to:             Status of service:  Completed, signed off Medicare Important Message given?   (If response is "NO", the following Medicare IM given date fields will be blank) Date Medicare IM given:   Date Additional Medicare IM given:    Discharge Disposition:  SKILLED NURSING FACILITY  Per UR Regulation:  Reviewed for med. necessity/level of care/duration of stay  If discussed at Long Length of Stay Meetings, dates discussed:    Comments:

## 2011-09-03 DIAGNOSIS — R4182 Altered mental status, unspecified: Secondary | ICD-10-CM

## 2011-09-03 DIAGNOSIS — E1169 Type 2 diabetes mellitus with other specified complication: Secondary | ICD-10-CM

## 2011-09-03 DIAGNOSIS — E871 Hypo-osmolality and hyponatremia: Secondary | ICD-10-CM

## 2011-09-03 DIAGNOSIS — Z8679 Personal history of other diseases of the circulatory system: Secondary | ICD-10-CM

## 2011-09-03 LAB — BASIC METABOLIC PANEL
Calcium: 9 mg/dL (ref 8.4–10.5)
Creatinine, Ser: 0.73 mg/dL (ref 0.50–1.10)
GFR calc Af Amer: 85 mL/min — ABNORMAL LOW (ref >90)
Sodium: 129 mEq/L — ABNORMAL LOW (ref 135–145)

## 2011-09-03 LAB — GLUCOSE, CAPILLARY: Glucose-Capillary: 250 mg/dL — ABNORMAL HIGH (ref 70–99)

## 2011-09-03 MED ORDER — INSULIN GLARGINE 100 UNIT/ML ~~LOC~~ SOLN
40.0000 [IU] | Freq: Every day | SUBCUTANEOUS | Status: DC
  Administered 2011-09-04: 40 [IU] via SUBCUTANEOUS

## 2011-09-03 NOTE — Progress Notes (Signed)
Subjective: Per daughter patient is Slowly more confused today than yesterday.  Her daughter had hallucinations about somebody visiting her from heaven.  Denies any other complaints.  Objective: Vital signs in last 24 hours: Filed Vitals:   09/03/11 0400 09/03/11 0555 09/03/11 0830 09/03/11 0857  BP: 174/72 172/58 153/68   Pulse: 64  64   Temp: 97 F (36.1 C)  96.8 F (36 C)   TempSrc: Oral  Axillary   Resp: 18  20   Height:      Weight: 81.2 kg (179 lb 0.2 oz)     SpO2: 97%  96% 99%   Weight change: -1.1 kg (-2 lb 6.8 oz)  Intake/Output Summary (Last 24 hours) at 09/03/11 1404 Last data filed at 09/03/11 0830  Gross per 24 hour  Intake    523 ml  Output    800 ml  Net   -277 ml    Physical Exam: General: Awake, Oriented only to self, No acute distress. HEENT: EOMI. Neck: Supple CV: S1 and S2 Lungs: Crackles at bases bilaterally Abdomen: Soft, Nontender, Nondistended, +bowel sounds. Ext: Good pulses. Trace edema.  Lab Results: Basic Metabolic Panel:  Lab 09/03/11 6222 09/02/11 0500 09/01/11 0530 08/31/11 0610 08/30/11 1719  NA 129* 127* 128* 130* 126*  K 3.8 3.5 3.6 3.7 4.0  CL 85* 84* 85* 85* 84*  CO2 35* 35* 37* 38* 35*  GLUCOSE 287* 236* 177* 70 107*  BUN 10 9 8 7 8   CREATININE 0.73 0.74 0.74 0.66 0.65  CALCIUM 9.0 8.8 8.7 8.8 9.3  MG -- -- -- -- --  PHOS -- -- -- -- --   Liver Function Tests:  Lab 08/30/11 1719  AST 14  ALT 19  ALKPHOS 111  BILITOT 0.4  PROT 6.8  ALBUMIN 3.5   No results found for this basename: LIPASE:5,AMYLASE:5 in the last 168 hours  Lab 08/30/11 2124  AMMONIA 30   CBC:  Lab 09/01/11 0530 08/31/11 0610 08/30/11 1719  WBC 6.9 7.8 9.9  NEUTROABS -- -- 7.2  HGB 10.5* 10.2* 11.4*  HCT 30.6* 31.1* 34.4*  MCV 90.5 88.9 88.9  PLT 237 239 237   Cardiac Enzymes:  Lab 08/31/11 1259 08/31/11 0610 08/30/11 2124 08/30/11 1720  CKTOTAL 23 22 35 --  CKMB 2.8 3.3 3.9 --  CKMBINDEX -- -- -- --  TROPONINI <0.30 <0.30 <0.30 <0.30    BNP (last 3 results)  Basename 08/30/11 1720 08/14/11 1756 06/21/11 2015  PROBNP 3591.0* 1067.0* 440.3   CBG:  Lab 09/03/11 1139 09/03/11 0742 09/02/11 2044 09/02/11 1652 09/02/11 1126  GLUCAP 222* 265* 289* 241* 265*   No results found for this basename: HGBA1C:5 in the last 72 hours Other Labs: No components found with this basename: POCBNP:3 No results found for this basename: DDIMER:2 in the last 168 hours No results found for this basename: CHOL:2,HDL:2,LDLCALC:2,TRIG:2,CHOLHDL:2,LDLDIRECT:2 in the last 168 hours No results found for this basename: TSH,T4TOTAL,FREET3,T3FREE,FREET4,THYROIDAB in the last 168 hours No results found for this basename: VITAMINB12:2,FOLATE:2,FERRITIN:2,TIBC:2,IRON:2,RETICCTPCT:2 in the last 168 hours  Micro Results: Recent Results (from the past 240 hour(s))  MRSA PCR SCREENING     Status: Normal   Collection Time   08/30/11 11:34 PM      Component Value Range Status Comment   MRSA by PCR NEGATIVE  NEGATIVE Final     Studies/Results: No results found.  Medications: I have reviewed the patient's current medications. Scheduled Meds:   . antiseptic oral rinse  15 mL Mouth Rinse  BID  . aspirin  81 mg Oral Daily  . atorvastatin  10 mg Oral q1800  . demeclocycline  300 mg Oral Q6H  . diltiazem  180 mg Oral Daily  . enoxaparin (LOVENOX) injection  40 mg Subcutaneous Q24H  . feeding supplement  237 mL Oral TID BM  . furosemide  60 mg Oral BID  . hydrALAZINE  50 mg Oral Q6H  . insulin aspart  0-15 Units Subcutaneous TID WC  . insulin glargine  25 Units Subcutaneous Daily  . ipratropium  0.5 mg Nebulization TID  . levalbuterol  0.63 mg Nebulization TID  . levothyroxine  100 mcg Oral QAC breakfast  . losartan  100 mg Oral Daily  . metoprolol  50 mg Oral BID  . sodium chloride  3 mL Intravenous Q12H  . sodium chloride  3 mL Intravenous Q12H   Continuous Infusions:  PRN Meds:.sodium chloride, hydrALAZINE, sodium  chloride  Assessment/Plan: Hypoglycemia in DM 2 Stable. Blood sugars improved.  Continue once 25 units daily.  Continue sliding scale insulin.  Hyponatremia  Improving with diuresis, likely 2/2 CHF, continue fluid restriction and 1200 cc. Cortisol in the morning 14.3 appropriate. Continue demeclocycline.  COPD with CO2 retention (chronic obstructive pulmonary disease)  Likely precipitated by xanax, which has been discontinued.  Continue neb treatments.  Metabolic encephalopathy Multifactorial,Likely due to hyponatremia and CO2 retention.  Patient is at baseline.  Head CT obtained on admission on 08/30/2011 was negative for acute intracranial events.  Diastolic CHF, chronic Continue furosemide 60 mg twice daily (home dose 40 mg twice daily), continue beta blocker. Troponin negative x4. Admission weight 85.6 kg. Patient's weight on discharge was 80.3 kg on 08/21/2011. Today's weight 81.2 kg.  Hypertension  Slightly elevated but continue metoprolol, losartan, metoprolol, ditiazem at current doses.  History of paroxysmal atrial fibrillation during her last admission  Not a candidate for anticoagulation, and family does not want her to be on anticoagulation continue metoprolol diltiazem   CAD (coronary artery disease) History of CVA (cerebrovascular accident), continue aspirin   Dysphagia  Per speech therapy evaluation, Dysphagia 3 (Mechanical Soft);Thin liquid and fluid restriction.  Disposition Patient's mentation is stable consider discharge in 1-2 days.   LOS: 4 days  Takuya Lariccia A, MD 09/03/2011, 2:04 PM

## 2011-09-03 NOTE — Progress Notes (Signed)
Inpatient Diabetes Program Recommendations  AACE/ADA: New Consensus Statement on Inpatient Glycemic Control (2009)  Target Ranges:  Prepandial:   less than 140 mg/dL      Peak postprandial:   less than 180 mg/dL (1-2 hours)      Critically ill patients:  140 - 180 mg/dL    Results for DEONI, COSEY (MRN [id number]) as of 09/03/2011 12:22  Ref. Range 09/02/2011 07:40 09/02/2011 11:26 09/02/2011 16:52 09/02/2011 20:44  Glucose-Capillary Latest Range: 70-99 mg/dL 409 (H) 811 (H) 914 (H) 289 (H)    Results for RASHEDA, LEDGER (MRN [id number]) as of 09/03/2011 12:22  Ref. Range 09/03/2011 07:42 09/03/2011 11:39  Glucose-Capillary Latest Range: 70-99 mg/dL 086 (H) 578 (H)     Inpatient Diabetes Program Recommendations Insulin - Basal: Please increase Lantus to 40 units daily (patient's home dose of Lantus is 58 units QHS).  Note: Will follow. Ambrose Finland RN, MSN, CDE Diabetes Coordinator Inpatient Diabetes Program [phone]

## 2011-09-03 NOTE — Progress Notes (Signed)
RN informed by pt daughter that her mother had voiced delusions of  relatives in heaven visiting the patient last night. Pt voiced that the relatives would come "in and out" a few times. Dr. Betti Cruz notified of delusions. Will continue to monitor. Ramond Craver, RN

## 2011-09-03 NOTE — Progress Notes (Signed)
Pt arrived to floor. No acute distress. VSS. No pain. Alert and oriented to self only. Daughter at bedside. Plan to continue to monitor.

## 2011-09-04 ENCOUNTER — Inpatient Hospital Stay (HOSPITAL_COMMUNITY): Payer: Medicare Other

## 2011-09-04 DIAGNOSIS — E871 Hypo-osmolality and hyponatremia: Secondary | ICD-10-CM

## 2011-09-04 DIAGNOSIS — R4182 Altered mental status, unspecified: Secondary | ICD-10-CM

## 2011-09-04 DIAGNOSIS — E1169 Type 2 diabetes mellitus with other specified complication: Secondary | ICD-10-CM

## 2011-09-04 DIAGNOSIS — Z8679 Personal history of other diseases of the circulatory system: Secondary | ICD-10-CM

## 2011-09-04 LAB — BASIC METABOLIC PANEL
BUN: 11 mg/dL (ref 6–23)
Chloride: 83 mEq/L — ABNORMAL LOW (ref 96–112)
GFR calc Af Amer: 83 mL/min — ABNORMAL LOW (ref >90)
GFR calc non Af Amer: 72 mL/min — ABNORMAL LOW (ref >90)
Glucose, Bld: 207 mg/dL — ABNORMAL HIGH (ref 70–99)
Potassium: 3.4 mEq/L — ABNORMAL LOW (ref 3.5–5.1)
Sodium: 126 mEq/L — ABNORMAL LOW (ref 135–145)

## 2011-09-04 LAB — URINALYSIS, ROUTINE W REFLEX MICROSCOPIC
Bilirubin Urine: NEGATIVE
Glucose, UA: NEGATIVE mg/dL
Hgb urine dipstick: NEGATIVE
Ketones, ur: NEGATIVE mg/dL
Nitrite: NEGATIVE
Protein, ur: 100 mg/dL — AB
Specific Gravity, Urine: 1.013 (ref 1.005–1.030)
Urobilinogen, UA: 0.2 mg/dL (ref 0.0–1.0)
pH: 8 (ref 5.0–8.0)

## 2011-09-04 LAB — URINE MICROSCOPIC-ADD ON

## 2011-09-04 LAB — GLUCOSE, CAPILLARY
Glucose-Capillary: 272 mg/dL — ABNORMAL HIGH (ref 70–99)
Glucose-Capillary: 165 mg/dL — ABNORMAL HIGH (ref 70–99)

## 2011-09-04 MED ORDER — SENNOSIDES-DOCUSATE SODIUM 8.6-50 MG PO TABS
1.0000 | ORAL_TABLET | Freq: Two times a day (BID) | ORAL | Status: DC
  Administered 2011-09-04 – 2011-09-08 (×9): 1 via ORAL
  Filled 2011-09-04 (×11): qty 1

## 2011-09-04 MED ORDER — ONDANSETRON HCL 4 MG/2ML IJ SOLN
4.0000 mg | Freq: Four times a day (QID) | INTRAMUSCULAR | Status: DC | PRN
  Administered 2011-09-04: 4 mg via INTRAVENOUS
  Filled 2011-09-04: qty 2

## 2011-09-04 MED ORDER — HYDRALAZINE HCL 50 MG PO TABS
75.0000 mg | ORAL_TABLET | Freq: Four times a day (QID) | ORAL | Status: DC
  Administered 2011-09-04 – 2011-09-07 (×11): 75 mg via ORAL
  Filled 2011-09-04 (×16): qty 1

## 2011-09-04 MED ORDER — CEFTRIAXONE SODIUM 1 G IJ SOLR
1.0000 g | INTRAMUSCULAR | Status: DC
  Administered 2011-09-04 – 2011-09-05 (×2): 1 g via INTRAVENOUS
  Filled 2011-09-04 (×2): qty 10

## 2011-09-04 MED ORDER — BISACODYL 10 MG RE SUPP: 10.0000 mg | Freq: Every day | RECTAL | Status: DC | PRN

## 2011-09-04 MED ORDER — INSULIN GLARGINE 100 UNIT/ML ~~LOC~~ SOLN
50.0000 [IU] | Freq: Every day | SUBCUTANEOUS | Status: DC
  Administered 2011-09-05 – 2011-09-06 (×2): 50 [IU] via SUBCUTANEOUS
  Administered 2011-09-07: 11:00:00 via SUBCUTANEOUS
  Administered 2011-09-08: 50 [IU] via SUBCUTANEOUS

## 2011-09-04 MED ORDER — POLYETHYLENE GLYCOL 3350 17 G PO PACK
17.0000 g | PACK | Freq: Every day | ORAL | Status: DC
  Administered 2011-09-04 – 2011-09-08 (×4): 17 g via ORAL
  Filled 2011-09-04 (×5): qty 1

## 2011-09-04 MED ORDER — POTASSIUM CHLORIDE CRYS ER 20 MEQ PO TBCR
20.0000 meq | EXTENDED_RELEASE_TABLET | Freq: Once | ORAL | Status: AC
  Administered 2011-09-04: 20 meq via ORAL
  Filled 2011-09-04: qty 1

## 2011-09-04 NOTE — Progress Notes (Signed)
09/04/2011 2:29 AM Foley cath d/c'd last evening with no void > 6hrs. Family feels as though confusion is increasing. Bladder scan reveals in bladder.  I/O cath done with 350cc yellow urine returned. No odor. Vagina is noted to be red with a moderate amount of white discharge. Family requesting a u/a be sent to test for UTI. Family member feels that the increase in confusion may be due to UTI. MD on call made aware and sample saved. Will continue to monitor closely. Hively, Avie Echevaria, RN

## 2011-09-04 NOTE — Progress Notes (Signed)
Subjective: Pleasant this morning, recognized my name.  Feeling weak.  Objective: Vital signs in last 24 hours: Filed Vitals:   09/04/11 0511 09/04/11 0828 09/04/11 1000 09/04/11 1100  BP:   184/50 174/65  Pulse:   71 76  Temp:   98.6 F (37 C) 98.6 F (37 C)  TempSrc:   Oral Oral  Resp:   20 18  Height:      Weight:      SpO2: 96% 94% 96% 96%   Weight change: -1.4 kg (-3 lb 1.4 oz)  Intake/Output Summary (Last 24 hours) at 09/04/11 1216 Last data filed at 09/04/11 0900  Gross per 24 hour  Intake    360 ml  Output    750 ml  Net   -390 ml    Physical Exam: General: Awake, Oriented only to self, No acute distress. HEENT: EOMI. Neck: Supple CV: S1 and S2 Lungs: Crackles at bases bilaterally Abdomen: Soft, Nontender, slightly distended, +bowel sounds. Ext: Good pulses. Trace edema.  Lab Results: Basic Metabolic Panel:  Lab 09/04/11 1610 09/03/11 0500 09/02/11 0500 09/01/11 0530 08/31/11 0610  NA 126* 129* 127* 128* 130*  K 3.4* 3.8 3.5 3.6 3.7  CL 83* 85* 84* 85* 85*  CO2 34* 35* 35* 37* 38*  GLUCOSE 207* 287* 236* 177* 70  BUN 11 10 9 8 7   CREATININE 0.77 0.73 0.74 0.74 0.66  CALCIUM 8.9 9.0 8.8 8.7 8.8  MG -- -- -- -- --  PHOS -- -- -- -- --   Liver Function Tests:  Lab 08/30/11 1719  AST 14  ALT 19  ALKPHOS 111  BILITOT 0.4  PROT 6.8  ALBUMIN 3.5   No results found for this basename: LIPASE:5,AMYLASE:5 in the last 168 hours  Lab 08/30/11 2124  AMMONIA 30   CBC:  Lab 09/01/11 0530 08/31/11 0610 08/30/11 1719  WBC 6.9 7.8 9.9  NEUTROABS -- -- 7.2  HGB 10.5* 10.2* 11.4*  HCT 30.6* 31.1* 34.4*  MCV 90.5 88.9 88.9  PLT 237 239 237   Cardiac Enzymes:  Lab 08/31/11 1259 08/31/11 0610 08/30/11 2124 08/30/11 1720  CKTOTAL 23 22 35 --  CKMB 2.8 3.3 3.9 --  CKMBINDEX -- -- -- --  TROPONINI <0.30 <0.30 <0.30 <0.30   BNP (last 3 results)  Basename 08/30/11 1720 08/14/11 1756 06/21/11 2015  PROBNP 3591.0* 1067.0* 440.3   CBG:  Lab 09/04/11  1123 09/04/11 0825 09/03/11 2119 09/03/11 1605 09/03/11 1139  GLUCAP 331* 272* 250* 212* 222*   No results found for this basename: HGBA1C:5 in the last 72 hours Other Labs: No components found with this basename: POCBNP:3 No results found for this basename: DDIMER:2 in the last 168 hours No results found for this basename: CHOL:2,HDL:2,LDLCALC:2,TRIG:2,CHOLHDL:2,LDLDIRECT:2 in the last 168 hours No results found for this basename: TSH,T4TOTAL,FREET3,T3FREE,FREET4,THYROIDAB in the last 168 hours No results found for this basename: VITAMINB12:2,FOLATE:2,FERRITIN:2,TIBC:2,IRON:2,RETICCTPCT:2 in the last 168 hours  Micro Results: Recent Results (from the past 240 hour(s))  MRSA PCR SCREENING     Status: Normal   Collection Time   08/30/11 11:34 PM      Component Value Range Status Comment   MRSA by PCR NEGATIVE  NEGATIVE Final     Studies/Results: No results found.  Medications: I have reviewed the patient's current medications. Scheduled Meds:    . antiseptic oral rinse  15 mL Mouth Rinse BID  . aspirin  81 mg Oral Daily  . atorvastatin  10 mg Oral q1800  . cefTRIAXone (  ROCEPHIN)  IV  1 g Intravenous Q24H  . demeclocycline  300 mg Oral Q6H  . diltiazem  180 mg Oral Daily  . enoxaparin (LOVENOX) injection  40 mg Subcutaneous Q24H  . feeding supplement  237 mL Oral TID BM  . furosemide  60 mg Oral BID  . hydrALAZINE  50 mg Oral Q6H  . insulin aspart  0-15 Units Subcutaneous TID WC  . insulin glargine  40 Units Subcutaneous Daily  . ipratropium  0.5 mg Nebulization TID  . levalbuterol  0.63 mg Nebulization TID  . levothyroxine  100 mcg Oral QAC breakfast  . losartan  100 mg Oral Daily  . metoprolol  50 mg Oral BID  . potassium chloride  20 mEq Oral Once  . sodium chloride  3 mL Intravenous Q12H  . sodium chloride  3 mL Intravenous Q12H  . DISCONTD: insulin glargine  25 Units Subcutaneous Daily   Continuous Infusions:  PRN Meds:.sodium chloride, hydrALAZINE, sodium  chloride  Assessment/Plan: DM 2 now Hyperglycemia Stable. Blood sugars now high.  Increase lantus 50 units SQ qhs. Continue sliding scale insulin.  Hyponatremia  Worse again today.  Requested renal consultation. Demeclocycline discontinued, by renal. Likely due diastolic CHF.  Continue fluid restriction 1.5 L. All psych medications and Losartan discontinued as it can cause hyponatremia.   COPD with CO2 retention (chronic obstructive pulmonary disease)  Likely precipitated by xanax, which has been discontinued.  Continue neb treatments.  Metabolic encephalopathy Multifactorial,Likely due to hyponatremia and CO2 retention.  Patient is at baseline.  Head CT obtained on admission on 08/30/2011 was negative for acute intracranial events.  Diastolic CHF, chronic Continue furosemide 60 mg twice daily (home dose 40 mg twice daily), continue beta blocker. Troponin negative x4. Admission weight 85.6 kg. Patient's weight on discharge was 80.3 kg on 08/21/2011. Today's weight 79.8 kg. Continue diuresis.  Hypertension  Increased hydralazine, continue metoprolol, metoprolol, ditiazem at current doses.  History of paroxysmal atrial fibrillation during her last admission  Not a candidate for anticoagulation, and family does not want her to be on anticoagulation continue metoprolol diltiazem   CAD (coronary artery disease) History of CVA (cerebrovascular accident), continue aspirin   Dysphagia  Per speech therapy evaluation, Dysphagia 3 (Mechanical Soft);Thin liquid and fluid restriction.  UTI? Urinalysis checked on 09/04/2011 showed moderate leukocytes with 21-50 WBCs and many bacteria.  Start ceftriaxone, follow urine culture. History of ESBL.  Constipation Start the patient on a bowel regimen.  Hypokalemia Replace as needed.  Disposition Pending improvement in sodium.   LOS: 5 days  Feiga Nadel A, MD 09/04/2011, 12:16 PM

## 2011-09-04 NOTE — Progress Notes (Signed)
Occupational Therapy Treatment Patient Details Name: [name] MRN: [id number] DOB: [birth year] Today's Date: 09/04/2011 Time: 4098-1191 OT Time Calculation (min): 22 min  OT Assessment / Plan / Recommendation Comments on Treatment Session Pt. completed EOB activities with min assist to maintain upright posture during bil UE reaching activities.    Follow Up Recommendations  Skilled nursing facility       Equipment Recommendations  Defer to next venue       Frequency Min 2X/week   Plan Discharge plan remains appropriate    Precautions / Restrictions Precautions Precautions: Fall Restrictions Weight Bearing Restrictions: No       ADL  Grooming: Performed;Set up Where Assessed - Grooming: Supine, head of bed up ADL Comments: Pt. completed EOB bil UE reaching activities crossing midline with min assist to maintain upright position due to posterior lean ~92mins.      OT Goals Acute Rehab OT Goals OT Goal Formulation: Patient unable to participate in goal setting Time For Goal Achievement: 09/16/11 Potential to Achieve Goals: Fair ADL Goals Pt Will Perform Grooming: with set-up;Sitting, chair;Supported ADL Goal: Grooming - Progress: Progressing toward goals  Visit Information  Last OT Received On: 09/04/11 Assistance Needed: +2          Cognition  Overall Cognitive Status: History of cognitive impairments - at baseline Arousal/Alertness: Awake/alert Orientation Level: Disoriented to;Time;Situation Behavior During Session: WFL for tasks performed Cognition - Other Comments: Pt. has been seeing people who have been dead for years and making random comments, RN and MD aware and possible UTI?    Mobility Bed Mobility Bed Mobility: Rolling Right;Right Sidelying to Sit;Sitting - Scoot to Edge of Bed Rolling Right: 5: Supervision;With rail Right Sidelying to Sit: 3: Mod assist Sitting - Scoot to Edge of Bed: 2: Max assist Details for Bed Mobility Assistance: pt  bridged knees with vc's and performed roll with rail, needed mod A for transfer to sitting and then would not let go of therapist.  Followed commands inconsistently.    Transfers Transfers: Sit to Stand Sit to Stand: 1: +2 Total assist;From bed Sit to Stand: Patient Percentage: 30% Details for Transfer Assistance: Bil feet blocked and knees to prevent buckling         End of Session OT - End of Session Activity Tolerance: Patient tolerated treatment well Patient left: in bed;with call bell/phone within reach Nurse Communication: Mobility status   Stiven Kaspar, OTR/L Pager [phone] 09/04/2011, 2:12 PM

## 2011-09-04 NOTE — Consult Note (Signed)
Reason for Consult:hyponatremia Referring Physician: Sheriden Archibeque is an 78 y.o. female.  HPI: Pt is a 78 yo WF with an extensive PMH most notable for CVA, dementia, CAD, diastolic CHF, DM, COPD, hypothyroidism, and multiple admissions for AMS and hyponatremia over the last 3 months.  Pt was admitted on 08/30/11 with a similar presentation of AMS and found to have a serum Na of 126.  Pt was also found to have CHF and was diuresed with IV lasix.  Her serum Na improved to 130 the day after admission but has remained below that since.  She had been treated with samsca during her last admission and was started on demeclocycline during this stay.  We were asked to help further eval and manage her persistent hyponatremia.  The trend in sodium is as follows:  Trends in Serum Sodium: Sodium  Date/Time Value Range Status  09/04/2011  5:20 AM 126* 135 - 145 mEq/L Final  09/03/2011  5:00 AM 129* 135 - 145 mEq/L Final  09/02/2011  5:00 AM 127* 135 - 145 mEq/L Final  09/01/2011  5:30 AM 128* 135 - 145 mEq/L Final  08/31/2011  6:10 AM 130* 135 - 145 mEq/L Final  08/30/2011  5:19 PM 126* 135 - 145 mEq/L Final  08/21/2011  9:08 AM 133* 135 - 145 mEq/L Final  08/20/2011  4:20 AM 136  135 - 145 mEq/L Final  08/18/2011  5:17 PM 133* 135 - 145 mEq/L Final  08/17/2011 12:45 PM 131* 135 - 145 mEq/L Final  08/17/2011  5:25 AM 128* 135 - 145 mEq/L Final  08/16/2011  5:32 AM 127* 135 - 145 mEq/L Final  08/15/2011  5:29 PM 124* 135 - 145 mEq/L Final  08/15/2011 11:05 AM 120* 135 - 145 mEq/L Final  08/15/2011  5:15 AM 120* 135 - 145 mEq/L Final  08/15/2011 12:13 AM 124* 135 - 145 mEq/L Final  08/15/2011 12:00 AM 124* 135 - 145 mEq/L Final  08/14/2011  5:56 PM 120* 135 - 145 mEq/L Final  06/27/2011  6:25 AM 137  135 - 145 mEq/L Final  06/25/2011  6:16 AM 136  135 - 145 mEq/L Final  06/24/2011  6:50 AM 134* 135 - 145 mEq/L Final  06/23/2011  6:00 AM 136  135 - 145 mEq/L Final  06/22/2011  6:50 AM 133* 135 - 145 mEq/L Final  06/21/2011   8:15 PM 132* 135 - 145 mEq/L Final  02/02/2011  1:01 AM 137  135 - 145 mEq/L Final  09/11/2008  4:56 AM 139  135 - 145 mEq/L Final  09/10/2008  4:20 AM 138  135 - 145 mEq/L Final  09/09/2008  5:00 AM 134* 135 - 145 mEq/L Final  09/08/2008  1:50 PM 135  135 - 145 mEq/L Final  04/19/2007  5:20 AM 131*  Final  04/18/2007  3:30 AM 135   Final  04/17/2007  5:35 AM 134*  Final  04/16/2007  3:45 AM 132*  Final  04/15/2007  3:15 AM 130*  Final  04/14/2007  4:00 AM 130*  Final  04/13/2007  3:15 AM 126*  Final  04/12/2007  3:30 PM 123*  Final   PMH:   Past Medical History  Diagnosis Date  . Dysphagia   . Diabetes mellitus   . Hypertension   . Hypothyroidism   . Hyperlipemia   . GERD (gastroesophageal reflux disease)   . Depression   . Anxiety   . Acute MI   . Pneumonia   .  Stroke   . UTI (urinary tract infection)   . COPD (chronic obstructive pulmonary disease)   . CAD (coronary artery disease)   . Late effects of CVA (cerebrovascular accident)   . DEMENTIA     PSH:   Past Surgical History  Procedure Date  . Back surgery     Allergies: No Known Allergies  Medications:   Prior to Admission medications   Medication Sig Start Date End Date Taking? Authorizing Provider  acetaminophen (TYLENOL) 325 MG tablet Take 650 mg by mouth every 4 (four) hours as needed. For pain.   Yes Historical Provider, MD  albuterol (PROVENTIL) (5 MG/ML) 0.5% nebulizer solution Take 0.5 mLs (2.5 mg total) by nebulization every 2 (two) hours as needed for wheezing or shortness of breath. 06/26/11 06/25/12 Yes  Art, DO  ALPRAZolam (XANAX) 0.25 MG tablet Take 0.25 mg by mouth 2 (two) times daily.     Yes Historical Provider, MD  aspirin 81 MG chewable tablet Chew 81 mg by mouth daily.   Yes Historical Provider, MD  beta carotene w/minerals (OCUVITE) tablet Take 1 tablet by mouth daily.     Yes Historical Provider, MD  diltiazem (CARDIZEM CD) 180 MG 24 hr capsule Take 180 mg by mouth daily.   Yes Historical  Provider, MD  DULoxetine (CYMBALTA) 30 MG capsule Take 30 mg by mouth at bedtime.     Yes Historical Provider, MD  furosemide (LASIX) 40 MG tablet Take 40 mg by mouth 2 (two) times daily.   Yes Historical Provider, MD  Glucose LIQD Take 15 g by mouth as needed. If CBG < 60.   Yes Historical Provider, MD  hydrALAZINE (APRESOLINE) 50 MG tablet Take 50 mg by mouth every 6 (six) hours.   Yes Historical Provider, MD  insulin aspart (NOVOLOG) 100 UNIT/ML injection Inject 4 Units into the skin 3 (three) times daily with meals.    Yes Historical Provider, MD  insulin glargine (LANTUS) 100 UNIT/ML injection Inject 58 Units into the skin at bedtime. 06/28/11 06/27/12 Yes Jessica U Vann, DO  ipratropium (ATROVENT) 0.02 % nebulizer solution Take 0.5 mg by nebulization every 2 (two) hours as needed. For shortness of breath. 06/26/11 06/25/12 Yes Jessica U Vann, DO  lactulose (CHRONULAC) 10 GM/15ML solution Take 20 g by mouth 2 (two) times daily.   Yes Historical Provider, MD  levothyroxine (SYNTHROID, LEVOTHROID) 100 MCG tablet Take 100 mcg by mouth daily before breakfast.   Yes Historical Provider, MD  losartan (COZAAR) 100 MG tablet Take 100 mg by mouth daily.     Yes Historical Provider, MD  metFORMIN (GLUCOPHAGE) 500 MG tablet Take 500 mg by mouth 2 (two) times daily with a meal.     Yes Historical Provider, MD  metoprolol (LOPRESSOR) 50 MG tablet Take 50 mg by mouth 2 (two) times daily.     Yes Historical Provider, MD  omeprazole (PRILOSEC) 20 MG capsule Take 20 mg by mouth 2 (two) times daily.     Yes Historical Provider, MD  polyethylene glycol (MIRALAX / GLYCOLAX) packet Take 17 g by mouth daily.     Yes Historical Provider, MD  PRESCRIPTION MEDICATION Take 1 tablet by mouth daily. K-Dur 10 meq  dissolvable.   Yes Historical Provider, MD  simvastatin (ZOCOR) 20 MG tablet Take 20 mg by mouth daily.     Yes Historical Provider, MD    Discontinued Meds:   Medications Discontinued During This Encounter    Medication Reason  . levothyroxine (SYNTHROID, LEVOTHROID)  100 MCG tablet Inpatient Standard  . furosemide (LASIX) 40 MG tablet Inpatient Standard  . hydrALAZINE (APRESOLINE) 50 MG tablet Inpatient Standard  . diltiazem (CARDIZEM CD) 180 MG 24 hr capsule Inpatient Standard  . lactulose (CHRONULAC) 10 GM/15ML solution Inpatient Standard  . ondansetron (ZOFRAN) injection 4 mg   . feeding supplement (ENSURE COMPLETE) LIQD Inpatient Standard  . simvastatin (ZOCOR) tablet 20 mg Formulary change  . ipratropium (ATROVENT) nebulizer solution 0.5 mg   . DULoxetine (CYMBALTA) DR capsule 30 mg   . furosemide (LASIX) injection 40 mg   . furosemide (LASIX) tablet 40 mg   . insulin glargine (LANTUS) injection 25 Units   . hydrALAZINE (APRESOLINE) tablet 50 mg     Social History:  reports that she has never smoked. She has never used smokeless tobacco. She reports that she does not drink alcohol or use illicit drugs.  Family History:   Family History  Problem Relation Age of Onset  . Hypertension Sister   . Hypertension Brother     Review of systems not obtained due to patient factors.  Blood pressure 174/65, pulse 76, temperature 98.6 F (37 C), temperature source Oral, resp. rate 18, height 5\' 9"  (1.753 m), weight 79.8 kg (175 lb 14.8 oz), SpO2 96.00%. General appearance: appears stated age, fatigued, mild distress and slowed mentation Head: Normocephalic, without obvious abnormality, atraumatic Eyes: negative findings: lids and lashes normal, conjunctivae and sclerae normal, corneas clear and pupils equal, round, reactive to light and accomodation Neck: no adenopathy, no carotid bruit, no JVD, supple, symmetrical, trachea midline and thyroid not enlarged, symmetric, no tenderness/mass/nodules Resp: rales bibasilar Cardio: regular rate and rhythm, S1, S2 normal, no murmur, click, rub or gallop GI: abnormal findings:  distended and enlarged bladder Extremities: extremities normal,  atraumatic, no cyanosis or edema Neurologic: Mental status: patient verbal but not able to answer simple questions or follow commands  Labs: Basic Metabolic Panel:  Lab 09/04/11 1610 09/03/11 0500 09/02/11 0500 09/01/11 0530 08/31/11 0610 08/30/11 1719  NA 126* 129* 127* 128* 130* 126*  K 3.4* 3.8 3.5 3.6 3.7 4.0  CL 83* 85* 84* 85* 85* 84*  CO2 34* 35* 35* 37* 38* 35*  GLUCOSE 207* 287* 236* 177* 70 107*  BUN 11 10 9 8 7 8   CREATININE 0.77 0.73 0.74 0.74 0.66 0.65  ALBUMIN -- -- -- -- -- 3.5  CALCIUM 8.9 9.0 8.8 8.7 8.8 9.3  PHOS -- -- -- -- -- --   Liver Function Tests:  Lab 08/30/11 1719  AST 14  ALT 19  ALKPHOS 111  BILITOT 0.4  PROT 6.8  ALBUMIN 3.5   No results found for this basename: LIPASE:3,AMYLASE:3 in the last 168 hours  Lab 08/30/11 2124  AMMONIA 30   CBC:  Lab 09/01/11 0530 08/31/11 0610 08/30/11 1719  WBC 6.9 7.8 9.9  NEUTROABS -- -- 7.2  HGB 10.5* 10.2* 11.4*  HCT 30.6* 31.1* 34.4*  MCV 90.5 88.9 88.9  PLT 237 239 237   PT/INR: @labrcntip (inr:5) Cardiac Enzymes:  Lab 08/31/11 1259 08/31/11 0610 08/30/11 2124 08/30/11 1720  CKTOTAL 23 22 35 --  CKMB 2.8 3.3 3.9 --  CKMBINDEX -- -- -- --  TROPONINI <0.30 <0.30 <0.30 <0.30   CBG:  Lab 09/04/11 1123 09/04/11 0825 09/03/11 2119 09/03/11 1605 09/03/11 1139  GLUCAP 331* 272* 250* 212* 222*    Iron Studies: No results found for this basename: IRON:30,TIBC:30,TRANSFERRIN:30,FERRITIN:30 in the last 168 hours  Xrays/Other Studies: No results found.   Assessment/Plan:  1.  Hyponatremia- pt with CHF as well as poorly controlled DM.  Agree with stopping psych meds and demeclocycline.  Need better glucose control.  Will check FeNa as pt may be overdiuresed.  Would also stop cozaar given active diuresis.  Will also check TSH.  Would also consider repeating CXR to r/o PNA or malignancy given recurrent hyponatremia.   Will recheck Uosm and Sosm tomorrow off of demeclocycline and consider samsca if c/w  SIADH, however at present most consistent with diastolic CHF. 2. Hyperglycemia- increase insulin per primary service 3. HTN- on dilt, metoprolol, hydralazine, cozaar.  Would consider changing dilt to amlodipine 10mg , stopping cozaar, and adding doxazosin 4mg  qhs.  Continue with lasix 4. Urinary retention- bladder scan with >500cc.  Will order I&O cath and check bladder scan q8 and I&O if >200 cc PVR 5. UTI- on rocephin. 6. AMS- ?dementia or chronic illness/metabolic encephelopathy 7. dispo- poor overall prognosis 8. Anemia- ? Due to lab draws.  Guaiac stools, check SPEP/UPEP per primary svc and iron stores   Freddy Kinne A 09/04/2011, 1:50 PM

## 2011-09-04 NOTE — Progress Notes (Signed)
Pt I & O cath per MD order post bladder scan of PVR = 500. Pt with episode of urinary incontinence prior to cath unable to count that amount. 425 cc produced per I&O cath.

## 2011-09-05 DIAGNOSIS — E871 Hypo-osmolality and hyponatremia: Secondary | ICD-10-CM

## 2011-09-05 DIAGNOSIS — R4182 Altered mental status, unspecified: Secondary | ICD-10-CM

## 2011-09-05 DIAGNOSIS — Z8679 Personal history of other diseases of the circulatory system: Secondary | ICD-10-CM

## 2011-09-05 DIAGNOSIS — E1169 Type 2 diabetes mellitus with other specified complication: Secondary | ICD-10-CM

## 2011-09-05 LAB — GLUCOSE, CAPILLARY
Glucose-Capillary: 213 mg/dL — ABNORMAL HIGH (ref 70–99)
Glucose-Capillary: 99 mg/dL (ref 70–99)
Glucose-Capillary: 201 mg/dL — ABNORMAL HIGH (ref 70–99)

## 2011-09-05 LAB — URINE CULTURE
Colony Count: NO GROWTH
Culture  Setup Time: 201306130820
Culture: NO GROWTH

## 2011-09-05 LAB — BASIC METABOLIC PANEL
CO2: 34 mEq/L — ABNORMAL HIGH (ref 19–32)
Calcium: 9.1 mg/dL (ref 8.4–10.5)
GFR calc non Af Amer: 55 mL/min — ABNORMAL LOW (ref >90)
Glucose, Bld: 82 mg/dL (ref 70–99)
Potassium: 3.5 mEq/L (ref 3.5–5.1)
Sodium: 129 mEq/L — ABNORMAL LOW (ref 135–145)

## 2011-09-05 LAB — SODIUM, URINE, RANDOM: Sodium, Ur: 77 mEq/L

## 2011-09-05 LAB — RENAL FUNCTION PANEL
Albumin: 3.1 g/dL — ABNORMAL LOW (ref 3.5–5.2)
BUN: 14 mg/dL (ref 6–23)
CO2: 35 mEq/L — ABNORMAL HIGH (ref 19–32)
Chloride: 84 mEq/L — ABNORMAL LOW (ref 96–112)
Creatinine, Ser: 0.83 mg/dL (ref 0.50–1.10)
Potassium: 3.8 mEq/L (ref 3.5–5.1)

## 2011-09-05 LAB — OSMOLALITY: Osmolality: 267 mOsm/kg — ABNORMAL LOW (ref 275–300)

## 2011-09-05 LAB — OSMOLALITY, URINE: Osmolality, Ur: 341 mOsm/kg — ABNORMAL LOW (ref 390–1090)

## 2011-09-05 LAB — CREATININE, URINE, RANDOM: Creatinine, Urine: 42.08 mg/dL

## 2011-09-05 MED ORDER — TOLVAPTAN 15 MG PO TABS
15.0000 mg | ORAL_TABLET | ORAL | Status: DC
  Administered 2011-09-05: 15 mg via ORAL
  Filled 2011-09-05 (×2): qty 1

## 2011-09-05 MED ORDER — LEVALBUTEROL HCL 0.63 MG/3ML IN NEBU
0.6300 mg | INHALATION_SOLUTION | Freq: Three times a day (TID) | RESPIRATORY_TRACT | Status: DC
  Administered 2011-09-05: 0.63 mg via RESPIRATORY_TRACT
  Filled 2011-09-05 (×2): qty 3

## 2011-09-05 MED ORDER — CEFTRIAXONE SODIUM 1 G IJ SOLR
1.0000 g | INTRAMUSCULAR | Status: AC
  Administered 2011-09-06: 1 g via INTRAVENOUS
  Filled 2011-09-05: qty 10

## 2011-09-05 NOTE — Progress Notes (Signed)
Physical Therapy Treatment Patient Details Name: [name] MRN: [id number] DOB: 07-22-1918 Today's Date: 09/05/2011 Time: 7628-3151 PT Time Calculation (min): 19 min  PT Assessment / Plan / Recommendation Comments on Treatment Session  Pt. gradually progressing in her mobility.  Heavy posterior lean in standing.    Follow Up Recommendations  Skilled nursing facility;Supervision/Assistance - 24 hour    Barriers to Discharge        Equipment Recommendations  Defer to next venue    Recommendations for Other Services    Frequency Min 3X/week   Plan Discharge plan remains appropriate;Frequency remains appropriate    Precautions / Restrictions Precautions Precautions: Fall Restrictions Weight Bearing Restrictions: No   Pertinent Vitals/Pain Denies pain; no distress    Mobility  Bed Mobility Bed Mobility: Rolling Right;Right Sidelying to Sit;Sitting - Scoot to Edge of Bed Rolling Right: 5: Supervision;With rail Right Sidelying to Sit: 3: Mod assist Sitting - Scoot to Edge of Bed: 2: Max assist Details for Bed Mobility Assistance: verbal and safety cues Transfers Transfers: Sit to Stand;Stand to Sit Sit to Stand: 1: +2 Total assist;From bed Sit to Stand: Patient Percentage: 40% Stand to Sit: 1: +2 Total assist;To chair/3-in-1;With upper extremity assist Stand to Sit: Patient Percentage: 40% Details for Transfer Assistance: feet blocked to keep pt. from sliding forward as balaance tended posteriorly Ambulation/Gait Ambulation/Gait Assistance: 1: +2 Total assist Ambulation/Gait: Patient Percentage: 40% Ambulation Distance (Feet): 3 Feet Assistive device: Rolling walker Ambulation/Gait Assistance Details: Pt. with posterior lean tendance, needed heavy manual facilitation to correct Gait Pattern: Step-to pattern Stairs: No Wheelchair Mobility Wheelchair Mobility: No    Exercises     PT Diagnosis:    PT Problem List:   PT Treatment Interventions:     PT  Goals Acute Rehab PT Goals PT Goal: Supine/Side to Sit - Progress: Progressing toward goal PT Goal: Sit to Stand - Progress: Progressing toward goal PT Goal: Stand to Sit - Progress: Progressing toward goal PT Transfer Goal: Bed to Chair/Chair to Bed - Progress: Progressing toward goal  Visit Information  Last PT Received On: 09/05/11 Assistance Needed: +2    Subjective Data  Subjective: which hospital am I in?   Cognition  Overall Cognitive Status: History of cognitive impairments - at baseline Arousal/Alertness: Awake/alert Orientation Level: Disoriented to;Place;Time;Situation Behavior During Session: WFL for tasks performed    Balance     End of Session PT - End of Session Equipment Utilized During Treatment: Gait belt;Oxygen Activity Tolerance: Patient limited by fatigue Patient left: in chair;with call bell/phone within reach;with chair alarm set Nurse Communication: Mobility status    Ferman Hamming 09/05/2011, 11:41 AM Weldon Picking PT Acute Rehab Services [phone] [facility] [phone] Weldon Picking PT Acute Rehab Services [phone] [facility] [phone]

## 2011-09-05 NOTE — Progress Notes (Signed)
Subjective: Patient was sleepy this morning when I evaluated the patient, but was easily arousable.  No other specific concerns.  Objective: Vital signs in last 24 hours: Filed Vitals:   09/04/11 1956 09/04/11 2126 09/05/11 0444 09/05/11 0818  BP: 174/56  135/86   Pulse: 70  66   Temp: 98.1 F (36.7 C)  98.6 F (37 C)   TempSrc: Oral  Oral   Resp: 22  20   Height:      Weight: 79.1 kg (174 lb 6.1 oz)     SpO2: 93% 95% 98% 96%   Weight change: -0.7 kg (-1 lb 8.7 oz)  Intake/Output Summary (Last 24 hours) at 09/05/11 0838 Last data filed at 09/05/11 0509  Gross per 24 hour  Intake    223 ml  Output    725 ml  Net   -502 ml    Physical Exam: General: Awake, No acute distress. HEENT: EOMI. Neck: Supple CV: S1 and S2 Lungs: Crackles at bases bilaterally Abdomen: Soft, Nontender, slightly distended, +bowel sounds. Ext: Good pulses. Trace edema.  Lab Results: Basic Metabolic Panel:  Lab 09/05/11 9604 09/04/11 0520 09/03/11 0500 09/02/11 0500 09/01/11 0530  NA 128* 126* 129* 127* 128*  K 3.8 3.4* 3.8 3.5 3.6  CL 84* 83* 85* 84* 85*  CO2 35* 34* 35* 35* 37*  GLUCOSE 203* 207* 287* 236* 177*  BUN 14 11 10 9 8   CREATININE 0.83 0.77 0.73 0.74 0.74  CALCIUM 9.1 8.9 9.0 8.8 8.7  MG -- -- -- -- --  PHOS 4.8* -- -- -- --   Liver Function Tests:  Lab 09/05/11 0635 08/30/11 1719  AST -- 14  ALT -- 19  ALKPHOS -- 111  BILITOT -- 0.4  PROT -- 6.8  ALBUMIN 3.1* 3.5   No results found for this basename: LIPASE:5,AMYLASE:5 in the last 168 hours  Lab 08/30/11 2124  AMMONIA 30   CBC:  Lab 09/01/11 0530 08/31/11 0610 08/30/11 1719  WBC 6.9 7.8 9.9  NEUTROABS -- -- 7.2  HGB 10.5* 10.2* 11.4*  HCT 30.6* 31.1* 34.4*  MCV 90.5 88.9 88.9  PLT 237 239 237   Cardiac Enzymes:  Lab 08/31/11 1259 08/31/11 0610 08/30/11 2124 08/30/11 1720  CKTOTAL 23 22 35 --  CKMB 2.8 3.3 3.9 --  CKMBINDEX -- -- -- --  TROPONINI <0.30 <0.30 <0.30 <0.30   BNP (last 3  results)  Basename 08/30/11 1720 08/14/11 1756 06/21/11 2015  PROBNP 3591.0* 1067.0* 440.3   CBG:  Lab 09/05/11 0755 09/04/11 2114 09/04/11 2005 09/04/11 1621 09/04/11 1123  GLUCAP 213* 200* 219* 165* 331*   No results found for this basename: HGBA1C:5 in the last 72 hours Other Labs: No components found with this basename: POCBNP:3 No results found for this basename: DDIMER:2 in the last 168 hours No results found for this basename: CHOL:2,HDL:2,LDLCALC:2,TRIG:2,CHOLHDL:2,LDLDIRECT:2 in the last 168 hours No results found for this basename: TSH,T4TOTAL,FREET3,T3FREE,FREET4,THYROIDAB in the last 168 hours No results found for this basename: VITAMINB12:2,FOLATE:2,FERRITIN:2,TIBC:2,IRON:2,RETICCTPCT:2 in the last 168 hours  Micro Results: Recent Results (from the past 240 hour(s))  MRSA PCR SCREENING     Status: Normal   Collection Time   08/30/11 11:34 PM      Component Value Range Status Comment   MRSA by PCR NEGATIVE  NEGATIVE Final   URINE CULTURE     Status: Normal   Collection Time   09/04/11  3:16 AM      Component Value Range Status Comment  Specimen Description URINE, CATHETERIZED   Final    Special Requests NONE   Final    Culture  Setup Time [id number]   Final    Colony Count NO GROWTH   Final    Culture NO GROWTH   Final    Report Status 09/05/2011 FINAL   Final     Studies/Results: Dg Chest 2 View  09/04/2011  *RADIOLOGY REPORT*  Clinical Data: Recurrent hyponatremia.  Rule out aspiration pneumonia versus malignancy.  CHEST - 2 VIEW  Comparison: 08/30/2011  Findings: Lateral view degraded by patient arm position.  Mild hyperinflation on the lateral view.  Low volumes on the frontal. Cardiomegaly accentuated by AP portable technique.  Small bilateral pleural effusions.  The right effusion is likely similar. The left effusion is new or slightly increased. No pneumothorax. Mild bibasilar airspace disease is slightly increased.  IMPRESSION: Cardiomegaly with small  bilateral pleural effusions and bibasilar atelectasis.  No evidence of pneumonia or primary malignancy within the chest.  Original Report Authenticated By: Consuello Bossier, M.D.    Medications: I have reviewed the patient's current medications. Scheduled Meds:    . antiseptic oral rinse  15 mL Mouth Rinse BID  . aspirin  81 mg Oral Daily  . atorvastatin  10 mg Oral q1800  . cefTRIAXone (ROCEPHIN)  IV  1 g Intravenous Q24H  . diltiazem  180 mg Oral Daily  . enoxaparin (LOVENOX) injection  40 mg Subcutaneous Q24H  . feeding supplement  237 mL Oral TID BM  . furosemide  60 mg Oral BID  . hydrALAZINE  75 mg Oral Q6H  . insulin aspart  0-15 Units Subcutaneous TID WC  . insulin glargine  50 Units Subcutaneous Daily  . ipratropium  0.5 mg Nebulization TID  . levalbuterol  0.63 mg Nebulization TID  . levothyroxine  100 mcg Oral QAC breakfast  . metoprolol  50 mg Oral BID  . polyethylene glycol  17 g Oral Daily  . potassium chloride  20 mEq Oral Once  . senna-docusate  1 tablet Oral BID  . sodium chloride  3 mL Intravenous Q12H  . sodium chloride  3 mL Intravenous Q12H  . DISCONTD: demeclocycline  300 mg Oral Q6H  . DISCONTD: hydrALAZINE  50 mg Oral Q6H  . DISCONTD: insulin glargine  40 Units Subcutaneous Daily  . DISCONTD: losartan  100 mg Oral Daily   Continuous Infusions:  PRN Meds:.sodium chloride, bisacodyl, hydrALAZINE, ondansetron, sodium chloride  Assessment/Plan: DM 2 uncontrolled Blood sugars now high.  Continue lantus 50 units SQ qhs. Continue sliding scale insulin.  Hyponatremia  Appreciate renal evaluation. Demeclocycline which was initially started admission was discontinued by renal on 09/04/2011. Hyponatremia, likely due diastolic CHF.  Continue fluid restriction 1.5 L. All psych medications and Losartan discontinued. Discussed with Dr. Allena Katz, start the patient on tolvaptan daily, check BMET q12hrs while on tolvapatan, discontinue fluid restriction.  COPD with CO2  retention (chronic obstructive pulmonary disease)  Likely precipitated by xanax, which has been discontinued.  Continue neb treatments. Improved  Metabolic encephalopathy Multifactorial,Likely due to hyponatremia and CO2 retention.  Patient seems at baseline.  Head CT obtained on admission on 08/30/2011 was negative for acute intracranial events.  Diastolic CHF, chronic Continue furosemide 60 mg twice daily (home dose 40 mg twice daily), continue beta blocker. Troponin negative x4. Admission weight 85.6 kg. Continue diuresis.  Hypertension  Continue metoprolol, metoprolol, ditiazem, hydralazine at current doses.  History of paroxysmal atrial fibrillation during her last admission  Not  a candidate for anticoagulation, and family does not want her to be on anticoagulation continue metoprolol diltiazem   CAD (coronary artery disease) History of CVA (cerebrovascular accident), continue aspirin   Dysphagia  Per speech therapy evaluation, Dysphagia 3 (Mechanical Soft);Thin liquid and fluid restriction.  UTI Urinalysis checked on 09/04/2011 showed moderate leukocytes with 21-50 WBCs and many bacteria.  Urine culture shows no growth on 09/04/2011.  Discontinue ceftriaxone after tomorrow's dose to complete empiric 3 day course given negative urine culture.  History of ESBL.  Urinary retention Intermittent I&O as needed.  Constipation Continue bowel regimen.  Had bowel movement during the night.  Hypokalemia Replace as needed.  Disposition Pending.   LOS: 6 days  Griselle Rufer A, MD 09/05/2011, 8:38 AM

## 2011-09-05 NOTE — Progress Notes (Signed)
Patient ID: [name], female   DOB: 1919-01-13, 78 y.o.   MRN: [id number]   [facility] Progress Note    Subjective:   Sleepy this morning after no restful sleep over the past 2 days. No acute events overnight   Objective:   BP 176/53  Pulse 86  Temp 98.3 F (36.8 C) (Axillary)  Resp 20  Ht 5\' 9"  (1.753 m)  Wt 79.1 kg (174 lb 6.1 oz)  BMI 25.75 kg/m2  SpO2 98%  Intake/Output Summary (Last 24 hours) at 09/05/11 1141 Last data filed at 09/05/11 0900  Gross per 24 hour  Intake    110 ml  Output    725 ml  Net   -615 ml   Weight change: -0.7 kg (-1 lb 8.7 oz)  Physical Exam: NWG:NFAOZHYQMVH resting up in a recliner QIO:NGEXB RRR, normal S1 and s2  Resp:Coarse breath sounds bilaterally, no wheeze MWU:XLKG, flat, NT, BS normal MWN:UUVOZ-3+ LE edema  Imaging: Dg Chest 2 View  09/04/2011  *RADIOLOGY REPORT*  Clinical Data: Recurrent hyponatremia.  Rule out aspiration pneumonia versus malignancy.  CHEST - 2 VIEW  Comparison: 08/30/2011  Findings: Lateral view degraded by patient arm position.  Mild hyperinflation on the lateral view.  Low volumes on the frontal. Cardiomegaly accentuated by AP portable technique.  Small bilateral pleural effusions.  The right effusion is likely similar. The left effusion is new or slightly increased. No pneumothorax. Mild bibasilar airspace disease is slightly increased.  IMPRESSION: Cardiomegaly with small bilateral pleural effusions and bibasilar atelectasis.  No evidence of pneumonia or primary malignancy within the chest.  Original Report Authenticated By: Consuello Bossier, M.D.    Labs: BMET  Lab 09/05/11 6644 09/04/11 0520 09/03/11 0500 09/02/11 0500 09/01/11 0530 08/31/11 0610 08/30/11 1719  NA 128* 126* 129* 127* 128* 130* 126*  K 3.8 3.4* 3.8 3.5 3.6 3.7 4.0  CL 84* 83* 85* 84* 85* 85* 84*  CO2 35* 34* 35* 35* 37* 38* 35*  GLUCOSE 203* 207* 287* 236* 177* 70 107*  BUN 14 11 10 9 8 7 8   CREATININE 0.83 0.77 0.73  0.74 0.74 0.66 0.65  ALB -- -- -- -- -- -- --  CALCIUM 9.1 8.9 9.0 8.8 8.7 8.8 9.3  PHOS 4.8* -- -- -- -- -- --   CBC  Lab 09/01/11 0530 08/31/11 0610 08/30/11 1719  WBC 6.9 7.8 9.9  NEUTROABS -- -- 7.2  HGB 10.5* 10.2* 11.4*  HCT 30.6* 31.1* 34.4*  MCV 90.5 88.9 88.9  PLT 237 239 237    Medications:      . antiseptic oral rinse  15 mL Mouth Rinse BID  . aspirin  81 mg Oral Daily  . atorvastatin  10 mg Oral q1800  . cefTRIAXone (ROCEPHIN)  IV  1 g Intravenous Q24H  . diltiazem  180 mg Oral Daily  . enoxaparin (LOVENOX) injection  40 mg Subcutaneous Q24H  . feeding supplement  237 mL Oral TID BM  . furosemide  60 mg Oral BID  . hydrALAZINE  75 mg Oral Q6H  . insulin aspart  0-15 Units Subcutaneous TID WC  . insulin glargine  50 Units Subcutaneous Daily  . ipratropium  0.5 mg Nebulization TID  . levalbuterol  0.63 mg Nebulization TID  . levothyroxine  100 mcg Oral QAC breakfast  . metoprolol  50 mg Oral BID  . polyethylene glycol  17 g Oral Daily  . potassium chloride  20 mEq Oral Once  .  senna-docusate  1 tablet Oral BID  . sodium chloride  3 mL Intravenous Q12H  . sodium chloride  3 mL Intravenous Q12H  . DISCONTD: cefTRIAXone (ROCEPHIN)  IV  1 g Intravenous Q24H  . DISCONTD: demeclocycline  300 mg Oral Q6H  . DISCONTD: hydrALAZINE  50 mg Oral Q6H  . DISCONTD: insulin glargine  40 Units Subcutaneous Daily  . DISCONTD: losartan  100 mg Oral Daily     Assessment/ Plan:   1. Hyponatremia- and workup so far appears to be consistent with a true hyponatremia given serum hypo-osmolality. Urine osmolality is pending at this time: Which will be key in making decisions regarding use of Tolvaptan. Adjusting diuretic therapy/management of congestive heart failure and ARB currently on hold to allow for sodium normalization. Suspect that she has a reset osmostat given advanced age. No acute pulmonary process or suspicions of malignancy noted from chest x-ray. 2. Hyperglycemia/DM 2-  increased insulin per primary service 3. HTN- elevated overnight-on hydralazine, metoprolol and diltiazem with twice a day oral Lasix. And or continue to monitor him with fluid restriction. 4. Urinary retention- bladder scan with >500cc. Will order I&O cath and check bladder scan q8 and I&O if >200 cc PVR 5. UTI- on rocephin. 6. AMS- ?dementia or chronic illness/metabolic encephelopathy 7. Anemia- ? Due to lab draws. Guaiac stools, check SPEP/UPEP per primary svc and iron stores Zetta Bills, MD 09/05/2011, 11:41 AM

## 2011-09-06 DIAGNOSIS — E1169 Type 2 diabetes mellitus with other specified complication: Secondary | ICD-10-CM

## 2011-09-06 DIAGNOSIS — R4182 Altered mental status, unspecified: Secondary | ICD-10-CM

## 2011-09-06 DIAGNOSIS — Z8679 Personal history of other diseases of the circulatory system: Secondary | ICD-10-CM

## 2011-09-06 DIAGNOSIS — E871 Hypo-osmolality and hyponatremia: Secondary | ICD-10-CM

## 2011-09-06 LAB — BASIC METABOLIC PANEL
CO2: 36 mEq/L — ABNORMAL HIGH (ref 19–32)
CO2: 35 mEq/L — ABNORMAL HIGH (ref 19–32)
Chloride: 86 mEq/L — ABNORMAL LOW (ref 96–112)
Chloride: 89 mEq/L — ABNORMAL LOW (ref 96–112)
Potassium: 3.5 mEq/L (ref 3.5–5.1)
Potassium: 3.7 mEq/L (ref 3.5–5.1)
Sodium: 131 mEq/L — ABNORMAL LOW (ref 135–145)
Sodium: 133 mEq/L — ABNORMAL LOW (ref 135–145)

## 2011-09-06 LAB — GLUCOSE, CAPILLARY
Glucose-Capillary: 174 mg/dL — ABNORMAL HIGH (ref 70–99)
Glucose-Capillary: 205 mg/dL — ABNORMAL HIGH (ref 70–99)
Glucose-Capillary: 201 mg/dL — ABNORMAL HIGH (ref 70–99)

## 2011-09-06 MED ORDER — TOLVAPTAN 15 MG PO TABS
15.0000 mg | ORAL_TABLET | Freq: Every morning | ORAL | Status: DC
  Administered 2011-09-06 – 2011-09-07 (×2): 15 mg via ORAL
  Filled 2011-09-06 (×5): qty 1

## 2011-09-06 MED ORDER — FUROSEMIDE 40 MG PO TABS
40.0000 mg | ORAL_TABLET | Freq: Every day | ORAL | Status: DC
  Filled 2011-09-06 (×2): qty 1

## 2011-09-06 MED ORDER — DEMECLOCYCLINE HCL 150 MG PO TABS
300.0000 mg | ORAL_TABLET | Freq: Two times a day (BID) | ORAL | Status: DC
  Administered 2011-09-06 – 2011-09-08 (×4): 300 mg via ORAL
  Filled 2011-09-06 (×7): qty 2

## 2011-09-06 MED ORDER — IPRATROPIUM BROMIDE 0.02 % IN SOLN: 0.5000 mg | Freq: Four times a day (QID) | RESPIRATORY_TRACT | Status: DC | PRN

## 2011-09-06 MED ORDER — LEVALBUTEROL HCL 0.63 MG/3ML IN NEBU
0.6300 mg | INHALATION_SOLUTION | Freq: Four times a day (QID) | RESPIRATORY_TRACT | Status: DC | PRN
  Filled 2011-09-06: qty 3

## 2011-09-06 NOTE — Progress Notes (Signed)
Patient ID: [name], female   DOB: 02-23-1919, 78 y.o.   MRN: [id number]   [facility] Progress Note    Subjective:   No acute events overnight-started on samsca yesterday.    Objective:   BP 158/49  Pulse 69  Temp 97.6 F (36.4 C) (Oral)  Resp 20  Ht 5\' 9"  (1.753 m)  Wt 80 kg (176 lb 5.9 oz)  BMI 26.05 kg/m2  SpO2 97%  Intake/Output Summary (Last 24 hours) at 09/06/11 1035 Last data filed at 09/06/11 0900  Gross per 24 hour  Intake    220 ml  Output      0 ml  Net    220 ml   Weight change: 0.9 kg (1 lb 15.8 oz)  Physical Exam: Gen: Comfortably sleeping in bed CVS: Pulse regular in rate and rhythm, heart sounds S1 and S2 normal Resp: Fine rales left base otherwise clear to auscultation Abd: Soft, flat, nontender and bowel sounds are normal Ext: Trace lower extremity edema  Imaging: Dg Chest 2 View  09/04/2011  *RADIOLOGY REPORT*  Clinical Data: Recurrent hyponatremia.  Rule out aspiration pneumonia versus malignancy.  CHEST - 2 VIEW  Comparison: 08/30/2011  Findings: Lateral view degraded by patient arm position.  Mild hyperinflation on the lateral view.  Low volumes on the frontal. Cardiomegaly accentuated by AP portable technique.  Small bilateral pleural effusions.  The right effusion is likely similar. The left effusion is new or slightly increased. No pneumothorax. Mild bibasilar airspace disease is slightly increased.  IMPRESSION: Cardiomegaly with small bilateral pleural effusions and bibasilar atelectasis.  No evidence of pneumonia or primary malignancy within the chest.  Original Report Authenticated By: Consuello Bossier, M.D.    Labs: BMET  Lab 09/06/11 0550 09/05/11 1732 09/05/11 4098 09/04/11 0520 09/03/11 0500 09/02/11 0500 09/01/11 0530  NA 131* 129* 128* 126* 129* 127* 128*  K 3.5 3.5 3.8 3.4* 3.8 3.5 3.6  CL 86* 85* 84* 83* 85* 84* 85*  CO2 36* 34* 35* 34* 35* 35* 37*  GLUCOSE 146* 82 203* 207* 287* 236* 177*  BUN 19 18 14 11 10 9  8   CREATININE 1.00 0.90 0.83 0.77 0.73 0.74 0.74  ALB -- -- -- -- -- -- --  CALCIUM 8.9 9.1 9.1 8.9 9.0 8.8 8.7  PHOS -- -- 4.8* -- -- -- --   CBC  Lab 09/01/11 0530 08/31/11 0610 08/30/11 1719  WBC 6.9 7.8 9.9  NEUTROABS -- -- 7.2  HGB 10.5* 10.2* 11.4*  HCT 30.6* 31.1* 34.4*  MCV 90.5 88.9 88.9  PLT 237 239 237    Medications:      . antiseptic oral rinse  15 mL Mouth Rinse BID  . aspirin  81 mg Oral Daily  . atorvastatin  10 mg Oral q1800  . cefTRIAXone (ROCEPHIN)  IV  1 g Intravenous Q24H  . diltiazem  180 mg Oral Daily  . enoxaparin (LOVENOX) injection  40 mg Subcutaneous Q24H  . feeding supplement  237 mL Oral TID BM  . furosemide  40 mg Oral q1800  . hydrALAZINE  75 mg Oral Q6H  . insulin aspart  0-15 Units Subcutaneous TID WC  . insulin glargine  50 Units Subcutaneous Daily  . levothyroxine  100 mcg Oral QAC breakfast  . metoprolol  50 mg Oral BID  . polyethylene glycol  17 g Oral Daily  . senna-docusate  1 tablet Oral BID  . sodium chloride  3 mL Intravenous Q12H  .  sodium chloride  3 mL Intravenous Q12H  . tolvaptan  15 mg Oral q morning - 10a  . DISCONTD: furosemide  60 mg Oral BID  . DISCONTD: ipratropium  0.5 mg Nebulization TID  . DISCONTD: levalbuterol  0.63 mg Nebulization TID  . DISCONTD: levalbuterol  0.63 mg Nebulization TID  . DISCONTD: tolvaptan  15 mg Oral Q24H     Assessment/ Plan:   1. Hyponatremia- appears to be consistent with a reset osmostat in an elderly patient with activated ADH (inappropriate-likely tied to diastolic dysfunction). Responded well overnight with addition of samsca for management of hyponatremia and sodium rose from 128-131. I discussed the difficulty of management with her daughter and explained that samsca cannot be used for more than 30 days at a time due to hepatotoxicity. In order to try and minimize rehospitalizations, will go ahead and start her on demeclocycline to attenuate renal response to ADH. She can hopefully  be discharged from the hospital within 48-72 hours on 2 weeks of samsca- theoretically, this should be adequate duration for demeclocycline to start working. Upon discharge, would send her out on furosemide 40 mg once a day. She will need weekly labs to be done at her primary care provider's office.  2. Hyperglycemia/DM 2- increased insulin per primary service 3. HTN- elevated overnight-on hydralazine, metoprolol and diltiazem with twice a day oral Lasix. And or continue to monitor her with fluid restriction. 4. UTI- on rocephin. 5. AMS- ?dementia or chronic illness/metabolic encephelopathy  Zetta Bills, MD 09/06/2011, 10:35 AM

## 2011-09-06 NOTE — Progress Notes (Signed)
Subjective: Patient had no difficulty recognizing who I was and was able to state my name.  Was sleeping this morning, easily arousable.  Thought this was April and did not know the year.  She did note that she was at [facility].  She stated that she was 78 years old.  Denies any pain or any other specific concerns.    Objective: Vital signs in last 24 hours: Filed Vitals:   09/05/11 1207 09/05/11 1644 09/05/11 2054 09/06/11 0404  BP: 161/55 138/40 140/41 141/60  Pulse: 78 65 61 65  Temp: 98.3 F (36.8 C) 97.8 F (36.6 C) 97.8 F (36.6 C) 97.7 F (36.5 C)  TempSrc: Oral Axillary Oral Oral  Resp: 20 20 20 20   Height:      Weight:   80 kg (176 lb 5.9 oz)   SpO2: 98% 94% 98% 98%   Weight change: 0.9 kg (1 lb 15.8 oz)  Intake/Output Summary (Last 24 hours) at 09/06/11 0819 Last data filed at 09/05/11 1810  Gross per 24 hour  Intake    170 ml  Output      0 ml  Net    170 ml    Physical Exam: General: Awake, oriented to location and self, No acute distress. HEENT: EOMI. Neck: Supple CV: S1 and S2 Lungs: Crackles at bases bilaterally Abdomen: Soft, Nontender, slightly distended, +bowel sounds. Ext: Good pulses. Trace edema.  Lab Results: Basic Metabolic Panel:  Lab 09/06/11 8119 09/05/11 1732 09/05/11 0635 09/04/11 0520 09/03/11 0500  NA 131* 129* 128* 126* 129*  K 3.5 3.5 3.8 3.4* 3.8  CL 86* 85* 84* 83* 85*  CO2 36* 34* 35* 34* 35*  GLUCOSE 146* 82 203* 207* 287*  BUN 19 18 14 11 10   CREATININE 1.00 0.90 0.83 0.77 0.73  CALCIUM 8.9 9.1 9.1 8.9 9.0  MG -- -- -- -- --  PHOS -- -- 4.8* -- --   Liver Function Tests:  Lab 09/05/11 0635 08/30/11 1719  AST -- 14  ALT -- 19  ALKPHOS -- 111  BILITOT -- 0.4  PROT -- 6.8  ALBUMIN 3.1* 3.5   No results found for this basename: LIPASE:5,AMYLASE:5 in the last 168 hours  Lab 08/30/11 2124  AMMONIA 30   CBC:  Lab 09/01/11 0530 08/31/11 0610 08/30/11 1719  WBC 6.9 7.8 9.9  NEUTROABS -- -- 7.2  HGB 10.5* 10.2*  11.4*  HCT 30.6* 31.1* 34.4*  MCV 90.5 88.9 88.9  PLT 237 239 237   Cardiac Enzymes:  Lab 08/31/11 1259 08/31/11 0610 08/30/11 2124 08/30/11 1720  CKTOTAL 23 22 35 --  CKMB 2.8 3.3 3.9 --  CKMBINDEX -- -- -- --  TROPONINI <0.30 <0.30 <0.30 <0.30   BNP (last 3 results)  Basename 08/30/11 1720 08/14/11 1756 06/21/11 2015  PROBNP 3591.0* 1067.0* 440.3   CBG:  Lab 09/06/11 0747 09/05/11 2100 09/05/11 1642 09/05/11 1206 09/05/11 0755  GLUCAP 174* 201* 99 273* 213*   No results found for this basename: HGBA1C:5 in the last 72 hours Other Labs: No components found with this basename: POCBNP:3 No results found for this basename: DDIMER:2 in the last 168 hours No results found for this basename: CHOL:2,HDL:2,LDLCALC:2,TRIG:2,CHOLHDL:2,LDLDIRECT:2 in the last 168 hours  Lab 09/05/11 0635  TSH 1.618  T4TOTAL --  T3FREE --  FREET4 --  THYROIDAB --   No results found for this basename: VITAMINB12:2,FOLATE:2,FERRITIN:2,TIBC:2,IRON:2,RETICCTPCT:2 in the last 168 hours  Micro Results: Recent Results (from the past 240 hour(s))  MRSA PCR  SCREENING     Status: Normal   Collection Time   08/30/11 11:34 PM      Component Value Range Status Comment   MRSA by PCR NEGATIVE  NEGATIVE Final   URINE CULTURE     Status: Normal   Collection Time   09/04/11  3:16 AM      Component Value Range Status Comment   Specimen Description URINE, CATHETERIZED   Final    Special Requests NONE   Final    Culture  Setup Time [id number]   Final    Colony Count NO GROWTH   Final    Culture NO GROWTH   Final    Report Status 09/05/2011 FINAL   Final     Studies/Results: Dg Chest 2 View  09/04/2011  *RADIOLOGY REPORT*  Clinical Data: Recurrent hyponatremia.  Rule out aspiration pneumonia versus malignancy.  CHEST - 2 VIEW  Comparison: 08/30/2011  Findings: Lateral view degraded by patient arm position.  Mild hyperinflation on the lateral view.  Low volumes on the frontal. Cardiomegaly accentuated by AP  portable technique.  Small bilateral pleural effusions.  The right effusion is likely similar. The left effusion is new or slightly increased. No pneumothorax. Mild bibasilar airspace disease is slightly increased.  IMPRESSION: Cardiomegaly with small bilateral pleural effusions and bibasilar atelectasis.  No evidence of pneumonia or primary malignancy within the chest.  Original Report Authenticated By: Consuello Bossier, M.D.    Medications: I have reviewed the patient's current medications. Scheduled Meds:    . antiseptic oral rinse  15 mL Mouth Rinse BID  . aspirin  81 mg Oral Daily  . atorvastatin  10 mg Oral q1800  . cefTRIAXone (ROCEPHIN)  IV  1 g Intravenous Q24H  . diltiazem  180 mg Oral Daily  . enoxaparin (LOVENOX) injection  40 mg Subcutaneous Q24H  . feeding supplement  237 mL Oral TID BM  . furosemide  60 mg Oral BID  . hydrALAZINE  75 mg Oral Q6H  . insulin aspart  0-15 Units Subcutaneous TID WC  . insulin glargine  50 Units Subcutaneous Daily  . levothyroxine  100 mcg Oral QAC breakfast  . metoprolol  50 mg Oral BID  . polyethylene glycol  17 g Oral Daily  . senna-docusate  1 tablet Oral BID  . sodium chloride  3 mL Intravenous Q12H  . sodium chloride  3 mL Intravenous Q12H  . tolvaptan  15 mg Oral Q24H  . DISCONTD: cefTRIAXone (ROCEPHIN)  IV  1 g Intravenous Q24H  . DISCONTD: ipratropium  0.5 mg Nebulization TID  . DISCONTD: levalbuterol  0.63 mg Nebulization TID  . DISCONTD: levalbuterol  0.63 mg Nebulization TID   Continuous Infusions:  PRN Meds:.sodium chloride, bisacodyl, hydrALAZINE, ipratropium, levalbuterol, ondansetron, sodium chloride  Assessment/Plan: Hyponatremia  Appreciate renal input. Demeclocycline which was initially started admission was discontinued by renal on 09/04/2011. Discussed with Dr. Allena Katz on 09/05/2011, started the patient on tolvaptan daily, check BMET q12hrs while on tolvapatan, discontinue fluid restriction. Hyponatremia, likely due  diastolic CHF.  All psych medications and Losartan discontinued.   DM 2 uncontrolled Continue lantus 50 units SQ qhs. Continue sliding scale insulin.  COPD with CO2 retention (chronic obstructive pulmonary disease)  Likely precipitated by xanax, which has been discontinued.  Continue neb treatments. Improved  Metabolic encephalopathy Multifactorial,Likely due to hyponatremia and CO2 retention.  Patient seems at baseline.  Head CT obtained on admission on 08/30/2011 was negative for acute intracranial events.  Diastolic CHF, chronic  Continue furosemide 60 mg twice daily (home dose 40 mg twice daily), continue beta blocker. Troponin negative x4. Admission weight 85.6 kg. Continue diuresis.  Hypertension  Continue metoprolol, metoprolol, ditiazem, hydralazine at current doses.  Stable.  History of paroxysmal atrial fibrillation during her last admission  Not a candidate for anticoagulation, and family does not want her to be on anticoagulation continue metoprolol diltiazem   CAD (coronary artery disease) History of CVA (cerebrovascular accident), continue aspirin   Dysphagia  Per speech therapy evaluation, Dysphagia 3 (Mechanical Soft);Thin liquid and fluid restriction.  UTI Urinalysis checked on 09/04/2011 showed moderate leukocytes with 21-50 WBCs and many bacteria.  Urine culture shows no growth on 09/04/2011.  Discontinue ceftriaxone after today's dose to complete empiric 3 day course given negative urine culture.  History of ESBL.  Urinary retention Intermittent I&O as needed.  Constipation Continue bowel regimen, patient has had a good bowel movement.  Hypokalemia Replace as needed.  Disposition Pending, improvement in sodium.  Plan for discharge on Monday, 09/08/2011.   LOS: 7 days  Rayburn Mundis A, MD 09/06/2011, 8:19 AM

## 2011-09-06 NOTE — Progress Notes (Signed)
Post void residual checked, residual = 151cc

## 2011-09-07 DIAGNOSIS — E871 Hypo-osmolality and hyponatremia: Secondary | ICD-10-CM

## 2011-09-07 DIAGNOSIS — Z8679 Personal history of other diseases of the circulatory system: Secondary | ICD-10-CM

## 2011-09-07 DIAGNOSIS — E1169 Type 2 diabetes mellitus with other specified complication: Secondary | ICD-10-CM

## 2011-09-07 DIAGNOSIS — R4182 Altered mental status, unspecified: Secondary | ICD-10-CM

## 2011-09-07 LAB — BASIC METABOLIC PANEL
CO2: 36 mEq/L — ABNORMAL HIGH (ref 19–32)
Chloride: 91 mEq/L — ABNORMAL LOW (ref 96–112)
Chloride: 88 mEq/L — ABNORMAL LOW (ref 96–112)
Creatinine, Ser: 0.93 mg/dL (ref 0.50–1.10)
GFR calc Af Amer: 56 mL/min — ABNORMAL LOW (ref >90)
GFR calc Af Amer: 61 mL/min — ABNORMAL LOW (ref >90)
GFR calc non Af Amer: 53 mL/min — ABNORMAL LOW (ref >90)
Potassium: 3.8 mEq/L (ref 3.5–5.1)
Sodium: 136 mEq/L (ref 135–145)

## 2011-09-07 LAB — GLUCOSE, CAPILLARY: Glucose-Capillary: 125 mg/dL — ABNORMAL HIGH (ref 70–99)

## 2011-09-07 MED ORDER — HYDRALAZINE HCL 50 MG PO TABS: 100.0000 mg | ORAL_TABLET | Freq: Four times a day (QID) | ORAL | Status: DC

## 2011-09-07 MED ORDER — HYDRALAZINE HCL 50 MG PO TABS
100.0000 mg | ORAL_TABLET | Freq: Three times a day (TID) | ORAL | Status: DC
  Administered 2011-09-07 – 2011-09-08 (×4): 100 mg via ORAL
  Filled 2011-09-07 (×6): qty 2

## 2011-09-07 NOTE — Progress Notes (Signed)
Patient ID: [name], female   DOB: [birth year], 78 y.o.   MRN: [id number]   [facility] Progress Note    Subjective:   No acute events overnight-started on samsca yesterday.    Objective:   BP 164/50  Pulse 69  Temp 98 F (36.7 C) (Oral)  Resp 20  Ht 5\' 9"  (1.753 m)  Wt 79.8 kg (175 lb 14.8 oz)  BMI 25.98 kg/m2  SpO2 99%  Intake/Output Summary (Last 24 hours) at 09/07/11 1504 Last data filed at 09/07/11 1300  Gross per 24 hour  Intake    620 ml  Output      0 ml  Net    620 ml   Weight change: -0.2 kg (-7.1 oz)  Physical Exam: Gen: Comfortably sleeping in bed CVS: Pulse regular in rate and rhythm, heart sounds S1 and S2 normal Resp: Fine rales left base otherwise clear to auscultation Abd: Soft, flat, nontender and bowel sounds are normal Ext: Trace lower extremity edema  Imaging: No results found.  Labs: BMET  Lab 09/07/11 0500 09/06/11 1705 09/06/11 0550 09/05/11 1732 09/05/11 0635 09/04/11 0520 09/03/11 0500  NA 136 133* 131* 129* 128* 126* 129*  K 3.7 3.7 3.5 3.5 3.8 3.4* 3.8  CL 91* 89* 86* 85* 84* 83* 85*  CO2 36* 35* 36* 34* 35* 34* 35*  GLUCOSE 103* 189* 146* 82 203* 207* 287*  BUN 23 21 19 18 14 11 10   CREATININE 1.00 0.99 1.00 0.90 0.83 0.77 0.73  ALB -- -- -- -- -- -- --  CALCIUM 9.0 9.0 8.9 9.1 9.1 8.9 9.0  PHOS -- -- -- -- 4.8* -- --   CBC  Lab 09/01/11 0530  WBC 6.9  NEUTROABS --  HGB 10.5*  HCT 30.6*  MCV 90.5  PLT 237    Medications:       . antiseptic oral rinse  15 mL Mouth Rinse BID  . aspirin  81 mg Oral Daily  . atorvastatin  10 mg Oral q1800  . demeclocycline  300 mg Oral Q12H  . diltiazem  180 mg Oral Daily  . enoxaparin (LOVENOX) injection  40 mg Subcutaneous Q24H  . feeding supplement  237 mL Oral TID BM  . furosemide  40 mg Oral q1800  . hydrALAZINE  100 mg Oral Q8H  . insulin aspart  0-15 Units Subcutaneous TID WC  . insulin glargine  50 Units Subcutaneous Daily  . levothyroxine  100 mcg  Oral QAC breakfast  . metoprolol  50 mg Oral BID  . polyethylene glycol  17 g Oral Daily  . senna-docusate  1 tablet Oral BID  . sodium chloride  3 mL Intravenous Q12H  . sodium chloride  3 mL Intravenous Q12H  . tolvaptan  15 mg Oral q morning - 10a  . DISCONTD: hydrALAZINE  100 mg Oral Q6H  . DISCONTD: hydrALAZINE  75 mg Oral Q6H     Assessment/ Plan:   1. Hyponatremia- appears to be consistent with a reset osmostat in an elderly patient with activated ADH (inappropriate-likely tied to diastolic dysfunction). Responded to tolvaptan, Na up to 136 today.  Will d/c tolvaptan (stop when >135), continue demeclocycline.  UOsm was mid 300's.  Lowest Na+ was 126 this admit, 120 last admit.  Low Na+ not causing AMS at these levels most likely. Recommend outpt f/u with PCP for Na+ levels.  Will sign off, please call as needed. She can be d/c'd on usual dose of lasix 60  bid also.   2. Hyperglycemia/DM 2- increased insulin per primary service 3. HTN- elevated overnight-on hydralazine, metoprolol and diltiazem with twice a day oral Lasix. And or continue to monitor her with fluid restriction. 4. UTI- on rocephin. 5. AMS- ?dementia or chronic illness/metabolic encephelopathy  Vinson Moselle  MD [facility] [phone] pgr    [phone] cell 09/07/2011, 3:11 PM

## 2011-09-07 NOTE — Progress Notes (Signed)
Subjective: Sleeping this AM. No specific concerns.    Objective: Vital signs in last 24 hours: Filed Vitals:   09/06/11 2349 09/07/11 0428 09/07/11 0935 09/07/11 1102  BP: 143/45 140/44 178/68 161/59  Pulse: 64 61 75   Temp: 97.5 F (36.4 C) 97.2 F (36.2 C) 97.4 F (36.3 C)   TempSrc: Oral Oral Oral   Resp: 20 20 20    Height:      Weight:      SpO2: 96% 96% 100%    Weight change: -0.2 kg (-7.1 oz)  Intake/Output Summary (Last 24 hours) at 09/07/11 1153 Last data filed at 09/07/11 0900  Gross per 24 hour  Intake    430 ml  Output      0 ml  Net    430 ml    Physical Exam: General: Awake, No acute distress. HEENT: EOMI. Neck: Supple CV: S1 and S2 Lungs: Crackles at bases bilaterally Abdomen: Soft, Nontender, slightly distended, +bowel sounds. Ext: Good pulses. Trace edema.  Lab Results: Basic Metabolic Panel:  Lab 09/07/11 1308 09/06/11 1705 09/06/11 0550 09/05/11 1732 09/05/11 0635  NA 136 133* 131* 129* 128*  K 3.7 3.7 3.5 3.5 3.8  CL 91* 89* 86* 85* 84*  CO2 36* 35* 36* 34* 35*  GLUCOSE 103* 189* 146* 82 203*  BUN 23 21 19 18 14   CREATININE 1.00 0.99 1.00 0.90 0.83  CALCIUM 9.0 9.0 8.9 9.1 9.1  MG -- -- -- -- --  PHOS -- -- -- -- 4.8*   Liver Function Tests:  Lab 09/05/11 0635  AST --  ALT --  ALKPHOS --  BILITOT --  PROT --  ALBUMIN 3.1*   No results found for this basename: LIPASE:5,AMYLASE:5 in the last 168 hours No results found for this basename: AMMONIA:5 in the last 168 hours CBC:  Lab 09/01/11 0530  WBC 6.9  NEUTROABS --  HGB 10.5*  HCT 30.6*  MCV 90.5  PLT 237   Cardiac Enzymes:  Lab 08/31/11 1259  CKTOTAL 23  CKMB 2.8  CKMBINDEX --  TROPONINI <0.30   BNP (last 3 results)  Basename 08/30/11 1720 08/14/11 1756 06/21/11 2015  PROBNP 3591.0* 1067.0* 440.3   CBG:  Lab 09/07/11 0741 09/06/11 1706 09/06/11 1138 09/06/11 0747 09/05/11 2100  GLUCAP 125* 201* 205* 174* 201*   No results found for this basename: HGBA1C:5  in the last 72 hours Other Labs: No components found with this basename: POCBNP:3 No results found for this basename: DDIMER:2 in the last 168 hours No results found for this basename: CHOL:2,HDL:2,LDLCALC:2,TRIG:2,CHOLHDL:2,LDLDIRECT:2 in the last 168 hours  Lab 09/05/11 0635  TSH 1.618  T4TOTAL --  T3FREE --  FREET4 --  THYROIDAB --   No results found for this basename: VITAMINB12:2,FOLATE:2,FERRITIN:2,TIBC:2,IRON:2,RETICCTPCT:2 in the last 168 hours  Micro Results: Recent Results (from the past 240 hour(s))  MRSA PCR SCREENING     Status: Normal   Collection Time   08/30/11 11:34 PM      Component Value Range Status Comment   MRSA by PCR NEGATIVE  NEGATIVE Final   URINE CULTURE     Status: Normal   Collection Time   09/04/11  3:16 AM      Component Value Range Status Comment   Specimen Description URINE, CATHETERIZED   Final    Special Requests NONE   Final    Culture  Setup Time [id number]   Final    Colony Count NO GROWTH   Final    Culture NO  GROWTH   Final    Report Status 09/05/2011 FINAL   Final     Studies/Results: No results found.  Medications: I have reviewed the patient's current medications. Scheduled Meds:    . antiseptic oral rinse  15 mL Mouth Rinse BID  . aspirin  81 mg Oral Daily  . atorvastatin  10 mg Oral q1800  . demeclocycline  300 mg Oral Q12H  . diltiazem  180 mg Oral Daily  . enoxaparin (LOVENOX) injection  40 mg Subcutaneous Q24H  . feeding supplement  237 mL Oral TID BM  . furosemide  40 mg Oral q1800  . hydrALAZINE  75 mg Oral Q6H  . insulin aspart  0-15 Units Subcutaneous TID WC  . insulin glargine  50 Units Subcutaneous Daily  . levothyroxine  100 mcg Oral QAC breakfast  . metoprolol  50 mg Oral BID  . polyethylene glycol  17 g Oral Daily  . senna-docusate  1 tablet Oral BID  . sodium chloride  3 mL Intravenous Q12H  . sodium chloride  3 mL Intravenous Q12H  . tolvaptan  15 mg Oral q morning - 10a   Continuous Infusions:  PRN  Meds:.sodium chloride, bisacodyl, hydrALAZINE, ipratropium, levalbuterol, ondansetron, sodium chloride  Assessment/Plan: Hyponatremia  Appreciate renal input. Demeclocycline was restarted by renal on 09/06/2011. Continue tolvaptan for 2 weeks per renal. Continue BMET q12hrs while on tolvapatan. Hyponatremia resolved, likely due diastolic CHF.  All psych medications and Losartan discontinued.   DM 2 uncontrolled Better controlled. Continue lantus 50 units SQ qhs. Continue sliding scale insulin.  COPD with CO2 retention (chronic obstructive pulmonary disease)  Likely precipitated by xanax, which has been discontinued.  Continue neb treatments. Improved  Metabolic encephalopathy Multifactorial, likely due to hyponatremia and CO2 retention.  Patient seems at baseline.  Head CT obtained on admission on 08/30/2011 was negative for acute intracranial events.  Diastolic CHF, chronic Continue furosemide 40 mg daily, (home dose 40 mg twice daily), continue beta blocker. Troponin negative x4. Admission weight 85.6 kg. Continue diuresis.  Hypertension  Continue metoprolol, hydralazine, and diltiazem.  History of paroxysmal atrial fibrillation during her last admission  Not a candidate for anticoagulation, and family does not want her to be on anticoagulation continue metoprolol and diltiazem. Stable.  CAD (coronary artery disease) History of CVA (cerebrovascular accident), continue aspirin   Dysphagia  Per speech therapy evaluation, Dysphagia 3 (Mechanical Soft);Thin liquid and fluid restriction.  UTI Urinalysis checked on 09/04/2011 showed moderate leukocytes with 21-50 WBCs and many bacteria.  Urine culture shows no growth on 09/04/2011.  Discontinued ceftriaxone after 3 day empiric course given negative urine culture.  History of ESBL.  Urinary retention Intermittent I&O as needed.  Constipation Continue bowel regimen, patient has had a good bowel movement.  Hypokalemia Replace as  needed.  Disposition Pending, improvement in sodium.  Plan for discharge on Monday, 09/08/2011. Attempted to contact patient's daughter by telephone but was unavailable did not leave a message.   LOS: 8 days  [name] A, MD 09/07/2011, 11:53 AM

## 2011-09-07 NOTE — Plan of Care (Signed)
Problem: Consults Goal: Nutrition Consult-if indicated Outcome: Completed/Met Date Met:  09/07/11 Patient is on Diet Dys 3 with thin liquids

## 2011-09-08 DIAGNOSIS — E871 Hypo-osmolality and hyponatremia: Secondary | ICD-10-CM

## 2011-09-08 DIAGNOSIS — E1169 Type 2 diabetes mellitus with other specified complication: Secondary | ICD-10-CM

## 2011-09-08 DIAGNOSIS — R4182 Altered mental status, unspecified: Secondary | ICD-10-CM

## 2011-09-08 DIAGNOSIS — Z8679 Personal history of other diseases of the circulatory system: Secondary | ICD-10-CM

## 2011-09-08 LAB — BASIC METABOLIC PANEL
BUN: 23 mg/dL (ref 6–23)
Calcium: 9.2 mg/dL (ref 8.4–10.5)
GFR calc Af Amer: 56 mL/min — ABNORMAL LOW (ref >90)
GFR calc non Af Amer: 48 mL/min — ABNORMAL LOW (ref >90)
Potassium: 3.8 mEq/L (ref 3.5–5.1)
Sodium: 136 mEq/L (ref 135–145)

## 2011-09-08 LAB — GLUCOSE, CAPILLARY
Glucose-Capillary: 235 mg/dL — ABNORMAL HIGH (ref 70–99)
Glucose-Capillary: 185 mg/dL — ABNORMAL HIGH (ref 70–99)

## 2011-09-08 MED ORDER — SENNOSIDES-DOCUSATE SODIUM 8.6-50 MG PO TABS: 1.0000 | ORAL_TABLET | Freq: Two times a day (BID) | ORAL | Status: AC

## 2011-09-08 MED ORDER — ENSURE COMPLETE PO LIQD: 237.0000 mL | Freq: Three times a day (TID) | ORAL | Status: DC

## 2011-09-08 MED ORDER — FUROSEMIDE 40 MG PO TABS: 40.0000 mg | ORAL_TABLET | Freq: Every day | ORAL | Status: DC

## 2011-09-08 MED ORDER — BISACODYL 10 MG RE SUPP: 10.0000 mg | Freq: Every day | RECTAL | Status: AC | PRN

## 2011-09-08 MED ORDER — HYDRALAZINE HCL 100 MG PO TABS: 100.0000 mg | ORAL_TABLET | Freq: Three times a day (TID) | ORAL | Status: DC

## 2011-09-08 MED ORDER — DEMECLOCYCLINE HCL 150 MG PO TABS: 300.0000 mg | ORAL_TABLET | Freq: Two times a day (BID) | ORAL | Status: AC

## 2011-09-08 NOTE — Progress Notes (Signed)
Physical Therapy Treatment Patient Details Name: [name] MRN: [id number] DOB: 1919-02-27 Today's Date: 09/08/2011 Time: 8295-6213 PT Time Calculation (min): 19 min  PT Assessment / Plan / Recommendation Comments on Treatment Session  continuing progress with more goal-directed behaviors and less posterior lean today    Follow Up Recommendations  Skilled nursing facility;Supervision/Assistance - 24 hour    Barriers to Discharge        Equipment Recommendations  Defer to next venue    Recommendations for Other Services    Frequency Min 3X/week   Plan Discharge plan remains appropriate;Frequency remains appropriate    Precautions / Restrictions Precautions Precautions: Fall Restrictions Weight Bearing Restrictions: No   Pertinent Vitals/Pain no apparent distress     Mobility  Bed Mobility Bed Mobility: Supine to Sit;Sitting - Scoot to [place] of Bed Rolling Right: 4: Min assist;With rail Right Sidelying to Sit: 3: Mod assist Supine to Sit: 3: Mod assist Supine to Sit: Patient Percentage: 60% Sitting - Scoot to Edge of Bed: 3: Mod assist Details for Bed Mobility Assistance: Continued cues for technique, initiation, and safety; Noted imporving motor planning, with better ability to stabilize trunk, weight shift anteriorly to unweigh hips for scooting to EOB Transfers Transfers: Sit to Stand;Stand to Sit Sit to Stand: 1: +2 Total assist;From bed Sit to Stand: Patient Percentage: 50% Stand to Sit: 1: +2 Total assist;To chair/3-in-1 Stand to Sit: Patient Percentage: 50% Details for Transfer Assistance: Verbal and tactile cues for safety and technique; Noted improving initiation of sit to stand with anteerior weight shift with less need to block feet from slipping forward; Still needed knees blocked for safety; Pt became a bit incontinent of urine with standing; assisted with hygeine once in chair Ambulation/Gait Ambulation/Gait Assistance: 1: +2 Total  assist Ambulation/Gait: Patient Percentage: 50% Ambulation Distance (Feet): 3 Feet (pivot steps bed to chair) Assistive device: 2 person hand held assist Ambulation/Gait Assistance Details: verbal and tactile cueing for upright posture; short steps; no buckling noted; pt pretty fatigued once seated Gait Pattern: Step-to pattern    Exercises     PT Diagnosis:    PT Problem List:   PT Treatment Interventions:     PT Goals Acute Rehab PT Goals PT Goal Formulation: With patient/family Time For Goal Achievement: 09/22/11 (Goal Update today) Potential to Achieve Goals: Good Pt will go Supine/Side to Sit: with supervision PT Goal: Supine/Side to Sit - Progress: Progressing toward goal Pt will go Sit to Supine/Side: with supervision (this goal continues to be appropriate) PT Goal: Sit to Supine/Side - Progress: Other (comment) Pt will go Sit to Stand: with mod assist;with upper extremity assist (this goal continues to be appropriate) PT Goal: Sit to Stand - Progress: Progressing toward goal Pt will go Stand to Sit: with mod assist;with upper extremity assist (this goal continues to be appropriate) PT Goal: Stand to Sit - Progress: Met Pt will Transfer Bed to Chair/Chair to Bed: with mod assist (this goal continues to be appropriate) Pt will Ambulate: 16 - 50 feet;with +2 total assist;with rolling walker PT Goal: Ambulate - Progress: Goal set today  Visit Information  Last PT Received On: 09/08/11 Assistance Needed: +2 PT/OT Co-Evaluation/Treatment: Yes    Subjective Data  Subjective: The first thing pt stated upon therapist entry to room was "2003"; Was able to decline having newspaper place near her, saying: "I can't see"  Patient Stated Goal: Still, none stated, but agreeable to OOB to chair   Cognition  Overall Cognitive Status: History of  cognitive impairments - at baseline Arousal/Alertness: Awake/alert Orientation Level: Disoriented to;Time;Situation Behavior During Session:  WFL for tasks performed Cognition - Other Comments: Noted improvements with goal-directed behaviors and statements, and imporving ability to voice her needs    Balance  Static Sitting Balance Static Sitting - Balance Support: No upper extremity supported;Feet supported Static Sitting - Level of Assistance: 4: Min assist Static Sitting - Comment/# of Minutes: 3-4 minutes EOB; less posterior lean today  End of Session PT - End of Session Equipment Utilized During Treatment: Gait belt;Oxygen Activity Tolerance: Patient limited by fatigue Patient left: in chair;with call bell/phone within reach;with chair alarm set    Van Clines [facility] [place], [place] [phone]  09/08/2011, 9:33 AM

## 2011-09-08 NOTE — Progress Notes (Addendum)
Subjective: Sleeping this AM, but easily arousable. Was able to joke with her daughter.  Objective: Vital signs in last 24 hours: Filed Vitals:   09/07/11 1719 09/07/11 2224 09/08/11 0445 09/08/11 0830  BP: 156/54 142/43 157/48 165/63  Pulse: 67 62 60 58  Temp: 97.6 F (36.4 C) 97.5 F (36.4 C) 97.3 F (36.3 C) 97 F (36.1 C)  TempSrc: Oral Oral Oral Oral  Resp: 20 20 20 18   Height:      Weight:  80 kg (176 lb 5.9 oz)    SpO2: 100% 100% 100% 97%   Weight change: 0.2 kg (7.1 oz)  Intake/Output Summary (Last 24 hours) at 09/08/11 1222 Last data filed at 09/08/11 0700  Gross per 24 hour  Intake    623 ml  Output      0 ml  Net    623 ml    Physical Exam: General: Awake, No acute distress. HEENT: EOMI. Neck: Supple CV: S1 and S2 Lungs: Crackles at bases bilaterally Abdomen: Soft, Nontender, slightly distended, +bowel sounds. Ext: Good pulses. Trace edema.  Lab Results: Basic Metabolic Panel:  Lab 09/08/11 1191 09/07/11 1735 09/07/11 0500 09/06/11 1705 09/06/11 0550 09/05/11 0635  NA 136 131* 136 133* 131* --  K 3.8 3.8 3.7 3.7 3.5 --  CL 90* 88* 91* 89* 86* --  CO2 38* 36* 36* 35* 36* --  GLUCOSE 177* 192* 103* 189* 146* --  BUN 23 23 23 21 19  --  CREATININE 1.00 0.93 1.00 0.99 1.00 --  CALCIUM 9.2 9.0 9.0 9.0 8.9 --  MG -- -- -- -- -- --  PHOS -- -- -- -- -- 4.8*   Liver Function Tests:  Lab 09/05/11 0635  AST --  ALT --  ALKPHOS --  BILITOT --  PROT --  ALBUMIN 3.1*   No results found for this basename: LIPASE:5,AMYLASE:5 in the last 168 hours No results found for this basename: AMMONIA:5 in the last 168 hours CBC: No results found for this basename: WBC:5,NEUTROABS:5,HGB:5,HCT:5,MCV:5,PLT:5 in the last 168 hours Cardiac Enzymes: No results found for this basename: CKTOTAL:5,CKMB:5,CKMBINDEX:5,TROPONINI:5 in the last 168 hours BNP (last 3 results)  Basename 08/30/11 1720 08/14/11 1756 06/21/11 2015  PROBNP 3591.0* 1067.0* 440.3   CBG:  Lab  09/08/11 0746 09/07/11 2222 09/07/11 1717 09/07/11 1205 09/07/11 0741  GLUCAP 184* 147* 202* 216* 125*   No results found for this basename: HGBA1C:5 in the last 72 hours Other Labs: No components found with this basename: POCBNP:3 No results found for this basename: DDIMER:2 in the last 168 hours No results found for this basename: CHOL:2,HDL:2,LDLCALC:2,TRIG:2,CHOLHDL:2,LDLDIRECT:2 in the last 168 hours  Lab 09/05/11 0635  TSH 1.618  T4TOTAL --  T3FREE --  FREET4 --  THYROIDAB --   No results found for this basename: VITAMINB12:2,FOLATE:2,FERRITIN:2,TIBC:2,IRON:2,RETICCTPCT:2 in the last 168 hours  Micro Results: Recent Results (from the past 240 hour(s))  MRSA PCR SCREENING     Status: Normal   Collection Time   08/30/11 11:34 PM      Component Value Range Status Comment   MRSA by PCR NEGATIVE  NEGATIVE Final   URINE CULTURE     Status: Normal   Collection Time   09/04/11  3:16 AM      Component Value Range Status Comment   Specimen Description URINE, CATHETERIZED   Final    Special Requests NONE   Final    Culture  Setup Time [id number]   Final    Colony Count NO GROWTH  Final    Culture NO GROWTH   Final    Report Status 09/05/2011 FINAL   Final     Studies/Results: No results found.  Medications: I have reviewed the patient's current medications. Scheduled Meds:    . antiseptic oral rinse  15 mL Mouth Rinse BID  . aspirin  81 mg Oral Daily  . atorvastatin  10 mg Oral q1800  . demeclocycline  300 mg Oral Q12H  . diltiazem  180 mg Oral Daily  . enoxaparin (LOVENOX) injection  40 mg Subcutaneous Q24H  . feeding supplement  237 mL Oral TID BM  . furosemide  40 mg Oral q1800  . hydrALAZINE  100 mg Oral Q8H  . insulin aspart  0-15 Units Subcutaneous TID WC  . insulin glargine  50 Units Subcutaneous Daily  . levothyroxine  100 mcg Oral QAC breakfast  . metoprolol  50 mg Oral BID  . polyethylene glycol  17 g Oral Daily  . senna-docusate  1 tablet Oral BID  .  sodium chloride  3 mL Intravenous Q12H  . sodium chloride  3 mL Intravenous Q12H  . DISCONTD: tolvaptan  15 mg Oral q morning - 10a   Continuous Infusions:  PRN Meds:.sodium chloride, bisacodyl, hydrALAZINE, ipratropium, levalbuterol, ondansetron, sodium chloride  Assessment/Plan: Hyponatremia  Appreciate renal input. Demeclocycline was restarted by renal on 09/06/2011. Tolvaptan discontinue. Sodium normal. Hyponatremia resolved, likely due diastolic CHF.  All psych medications and Losartan discontinued. To have electrolytes (BMET) checked weekly, and depending on patient's sodium, Dr. Virgina Organ (PCP) to make a decision about patient's referal to [facility]) as outpatient.   DM 2 uncontrolled Better controlled. Continue lantus 50 units SQ qhs. Continue sliding scale insulin.  COPD with CO2 retention (chronic obstructive pulmonary disease)  Likely precipitated by xanax, which has been discontinued.  Continue neb treatments. Improved.  Metabolic encephalopathy Multifactorial, likely due to hyponatremia and CO2 retention.  Patient seems at baseline.  Head CT obtained on admission on 08/30/2011 was negative for acute intracranial events.  Diastolic CHF, chronic Continue furosemide 40 mg daily, (home dose 40 mg twice daily), continue beta blocker. Troponin negative x4. Admission weight 85.6 kg. Continue diuresis. Weight on discharge 80 kg. If patient gains more than 2-3 lbs please contact her PCP for adjustment on lasix. To check patient's weight daily. Initially patient was placed on fluid restriction which was liberalized when patient was placed on Tolvaptan. Given patient will be discharged on Demeclocycline will not fluid restrict the patient to prevent dehydration.  Hypertension  Continue metoprolol, hydralazine, and diltiazem. Stable, losartan discontinued given hyponatremia. Consider clonidine if blood pressure continues to be elevated. Prefer the blood pressure to be on the higher  side, given her age.  History of paroxysmal atrial fibrillation during her last admission  Not a candidate for anticoagulation, and family does not want her to be on anticoagulation continue metoprolol and diltiazem. Stable.  CAD (coronary artery disease) History of CVA (cerebrovascular accident), continue aspirin   Dysphagia  Per speech therapy evaluation, Dysphagia 3 (Mechanical Soft);Thin liquid.  UTI Urinalysis checked on 09/04/2011 showed moderate leukocytes with 21-50 WBCs and many bacteria.  Urine culture shows no growth on 09/04/2011.  Discontinued ceftriaxone after 3 day empiric course given negative urine culture.  History of ESBL.  Urinary retention Intermittent I&O as needed.  Constipation Continue bowel regimen, patient has had a good bowel movement.  Hypokalemia Replace as needed.  Disposition Discharge patient today, discussed with daughter prior to discharge.   LOS:  9 days  [name] A, MD 09/08/2011, 12:22 PM

## 2011-09-08 NOTE — Progress Notes (Signed)
Inpatient Diabetes Program Recommendations  AACE/ADA: New Consensus Statement on Inpatient Glycemic Control (2009)  Target Ranges:  Prepandial:   less than 140 mg/dL      Peak postprandial:   less than 180 mg/dL (1-2 hours)      Critically ill patients:  140 - 180 mg/dL    Results for EULAH, WALKUP (MRN [id number]) as of 09/08/2011 11:48  Ref. Range 09/07/2011 07:41 09/07/2011 12:05 09/07/2011 17:17 09/07/2011 22:22  Glucose-Capillary Latest Range: 70-99 mg/dL 119 (H) 147 (H) 829 (H) 147 (H)    Patient's home insulin: Lantus 58 units QHS Novolog 4 units tid with meals  Inpatient Diabetes Program Recommendations Insulin - Basal: Current Lantus dose 50 units daily. Insulin - Meal Coverage: Please add meal coverage- Novolog 4 units tid with meals.  Note: Will follow. Ambrose Finland RN, MSN, CDE Diabetes Coordinator Inpatient Diabetes Program [phone]

## 2011-09-08 NOTE — Discharge Summary (Signed)
Discharge Summary  [name] MR#: [id number]  DOB:[birth year]  Date of Admission: 08/30/2011 Date of Discharge: 09/08/2011  Patient's PCP: Colon Branch, MD  Attending Physician:Cortnie Ringel A  Consults: Dr. Allena Katz, Dr. Arlean Hopping, and Dr. Lacy Duverney Renal.  Discharge Diagnoses: Principal Problem:  *Metabolic encephalopathy Active Problems:  COPD (chronic obstructive pulmonary disease)  Hypertension  CAD (coronary artery disease)  Late effects of CVA (cerebrovascular accident)  Hypoglycemia  Diastolic CHF, chronic  DM (diabetes mellitus)   Brief Admitting History and Physical 78 year old female with a history of previous CVA, insulin-dependent diabetes, dysphagia, dementia who is bed bound at baseline presents emergency department because of altered mental status on 08/30/2011.  Discharge Medications Medication List  As of 09/08/2011 12:38 PM   STOP taking these medications         ALPRAZolam 0.25 MG tablet      DULoxetine 30 MG capsule      Glucose Liqd      hydrALAZINE 50 MG tablet      losartan 100 MG tablet      metFORMIN 500 MG tablet      PRESCRIPTION MEDICATION         TAKE these medications         acetaminophen 325 MG tablet   Commonly known as: TYLENOL   Take 650 mg by mouth every 4 (four) hours as needed. For pain.      albuterol (5 MG/ML) 0.5% nebulizer solution   Commonly known as: PROVENTIL   Take 0.5 mLs (2.5 mg total) by nebulization every 2 (two) hours as needed for wheezing or shortness of breath.      aspirin 81 MG chewable tablet   Chew 81 mg by mouth daily.      beta carotene w/minerals tablet   Take 1 tablet by mouth daily.      bisacodyl 10 MG suppository   Commonly known as: DULCOLAX   Place 1 suppository (10 mg total) rectally daily as needed.      demeclocycline 150 MG tablet   Commonly known as: DECLOMYCIN   Take 2 tablets (300 mg total) by mouth every 12 (twelve) hours.      diltiazem 180 MG 24 hr capsule   Commonly  known as: CARDIZEM CD   Take 180 mg by mouth daily.      feeding supplement Liqd   Take 237 mLs by mouth 3 (three) times daily between meals.      furosemide 40 MG tablet   Commonly known as: LASIX   Take 1 tablet (40 mg total) by mouth daily.      hydrALAZINE 100 MG tablet   Commonly known as: APRESOLINE   Take 1 tablet (100 mg total) by mouth every 8 (eight) hours.      insulin aspart 100 UNIT/ML injection   Commonly known as: novoLOG   Inject 4 Units into the skin 3 (three) times daily with meals.      insulin glargine 100 UNIT/ML injection   Commonly known as: LANTUS   Inject 58 Units into the skin at bedtime.      ipratropium 0.02 % nebulizer solution   Commonly known as: ATROVENT   Take 0.5 mg by nebulization every 2 (two) hours as needed. For shortness of breath.      lactulose 10 GM/15ML solution   Commonly known as: CHRONULAC   Take 20 g by mouth 2 (two) times daily.      levothyroxine 100 MCG tablet   Commonly known as: SYNTHROID, LEVOTHROID  Take 100 mcg by mouth daily before breakfast.      LOPRESSOR 50 MG tablet   Generic drug: metoprolol   Take 50 mg by mouth 2 (two) times daily.      omeprazole 20 MG capsule   Commonly known as: PRILOSEC   Take 20 mg by mouth 2 (two) times daily.      polyethylene glycol packet   Commonly known as: MIRALAX / GLYCOLAX   Take 17 g by mouth daily.      senna-docusate 8.6-50 MG per tablet   Commonly known as: Senokot-S   Take 1 tablet by mouth 2 (two) times daily.      simvastatin 20 MG tablet   Commonly known as: ZOCOR   Take 20 mg by mouth daily.            Hospital Course: Hyponatremia  Patient was started on demeclocycline, which was held for few days before being restarted on 09/06/2011. Patient was also started on Tolvaptan, which was discontinued after sodium normalized. Likely due diastolic CHF and osmolar reset.  All psych medications and Losartan discontinued. To have electrolytes (BMET) checked  weekly, and depending on patient's sodium, Dr. Virgina Organ (PCP) to make a decision about patient's referal to renal [facility]) as outpatient.   DM 2 uncontrolled Better controlled. Continue lantus 50 units SQ qhs. Continue sliding scale insulin. Resume home diabetic regimen at discharge.  COPD with CO2 retention (chronic obstructive pulmonary disease)  Likely precipitated by xanax, which has been discontinued.  Continue neb treatments. Improved.  Metabolic encephalopathy Multifactorial, likely due to hyponatremia and CO2 retention.  Improved to baseline during the hospital stay.  Head CT obtained on admission on 08/30/2011 was negative for acute intracranial events.  Diastolic CHF, chronic Continued furosemide 40 mg daily, (home dose 40 mg twice daily), continue beta blocker. Troponin negative x4. Admission weight 85.6 kg. Weight on discharge 80 kg. To check patient's weight daily. If patient gains more than 2-3 lbs please contact her PCP for adjustment on lasix. Initially patient was placed on fluid restriction which was liberalized when patient was placed on Tolvaptan. Given patient will be discharged on Demeclocycline will not fluid restrict the patient to prevent dehydration.  Hypertension  Continue metoprolol, hydralazine, and diltiazem. Losartan discontinued given hyponatremia. Consider clonidine if blood pressure continues to be elevated. Prefer the blood pressure to be on the higher side, given her age. Further titration of antihypertensive medication to be done as outpatient.  History of paroxysmal atrial fibrillation during her last admission  Not a candidate for anticoagulation, and family does not want her to be on anticoagulation continue metoprolol and diltiazem. Stable.  CAD (coronary artery disease) History of CVA (cerebrovascular accident), continue aspirin   Dysphagia  Per speech therapy evaluation, Dysphagia 3 (Mechanical Soft);Thin liquid.  UTI Urinalysis checked on  09/04/2011 showed moderate leukocytes with 21-50 WBCs and many bacteria.  Urine culture shows no growth on 09/04/2011.  Discontinued ceftriaxone after 3 day empiric course given negative urine culture.  History of ESBL.  Urinary retention Intermittent I&O as needed.  Constipation Continue bowel regimen.  Hypokalemia Replace as needed.  Code status DNR/DNI.   Day of Discharge BP 165/63  Pulse 58  Temp 97 F (36.1 C) (Oral)  Resp 18  Ht 5\' 9"  (1.753 m)  Wt 80 kg (176 lb 5.9 oz)  BMI 26.05 kg/m2  SpO2 97%  Results for orders placed during the hospital encounter of 08/30/11 (from the past 48 hour(s))  BASIC METABOLIC  PANEL     Status: Abnormal   Collection Time   09/06/11  5:05 PM      Component Value Range Comment   Sodium 133 (*) 135 - 145 mEq/L    Potassium 3.7  3.5 - 5.1 mEq/L    Chloride 89 (*) 96 - 112 mEq/L    CO2 35 (*) 19 - 32 mEq/L    Glucose, Bld 189 (*) 70 - 99 mg/dL    BUN 21  6 - 23 mg/dL    Creatinine, Ser 9.60  0.50 - 1.10 mg/dL    Calcium 9.0  8.4 - 45.4 mg/dL    GFR calc non Af Amer 49 (*) >90 mL/min    GFR calc Af Amer 56 (*) >90 mL/min   GLUCOSE, CAPILLARY     Status: Abnormal   Collection Time   09/06/11  5:06 PM      Component Value Range Comment   Glucose-Capillary 201 (*) 70 - 99 mg/dL   BASIC METABOLIC PANEL     Status: Abnormal   Collection Time   09/07/11  5:00 AM      Component Value Range Comment   Sodium 136  135 - 145 mEq/L    Potassium 3.7  3.5 - 5.1 mEq/L    Chloride 91 (*) 96 - 112 mEq/L    CO2 36 (*) 19 - 32 mEq/L    Glucose, Bld 103 (*) 70 - 99 mg/dL    BUN 23  6 - 23 mg/dL    Creatinine, Ser 0.98  0.50 - 1.10 mg/dL    Calcium 9.0  8.4 - 11.9 mg/dL    GFR calc non Af Amer 48 (*) >90 mL/min    GFR calc Af Amer 56 (*) >90 mL/min   GLUCOSE, CAPILLARY     Status: Abnormal   Collection Time   09/07/11  7:41 AM      Component Value Range Comment   Glucose-Capillary 125 (*) 70 - 99 mg/dL   GLUCOSE, CAPILLARY     Status: Abnormal    Collection Time   09/07/11 12:05 PM      Component Value Range Comment   Glucose-Capillary 216 (*) 70 - 99 mg/dL   GLUCOSE, CAPILLARY     Status: Abnormal   Collection Time   09/07/11  5:17 PM      Component Value Range Comment   Glucose-Capillary 202 (*) 70 - 99 mg/dL   BASIC METABOLIC PANEL     Status: Abnormal   Collection Time   09/07/11  5:35 PM      Component Value Range Comment   Sodium 131 (*) 135 - 145 mEq/L    Potassium 3.8  3.5 - 5.1 mEq/L    Chloride 88 (*) 96 - 112 mEq/L    CO2 36 (*) 19 - 32 mEq/L    Glucose, Bld 192 (*) 70 - 99 mg/dL    BUN 23  6 - 23 mg/dL    Creatinine, Ser 1.47  0.50 - 1.10 mg/dL    Calcium 9.0  8.4 - 82.9 mg/dL    GFR calc non Af Amer 53 (*) >90 mL/min    GFR calc Af Amer 61 (*) >90 mL/min   GLUCOSE, CAPILLARY     Status: Abnormal   Collection Time   09/07/11 10:22 PM      Component Value Range Comment   Glucose-Capillary 147 (*) 70 - 99 mg/dL   BASIC METABOLIC PANEL     Status: Abnormal  Collection Time   09/08/11  5:50 AM      Component Value Range Comment   Sodium 136  135 - 145 mEq/L    Potassium 3.8  3.5 - 5.1 mEq/L    Chloride 90 (*) 96 - 112 mEq/L    CO2 38 (*) 19 - 32 mEq/L    Glucose, Bld 177 (*) 70 - 99 mg/dL    BUN 23  6 - 23 mg/dL    Creatinine, Ser 4.54  0.50 - 1.10 mg/dL    Calcium 9.2  8.4 - 09.8 mg/dL    GFR calc non Af Amer 48 (*) >90 mL/min    GFR calc Af Amer 56 (*) >90 mL/min   GLUCOSE, CAPILLARY     Status: Abnormal   Collection Time   09/08/11  7:46 AM      Component Value Range Comment   Glucose-Capillary 184 (*) 70 - 99 mg/dL   GLUCOSE, CAPILLARY     Status: Abnormal   Collection Time   09/08/11 12:06 PM      Component Value Range Comment   Glucose-Capillary 235 (*) 70 - 99 mg/dL     Dg Chest 1 View  04/11/1476  *RADIOLOGY REPORT*  Clinical Data: Shortness of breath.  CHEST - 1 VIEW  Comparison: 08/14/2011  Findings: The patient has a slightly increased interstitial accentuation consistent with mild pulmonary  edema.  Chronic cardiomegaly.  Pulmonary vascularity has increased.  No discrete effusions.  IMPRESSION: Increased slight pulmonary edema.  Original Report Authenticated By: Gwynn Burly, M.D.   Dg Chest 2 View  09/04/2011  *RADIOLOGY REPORT*  Clinical Data: Recurrent hyponatremia.  Rule out aspiration pneumonia versus malignancy.  CHEST - 2 VIEW  Comparison: 08/30/2011  Findings: Lateral view degraded by patient arm position.  Mild hyperinflation on the lateral view.  Low volumes on the frontal. Cardiomegaly accentuated by AP portable technique.  Small bilateral pleural effusions.  The right effusion is likely similar. The left effusion is new or slightly increased. No pneumothorax. Mild bibasilar airspace disease is slightly increased.  IMPRESSION: Cardiomegaly with small bilateral pleural effusions and bibasilar atelectasis.  No evidence of pneumonia or primary malignancy within the chest.  Original Report Authenticated By: Consuello Bossier, M.D.   Dg Abd 1 View  08/15/2011  *RADIOLOGY REPORT*  Clinical Data: Abdominal pain and distention.  ABDOMEN - 1 VIEW  Comparison: Two-view abdomen x-ray 06/27/2011.  Findings: Bowel gas pattern unremarkable without evidence of obstruction or significant ileus.  Very large amount of stool throughout the colon from cecum to rectum.  Calcified fibroid in the midline of the pelvis.  Degenerative changes involving the lumbar spine and both hips.  IMPRESSION: No acute abdominal abnormality.  Large stool burden.  Original Report Authenticated By: Arnell Sieving, M.D.   Ct Head Wo Contrast  08/30/2011  *RADIOLOGY REPORT*  Clinical Data: Altered mental status  CT HEAD WITHOUT CONTRAST  Technique:  Contiguous axial images were obtained from the base of the skull through the vertex without contrast.  Comparison: CT 08/15/2011 and 02/02/2011  Findings: Encephalomalacia in the left basal ganglia, extending into the left centrum semiovale  is compatible with prior infarction  stable in appearance.  The ventricles are within normal limits.  Negative for hydrocephalus, hemorrhage, mass effect, mass lesion, or evidence of acute infarction. Heavy intracranial atherosclerotic calcification noted.  The visualized paranasal sinuses, mastoid air cells, and middle ears are clear.  The skull is intact.  Soft tissues of the scalp are  symmetric.  IMPRESSION:  1.  No acute intracranial abnormality. 2.  Remote infarction in the left basal ganglia and left centrum semiovale.  Original Report Authenticated By: Britta Mccreedy, M.D.   Ct Head Wo Contrast  08/15/2011  *RADIOLOGY REPORT*  Clinical Data: Increasing confusion. Altered mental status.  Prior stroke.  CT HEAD WITHOUT CONTRAST  Technique:  Contiguous axial images were obtained from the base of the skull through the vertex without contrast.  Comparison: CT scan dated 02/02/2011  Findings: There is no acute intracranial hemorrhage, infarction, or mass lesion.  There is an old left basal ganglia infarct extending into the left centrum semiovale.  Diffuse mild cerebral cortical and cerebellar atrophy.  No osseous abnormality.  IMPRESSION: No acute abnormalities.  Old left brain stroke.  Original Report Authenticated By: Gwynn Burly, M.D.   Dg Chest Port 1 View  08/30/2011  *RADIOLOGY REPORT*  Clinical Data: Altered mental status, wheezing  PORTABLE CHEST - 1 VIEW  Comparison: 08/18/2011  Findings: Cardiomediastinal silhouette is stable.  Mild interstitial prominence bilaterally without convincing pulmonary edema.  No focal infiltrate.  IMPRESSION: Mild interstitial prominence bilaterally without convincing pulmonary edema.  No focal infiltrate.  Original Report Authenticated By: Natasha Mead, M.D.   Dg Chest Port 1 View  08/14/2011  *RADIOLOGY REPORT*  Clinical Data: Shortness of breath, confusion and wheezing.  The  PORTABLE CHEST - 1 VIEW  Comparison: 06/26/2011  Findings: There likely is a chronic component of mild interstitial edema and  chronic lung disease.  There is no evidence of overt airspace edema or pleural fluid.  Heart size is stable.  IMPRESSION: Chronic lung disease and probable chronic component of interstitial edema.  Original Report Authenticated By: Reola Calkins, M.D.   Disposition: SNF  Diet: Dysphagia 3 diet with Thin liquids.  Activity: Resume as tolerated.  Follow-up Appts: Discharge Orders    Future Orders Please Complete By Expires   Increase activity slowly      Discharge instructions      Comments:   Electrolytes (BMET) checked weekly, and depending on patient's sodium, Dr. Virgina Organ (PCP) to make a decision about patient's referal to renal [facility]) as outpatient. Check patient's weight daily, if patient gains more than 2-3 lbs please contact her PCP for adjustment on lasix. Continue PT at SNF.  Diet: Dysphagia 3 (Mechanical Soft);Thin liquid.     TESTS THAT NEED FOLLOW-UP None  Time spent on discharge, talking to the patient, and coordinating care: 40 mins.   Signed: Cristal Ford, MD 09/08/2011, 12:38 PM

## 2011-09-08 NOTE — Progress Notes (Signed)
OT Treatment Note   09/08/11 0900  OT Visit Information  Last OT Received On 09/08/11  Assistance Needed +2  OT Time Calculation  OT Start Time 0836  OT Stop Time 0855  OT Time Calculation (min) 19 min  Precautions  Precautions Fall  Restrictions  Weight Bearing Restrictions No  ADL  Grooming Performed;Set up;Supervision/safety  Where Assessed - Grooming Unsupported sitting  Lower Body Dressing Performed;Maximal assistance (with doff bil socks)  Where Assessed - Lower Body Dressing Unsupported sitting (assist for rt. sock doff)  Network engineer Total assistance  Toilet Transfer: Patient Percentage 50%  Toilet Transfer Method Stand pivot  Toilet Transfer Equipment Other (comment) (recliner)  Transfers/Ambulation Related to ADLs Max verbal and tactile cues for hand placement and transfer technique  ADL Comments Pt. with functional progress today. Pt. with increased participation and appropriate responses when pt. able to hear question. pt. very HOH.   Cognition  Overall Cognitive Status History of cognitive impairments - at baseline  Arousal/Alertness Awake/alert  Orientation Level Situation;Disoriented to (Pt. able to state at [facility] hospital)  Behavior During Session [facility] for tasks performed  Cognition - Other Comments Pt. much more coherant today and appropriate in responses. Able to state being at [facility] hospital.  Bed Mobility  Bed Mobility Rolling Right;Right Sidelying to Sit;Sitting - Scoot to Edge of Bed  Rolling Right 4: Min assist;With rail  Right Sidelying to Sit 3: Mod assist  Sitting - Scoot to Edge of Bed 3: Mod assist  Details for Bed Mobility Assistance Max verbal and tactile cues to facilitate transfer. pt. with increased inititation with transfers and mobility when given increased time.  Transfers  Transfers Sit to Stand  Sit to Stand 1: +2 Total assist;From bed  Sit to Stand: Patient Percentage 50%  Details for Transfer Assistance Mod verbal cues  for hand placement and technique and facilitation at hips and trunk to facilitate forward weight shift   OT - End of Session  Equipment Utilized During Treatment Gait belt  Activity Tolerance Patient tolerated treatment well  Patient left in chair;with call bell/phone within reach  Nurse Communication Mobility status  OT Assessment/Plan  Comments on Treatment Session Pt. much more coherent today and following commands with appropriate responses when prompted. Pt. moving better functionally as welll.  OT Plan Discharge plan remains appropriate  OT Frequency Min 2X/week  Follow Up Recommendations Skilled nursing facility  Equipment Recommended Defer to next venue  Acute Rehab OT Goals  OT Goal Formulation Patient unable to participate in goal setting  Time For Goal Achievement 09/16/11  Potential to Achieve Goals Fair  ADL Goals  Pt Will Perform Grooming with set-up;Sitting, chair;Supported  ADL Goal: Grooming - Progress Met  Pt Will Transfer to Toilet with mod assist;with DME;3-in-1;Stand pivot transfer  ADL Goal: Toilet Transfer - Progress Progressing toward goals  OT General Charges  $OT Visit 1 Procedure      Cassandria Anger, OTR/L Pager: [phone] 09/08/2011 .

## 2011-09-08 NOTE — Clinical Social Work Note (Signed)
Patient discharging back to [facility] today. Discharge information forwarded to facility. CSW facilitated transport to SNF via ambulance. Family at bedside and aware of discharge and ambulance transport.  Genelle Bal, MSW, LCSW [phone]

## 2011-09-08 NOTE — Progress Notes (Signed)
Was called by Dr. Betti Cruz to settle plan dispute between Dr. Allena Katz and Arlean Hopping.    Patient was started on samsca for SIADH/reset osmastat and had a great response sodium up to 136 yesterday and today.  It is true that samsca should not be given for a sodium over 135 and would be worried as OP for monitoring purposes as well as patient being able to get it covered as OP by insurance.    I would discharge her on demeclocycline and lasix both at current doses.  Would leave it up to her PCP if she needs official renal follow up, she may not if sodium stays in a reasonable range.    Damyn Weitzel A

## 2011-09-22 ENCOUNTER — Emergency Department (HOSPITAL_COMMUNITY): Payer: Medicare Other

## 2011-09-22 ENCOUNTER — Inpatient Hospital Stay (HOSPITAL_COMMUNITY)
Admission: EM | Admit: 2011-09-22 | Discharge: 2011-09-30 | Disposition: A | Discharge status: Skilled Nursing Facility | Payer: Medicare Other | Source: Home / Self Care | Attending: Internal Medicine | Admitting: Internal Medicine

## 2011-09-22 ENCOUNTER — Encounter (HOSPITAL_COMMUNITY): Payer: Self-pay | Admitting: Emergency Medicine

## 2011-09-22 DIAGNOSIS — E860 Dehydration: Secondary | ICD-10-CM | POA: Diagnosis present

## 2011-09-22 DIAGNOSIS — E039 Hypothyroidism, unspecified: Secondary | ICD-10-CM | POA: Diagnosis present

## 2011-09-22 DIAGNOSIS — A498 Other bacterial infections of unspecified site: Secondary | ICD-10-CM | POA: Diagnosis present

## 2011-09-22 DIAGNOSIS — E876 Hypokalemia: Secondary | ICD-10-CM | POA: Diagnosis present

## 2011-09-22 DIAGNOSIS — G9341 Metabolic encephalopathy: Secondary | ICD-10-CM | POA: Diagnosis present

## 2011-09-22 DIAGNOSIS — A419 Sepsis, unspecified organism: Secondary | ICD-10-CM | POA: Diagnosis present

## 2011-09-22 DIAGNOSIS — K560 Paralytic ileus: Secondary | ICD-10-CM | POA: Diagnosis present

## 2011-09-22 DIAGNOSIS — Z7401 Bed confinement status: Secondary | ICD-10-CM

## 2011-09-22 DIAGNOSIS — G9340 Encephalopathy, unspecified: Secondary | ICD-10-CM

## 2011-09-22 DIAGNOSIS — E162 Hypoglycemia, unspecified: Secondary | ICD-10-CM

## 2011-09-22 DIAGNOSIS — D72829 Elevated white blood cell count, unspecified: Secondary | ICD-10-CM | POA: Diagnosis present

## 2011-09-22 DIAGNOSIS — I10 Essential (primary) hypertension: Secondary | ICD-10-CM | POA: Diagnosis present

## 2011-09-22 DIAGNOSIS — E222 Syndrome of inappropriate secretion of antidiuretic hormone: Secondary | ICD-10-CM | POA: Diagnosis present

## 2011-09-22 DIAGNOSIS — B9620 Unspecified Escherichia coli [E. coli] as the cause of diseases classified elsewhere: Secondary | ICD-10-CM | POA: Diagnosis present

## 2011-09-22 DIAGNOSIS — I509 Heart failure, unspecified: Secondary | ICD-10-CM | POA: Diagnosis present

## 2011-09-22 DIAGNOSIS — J4489 Other specified chronic obstructive pulmonary disease: Secondary | ICD-10-CM | POA: Diagnosis present

## 2011-09-22 DIAGNOSIS — I2510 Atherosclerotic heart disease of native coronary artery without angina pectoris: Secondary | ICD-10-CM | POA: Diagnosis present

## 2011-09-22 DIAGNOSIS — F0390 Unspecified dementia without behavioral disturbance: Secondary | ICD-10-CM | POA: Diagnosis present

## 2011-09-22 DIAGNOSIS — I252 Old myocardial infarction: Secondary | ICD-10-CM

## 2011-09-22 DIAGNOSIS — I498 Other specified cardiac arrhythmias: Secondary | ICD-10-CM

## 2011-09-22 DIAGNOSIS — E785 Hyperlipidemia, unspecified: Secondary | ICD-10-CM | POA: Diagnosis present

## 2011-09-22 DIAGNOSIS — E119 Type 2 diabetes mellitus without complications: Secondary | ICD-10-CM | POA: Diagnosis present

## 2011-09-22 DIAGNOSIS — I5032 Chronic diastolic (congestive) heart failure: Secondary | ICD-10-CM | POA: Diagnosis present

## 2011-09-22 DIAGNOSIS — R040 Epistaxis: Secondary | ICD-10-CM | POA: Diagnosis present

## 2011-09-22 DIAGNOSIS — J9600 Acute respiratory failure, unspecified whether with hypoxia or hypercapnia: Secondary | ICD-10-CM | POA: Diagnosis not present

## 2011-09-22 DIAGNOSIS — R1310 Dysphagia, unspecified: Secondary | ICD-10-CM | POA: Diagnosis present

## 2011-09-22 DIAGNOSIS — K2970 Gastritis, unspecified, without bleeding: Secondary | ICD-10-CM | POA: Diagnosis present

## 2011-09-22 DIAGNOSIS — Z8673 Personal history of transient ischemic attack (TIA), and cerebral infarction without residual deficits: Secondary | ICD-10-CM

## 2011-09-22 DIAGNOSIS — F329 Major depressive disorder, single episode, unspecified: Secondary | ICD-10-CM | POA: Diagnosis present

## 2011-09-22 DIAGNOSIS — R410 Disorientation, unspecified: Secondary | ICD-10-CM

## 2011-09-22 DIAGNOSIS — R001 Bradycardia, unspecified: Secondary | ICD-10-CM

## 2011-09-22 DIAGNOSIS — F29 Unspecified psychosis not due to a substance or known physiological condition: Secondary | ICD-10-CM

## 2011-09-22 DIAGNOSIS — K5900 Constipation, unspecified: Secondary | ICD-10-CM | POA: Diagnosis present

## 2011-09-22 DIAGNOSIS — N390 Urinary tract infection, site not specified: Secondary | ICD-10-CM

## 2011-09-22 DIAGNOSIS — R6510 Systemic inflammatory response syndrome (SIRS) of non-infectious origin without acute organ dysfunction: Secondary | ICD-10-CM | POA: Diagnosis present

## 2011-09-22 DIAGNOSIS — J9601 Acute respiratory failure with hypoxia: Secondary | ICD-10-CM | POA: Diagnosis present

## 2011-09-22 DIAGNOSIS — J449 Chronic obstructive pulmonary disease, unspecified: Secondary | ICD-10-CM | POA: Diagnosis present

## 2011-09-22 DIAGNOSIS — E236 Other disorders of pituitary gland: Secondary | ICD-10-CM | POA: Diagnosis present

## 2011-09-22 DIAGNOSIS — R509 Fever, unspecified: Secondary | ICD-10-CM

## 2011-09-22 DIAGNOSIS — T68XXXA Hypothermia, initial encounter: Secondary | ICD-10-CM

## 2011-09-22 DIAGNOSIS — Z7982 Long term (current) use of aspirin: Secondary | ICD-10-CM

## 2011-09-22 DIAGNOSIS — F3289 Other specified depressive episodes: Secondary | ICD-10-CM | POA: Diagnosis present

## 2011-09-22 DIAGNOSIS — N12 Tubulo-interstitial nephritis, not specified as acute or chronic: Secondary | ICD-10-CM | POA: Diagnosis present

## 2011-09-22 DIAGNOSIS — F411 Generalized anxiety disorder: Secondary | ICD-10-CM | POA: Diagnosis present

## 2011-09-22 DIAGNOSIS — K219 Gastro-esophageal reflux disease without esophagitis: Secondary | ICD-10-CM | POA: Diagnosis present

## 2011-09-22 DIAGNOSIS — Z66 Do not resuscitate: Secondary | ICD-10-CM | POA: Diagnosis present

## 2011-09-22 DIAGNOSIS — I471 Supraventricular tachycardia: Secondary | ICD-10-CM | POA: Diagnosis not present

## 2011-09-22 DIAGNOSIS — F341 Dysthymic disorder: Secondary | ICD-10-CM | POA: Diagnosis present

## 2011-09-22 HISTORY — DX: Anemia, unspecified: D64.9

## 2011-09-22 HISTORY — DX: Elevated white blood cell count, unspecified: D72.829

## 2011-09-22 HISTORY — DX: Paralytic ileus: K56.0

## 2011-09-22 HISTORY — DX: Hypokalemia: E87.6

## 2011-09-22 HISTORY — DX: Pleural effusion, not elsewhere classified: J90

## 2011-09-22 LAB — GLUCOSE, CAPILLARY
Glucose-Capillary: 116 mg/dL — ABNORMAL HIGH (ref 70–99)
Glucose-Capillary: 173 mg/dL — ABNORMAL HIGH (ref 70–99)
Glucose-Capillary: 153 mg/dL — ABNORMAL HIGH (ref 70–99)
Glucose-Capillary: 175 mg/dL — ABNORMAL HIGH (ref 70–99)

## 2011-09-22 LAB — HEMOGLOBIN A1C: Mean Plasma Glucose: 154 mg/dL — ABNORMAL HIGH (ref <117)

## 2011-09-22 LAB — URINALYSIS, ROUTINE W REFLEX MICROSCOPIC
Bilirubin Urine: NEGATIVE
Glucose, UA: NEGATIVE mg/dL
Hgb urine dipstick: NEGATIVE
Ketones, ur: NEGATIVE mg/dL
Protein, ur: 30 mg/dL — AB
Urobilinogen, UA: 0.2 mg/dL (ref 0.0–1.0)

## 2011-09-22 LAB — COMPREHENSIVE METABOLIC PANEL
ALT: 22 U/L (ref 0–35)
AST: 21 U/L (ref 0–37)
Albumin: 2.5 g/dL — ABNORMAL LOW (ref 3.5–5.2)
Alkaline Phosphatase: 99 U/L (ref 39–117)
Chloride: 94 mEq/L — ABNORMAL LOW (ref 96–112)
Potassium: 4.1 mEq/L (ref 3.5–5.1)
Sodium: 135 mEq/L (ref 135–145)
Total Bilirubin: 0.2 mg/dL — ABNORMAL LOW (ref 0.3–1.2)
Total Protein: 5.7 g/dL — ABNORMAL LOW (ref 6.0–8.3)

## 2011-09-22 LAB — CREATININE, SERUM: GFR calc Af Amer: 70 mL/min — ABNORMAL LOW (ref >90)

## 2011-09-22 LAB — POCT I-STAT, CHEM 8
BUN: 12 mg/dL (ref 6–23)
Chloride: 95 mEq/L — ABNORMAL LOW (ref 96–112)
Creatinine, Ser: 1 mg/dL (ref 0.50–1.10)
Potassium: 4.9 mEq/L (ref 3.5–5.1)
Sodium: 135 mEq/L (ref 135–145)

## 2011-09-22 LAB — CBC
HCT: 32.5 % — ABNORMAL LOW (ref 36.0–46.0)
MCH: 29.7 pg (ref 26.0–34.0)
MCHC: 32.1 g/dL (ref 30.0–36.0)
MCV: 91.8 fL (ref 78.0–100.0)
Platelets: 277 10*3/uL (ref 150–400)
RBC: 3.54 MIL/uL — ABNORMAL LOW (ref 3.87–5.11)
RDW: 14 % (ref 11.5–15.5)
WBC: 10.4 10*3/uL (ref 4.0–10.5)
WBC: 10.9 10*3/uL — ABNORMAL HIGH (ref 4.0–10.5)

## 2011-09-22 LAB — URINE MICROSCOPIC-ADD ON

## 2011-09-22 LAB — POCT I-STAT TROPONIN I: Troponin i, poc: 0.01 ng/mL (ref 0.00–0.08)

## 2011-09-22 LAB — PROCALCITONIN: Procalcitonin: <0.1 ng/mL

## 2011-09-22 LAB — CK TOTAL AND CKMB (NOT AT ARMC): Relative Index: INVALID (ref 0.0–2.5)

## 2011-09-22 LAB — MRSA PCR SCREENING: MRSA by PCR: NEGATIVE

## 2011-09-22 MED ORDER — DEXTROSE 5 % IV SOLN
1.0000 g | INTRAVENOUS | Status: DC
  Administered 2011-09-23 – 2011-09-24 (×2): 1 g via INTRAVENOUS
  Filled 2011-09-22 (×4): qty 10

## 2011-09-22 MED ORDER — DEXTROSE 50 % IV SOLN: 50.0000 mL | Freq: Once | INTRAVENOUS | Status: DC | PRN

## 2011-09-22 MED ORDER — GLUCOSE 40 % PO GEL: 1.0000 | Freq: Once | ORAL | Status: AC | PRN

## 2011-09-22 MED ORDER — DEXTROSE 5 % IV SOLN: INTRAVENOUS | Status: DC

## 2011-09-22 MED ORDER — LEVOTHYROXINE SODIUM 100 MCG PO TABS
100.0000 ug | ORAL_TABLET | Freq: Every day | ORAL | Status: DC
  Administered 2011-09-22 – 2011-09-30 (×9): 100 ug via ORAL
  Filled 2011-09-22 (×14): qty 1

## 2011-09-22 MED ORDER — KCL IN DEXTROSE-NACL 20-5-0.45 MEQ/L-%-% IV SOLN
Freq: Once | INTRAVENOUS | Status: AC
  Administered 2011-09-22: 04:00:00 via INTRAVENOUS
  Filled 2011-09-22: qty 1000

## 2011-09-22 MED ORDER — SODIUM CHLORIDE 0.9 % IV SOLN
INTRAVENOUS | Status: DC
  Administered 2011-09-22: 1000 mL via INTRAVENOUS
  Administered 2011-09-22: 19:00:00 via INTRAVENOUS

## 2011-09-22 MED ORDER — SODIUM CHLORIDE 0.9 % IJ SOLN
3.0000 mL | Freq: Two times a day (BID) | INTRAMUSCULAR | Status: DC
  Administered 2011-09-22 – 2011-09-30 (×11): 3 mL via INTRAVENOUS

## 2011-09-22 MED ORDER — GLUCOSE 40 % PO GEL: 1.0000 | ORAL | Status: DC | PRN

## 2011-09-22 MED ORDER — DEMECLOCYCLINE HCL 150 MG PO TABS
300.0000 mg | ORAL_TABLET | Freq: Two times a day (BID) | ORAL | Status: DC
  Administered 2011-09-22 – 2011-09-27 (×11): 300 mg via ORAL
  Filled 2011-09-22 (×17): qty 2

## 2011-09-22 MED ORDER — METOPROLOL TARTRATE 1 MG/ML IV SOLN
5.0000 mg | Freq: Once | INTRAVENOUS | Status: AC
  Administered 2011-09-22: 5 mg via INTRAVENOUS
  Filled 2011-09-22: qty 5

## 2011-09-22 MED ORDER — BIOTENE DRY MOUTH MT LIQD
15.0000 mL | Freq: Two times a day (BID) | OROMUCOSAL | Status: DC
  Administered 2011-09-22 – 2011-09-30 (×16): 15 mL via OROMUCOSAL

## 2011-09-22 MED ORDER — INSULIN ASPART 100 UNIT/ML ~~LOC~~ SOLN
0.0000 [IU] | SUBCUTANEOUS | Status: DC
  Administered 2011-09-22: 1 [IU] via SUBCUTANEOUS
  Administered 2011-09-22 (×3): 2 [IU] via SUBCUTANEOUS

## 2011-09-22 MED ORDER — METOPROLOL TARTRATE 50 MG PO TABS
50.0000 mg | ORAL_TABLET | Freq: Two times a day (BID) | ORAL | Status: DC
  Administered 2011-09-22 – 2011-09-30 (×14): 50 mg via ORAL
  Filled 2011-09-22 (×20): qty 1

## 2011-09-22 MED ORDER — DEXTROSE 5 % IV SOLN: 1.0000 g | INTRAVENOUS | Status: DC

## 2011-09-22 MED ORDER — DILTIAZEM HCL ER COATED BEADS 180 MG PO CP24
180.0000 mg | ORAL_CAPSULE | Freq: Every day | ORAL | Status: DC
  Administered 2011-09-22 – 2011-09-30 (×9): 180 mg via ORAL
  Filled 2011-09-22 (×10): qty 1

## 2011-09-22 MED ORDER — ACETAMINOPHEN 325 MG PO TABS: 650.0000 mg | ORAL_TABLET | ORAL | Status: DC | PRN

## 2011-09-22 MED ORDER — PANTOPRAZOLE SODIUM 40 MG PO TBEC
40.0000 mg | DELAYED_RELEASE_TABLET | Freq: Every day | ORAL | Status: DC
  Administered 2011-09-22 – 2011-09-30 (×9): 40 mg via ORAL
  Filled 2011-09-22 (×9): qty 1

## 2011-09-22 MED ORDER — DEXTROSE 50 % IV SOLN: 25.0000 mL | Freq: Once | INTRAVENOUS | Status: DC | PRN

## 2011-09-22 MED ORDER — ENSURE COMPLETE PO LIQD
237.0000 mL | Freq: Three times a day (TID) | ORAL | Status: DC
  Administered 2011-09-22 – 2011-09-26 (×8): 237 mL via ORAL
  Filled 2011-09-22 (×2): qty 237

## 2011-09-22 MED ORDER — INSULIN ASPART 100 UNIT/ML ~~LOC~~ SOLN
0.0000 [IU] | Freq: Three times a day (TID) | SUBCUTANEOUS | Status: DC
  Administered 2011-09-23: 3 [IU] via SUBCUTANEOUS
  Administered 2011-09-23 (×2): 1 [IU] via SUBCUTANEOUS
  Administered 2011-09-24 (×3): 2 [IU] via SUBCUTANEOUS
  Administered 2011-09-25: 3 [IU] via SUBCUTANEOUS
  Administered 2011-09-25 – 2011-09-26 (×4): 2 [IU] via SUBCUTANEOUS
  Administered 2011-09-26: 3 [IU] via SUBCUTANEOUS
  Administered 2011-09-27 (×2): 2 [IU] via SUBCUTANEOUS
  Administered 2011-09-27: 3 [IU] via SUBCUTANEOUS
  Administered 2011-09-28: 2 [IU] via SUBCUTANEOUS
  Administered 2011-09-28: 3 [IU] via SUBCUTANEOUS
  Administered 2011-09-28 – 2011-09-29 (×4): 2 [IU] via SUBCUTANEOUS
  Administered 2011-09-30: 3 [IU] via SUBCUTANEOUS
  Administered 2011-09-30: 2 [IU] via SUBCUTANEOUS

## 2011-09-22 MED ORDER — ENOXAPARIN SODIUM 40 MG/0.4ML ~~LOC~~ SOLN
40.0000 mg | SUBCUTANEOUS | Status: DC
  Administered 2011-09-22 – 2011-09-30 (×9): 40 mg via SUBCUTANEOUS
  Filled 2011-09-22 (×12): qty 0.4

## 2011-09-22 MED ORDER — ASPIRIN 81 MG PO CHEW
81.0000 mg | CHEWABLE_TABLET | Freq: Every day | ORAL | Status: DC
  Administered 2011-09-22 – 2011-09-30 (×9): 81 mg via ORAL
  Filled 2011-09-22 (×11): qty 1

## 2011-09-22 MED ORDER — OCUVITE PO TABS
1.0000 | ORAL_TABLET | Freq: Every day | ORAL | Status: DC
  Administered 2011-09-22 – 2011-09-30 (×9): 1 via ORAL
  Filled 2011-09-22 (×10): qty 1

## 2011-09-22 MED ORDER — DEXTROSE 5 % IV SOLN
1.0000 g | Freq: Once | INTRAVENOUS | Status: AC
  Administered 2011-09-22: 1 g via INTRAVENOUS
  Filled 2011-09-22: qty 10

## 2011-09-22 MED ORDER — IPRATROPIUM BROMIDE 0.02 % IN SOLN: 0.5000 mg | RESPIRATORY_TRACT | Status: DC | PRN

## 2011-09-22 MED ORDER — ALBUTEROL SULFATE (5 MG/ML) 0.5% IN NEBU: 2.5000 mg | INHALATION_SOLUTION | RESPIRATORY_TRACT | Status: DC | PRN

## 2011-09-22 NOTE — ED Notes (Signed)
Called lab regarding results: CK/CKMB is currently being spun down.

## 2011-09-22 NOTE — Progress Notes (Signed)
Pt transferred via stretcher from ED.  VSS on arrival.  Bair hugger on pt at this time.

## 2011-09-22 NOTE — Progress Notes (Signed)
Contacted MD to discuss possible removal of foley, MD requested that we continue the foley at this time.   Lora Havens RN

## 2011-09-22 NOTE — Clinical Social Work Psychosocial (Signed)
     Clinical Social Work Department BRIEF PSYCHOSOCIAL ASSESSMENT 09/22/2011  Patient:  [name], [name]     Account Number:  [id number]     Admit date:  09/22/2011  Clinical Social Worker:  Margaree Mackintosh  Date/Time:  09/22/2011 04:05 PM  Referred by:  Care Management  Date Referred:  09/22/2011 Referred for  SNF Placement   Other Referral:   Interview type:  Family Other interview type:    PSYCHOSOCIAL DATA Living Status:  FACILITY Admitted from facility:  [facility] Level of care:  Skilled Nursing Facility Primary support name:  Minnie-Jo:[phone] Primary support relationship to patient:  CHILD, ADULT Degree of support available:   Adequate.    CURRENT CONCERNS Current Concerns  Post-Acute Placement   Other Concerns:    SOCIAL WORK ASSESSMENT / PLAN Clinical Social Worker recieved referral indicating that pt is from [facility].  CSW reviewed chart and met with pt's dtr.  Dtr confirmed plan for return to SNF once medically stable.   CSW provided opportunity for dtr to process questions/concerns. CSW to continue to follow and assist as needed.   Assessment/plan status:  Information/Referral to [facility] Other assessment/ plan:   Information/referral to community resources:   SNF    PATIENTS/FAMILYS RESPONSE TO PLAN OF CARE: Pt currently sleeping, dtr at bedside.  Dtr was receptive to CSW intervention.  CSW to continue to follow and assist as needed.

## 2011-09-22 NOTE — ED Notes (Signed)
Patient transported to CT 

## 2011-09-22 NOTE — Progress Notes (Signed)
Utilization Review Completed.  Phallon Haydu T  09/22/2011  

## 2011-09-22 NOTE — Progress Notes (Signed)
NSMT notified nurse that pt heart rate was in 130's-140's briefly.  Now sinus rhythm and heart rate in 70's.  EKG strip showed run of SVT around 10:59.  Notified MD, Weston Settle NP ordered 5mg  Lopressor IV.  Will continue to monitor pt.  Lora Havens Dawn

## 2011-09-22 NOTE — Progress Notes (Deleted)
[facility]  Interim history: 78 year old female with a history of previous CVA, insulin-dependent diabetes, dysphagia, dementia with bed bound status at baseline who presents to the emergency department because of altered mental status. She experienced a similar episode several weeks ago. She's been having episodes of hypoglycemia and on the morning of admission her sugar was less than 60. Recent outpatient diagnosis of UTI and  Status treatment with a 3 day course of antibiotics. Her daughter says that she has not been running fevers. She's not had any nausea vomiting or diarrhea. She has a history of congestive heart failure also however they did not think that she's been short of breath or having any difficulty breathing. She seems to be overly sedated him. She was discharged on Xanax twice a day and was only supposed to be as needed however they think she's been getting it scheduled twice a day every day. Her lower extremity edema has improved since last week according to the family. They have not noticed any focal neurologic deficits. She has had poor by mouth intake for the last 24-48 hours.   It was further clarified today after speaking with the daughter and reviewing the patient's medication records that she was not taking Xanax prior to this admission .   Subjective: Patient is quite hard of hearing but is awake. According to her family she is much improved as compared to when I first saw her during the middle of the night but she is still not back to baseline. The daughter does clarify the patient's hyponatremia history and relates that the nephrologist told her that her mother had SIADH. The daughter also notes that patient presented initially with a sodium of 120 prior to treatment for SIADH and this was associated with muscular tremors and patient having difficulty feeding herself due to the tremors. The daughter reports that since the sodium has increased to greater than 130 these  tremors have not recurred.  Objective: Vital signs in last 24 hours: Temp:  [91.1 F (32.8 C)-99.2 F (37.3 C)] 98 F (36.7 C) (07/01 1400) Pulse Rate:  [41-90] 71  (07/01 1400) Resp:  [11-34] 24  (07/01 1400) BP: (90-166)/(39-101) 139/40 mmHg (07/01 1400) SpO2:  [97 %-100 %] 99 % (07/01 1400) FiO2 (%):  [28 %] 28 % (07/01 0739) Weight:  [78.7 kg (173 lb 8 oz)] 78.7 kg (173 lb 8 oz) (07/01 0825) Weight change:  Last BM Date: 09/21/11  Intake/Output from previous day:   Intake/Output this shift: Total I/O In: 693 [P.O.:90; I.V.:603] Out: -   General appearance: cooperative, appears older than stated age and mild distress Resp: clear to auscultation bilaterally but seems to be decreased in the bases, nasal cannula oxygen at 2 L saturating 99-100% Cardio: regular rate and rhythm, S1, S2 normal, no murmur, click, rub or gallop GI: soft, non-tender; bowel sounds normal; no masses,  no organomegaly Extremities: extremities normal, atraumatic, no cyanosis or edema Neurologic: Awake but oriented to name only, very hard of hearing, no appreciable focal neurological deficits  Lab Results:  Basename 09/22/11 1004 09/22/11 0548  WBC 10.9* 10.4  HGB 10.5* 10.6*  HCT 32.5* 33.0*  PLT 266 277   BMET  Basename 09/22/11 1004 09/22/11 0548 09/22/11 0442  NA -- 135 135  K -- 4.1 4.9  CL -- 94* 95*  CO2 -- 33* --  GLUCOSE -- 122* 100*  BUN -- 10 12  CREATININE 0.83 0.85 --  CALCIUM -- 8.6 --    Studies/Results: Ct  Head Wo Contrast  09/22/2011  *RADIOLOGY REPORT*  Clinical Data: Altered mental status  CT HEAD WITHOUT CONTRAST  Technique:  Contiguous axial images were obtained from the base of the skull through the vertex without contrast.  Comparison: 08/30/2011  Findings: Prominence of the sulci, cisterns, and ventricles, in keeping with volume loss. There are subcortical and periventricular white matter hypodensities, a nonspecific finding most often seen with chronic  microangiopathic changes.  There is no evidence for acute hemorrhage, overt hydrocephalus, mass lesion, or abnormal extra-axial fluid collection.  No definite CT evidence for acute cortical based (large artery) infarction. Remote left sided infarction involving the centrum semiovale, corona radiata, and anterior limb internal capsule. Atherosclerotic vascular calcification. The visualized paranasal sinuses and mastoid air cells are predominately clear.  IMPRESSION: No appreciable interval change.  White matter hypodensities, remote left basal ganglia/white matter lacunar infarction, and volume loss.  Original Report Authenticated By: Waneta Martins, M.D.   Dg Chest Portable 1 View  09/22/2011  *RADIOLOGY REPORT*  Clinical Data: Altered mental status  PORTABLE CHEST - 1 VIEW  Comparison: 09/04/2011  Findings: Hypoaeration results in interstitial and vascular crowding.  Cardiomegaly.  Lung bases are obscured by elevated hemidiaphragms.  Images further degraded by patient rotation. Interstitial prominence.  No focal consolidation visualized. No pneumothorax or large pleural effusion.  No acute osseous finding.  IMPRESSION: Degraded by hypoaeration as above.  Cardiomegaly and interstitial prominence without focal consolidation visualized.  Original Report Authenticated By: Waneta Martins, M.D.    Medications: I have reviewed the patient's current medications.  Assessment/Plan:  Principal Problem:  *SIRS (systemic inflammatory response syndrome) *Patient presented hypothermic with a rectal temperature of 91.1 as well as bradycardia with a heart rate of 48 and associated hypoglycemia-the symptoms are suggestive infection and Sirs *Treat underlying causes which at this point appears to be a severe urinary tract infection *Because of severity of presentation concern patient may have bacteremia so we have also ordered blood cultures *Amazingly lactic acid is normal and Procalcitonin is less than  0.10  Active Problems:  UTI (lower urinary tract infection) *Urine looks extremely dirty *Begin empiric Rocephin IV *Followup on urine culture *Patient has a history of recurrent UTI   Hypothermia *Suspect secondary to infectious process and evolving sepsis *Utilize warming blanket as needed to get rectal temperature at least to 98 *She is not hypotensive but given the hypothermia we will check a cortisol level as a precaution   Bradycardia suspected related to hypothermia/SIRS *Resolved with correction of hypothermia *Okay to resume AV nodal blocking agents but continued on telemetry to watch for recurrent bradycardia   Acute respiratory failure with hypoxia *Chest x-ray negative pre-hydration and only requiring low levels of oxygen supplementation   Fever *Suspect secondary to urinary tract infection   Metabolic encephalopathy *Likely due to dehydration and acute infectious process   Hypothyroidism *Continue home dose Synthroid and given bradycardia and hypothermia may need to check a TSH-was just checked on 09/05/2011 and was normal at 1.618   Diastolic CHF, chronic-grade I *Appears compensated *Given her dementia and likelihood of poor oral intake would only give Lasix based on weight gain or acute respiratory symptoms to avoid recurrent dehydration   DM II (diabetes mellitus, type II), controlled/ Hypoglycemia *Hemoglobin A1c pending *Sugars are stable and greater than 80 *Sliding scale insulin is available if indicated  SVT *Likely related to beta blocker withdrawal so we will resume beta blocker *Also possibly influenced by dehydration   Dehydration *Continue IV fluid  and hold diuretics   COPD (chronic obstructive pulmonary disease) *Compensated   Hypertension *Blood pressure moderately controlled so we'll resume Cardizem and Lopressor   Dysphagia *Daughter reports patient on special diet i.e. mechanical soft because of poor dentition *Once patient more  alert it appears to have difficulty with swallowing may need to perform formal evaluation   CAD (coronary artery disease) *Quiescent in no apparent symptoms   SIADH (syndrome of inappropriate ADH production) *Treatment initiated after patient had sodium level of 120 *Currently with utilization of the meclocycline sodium has been greater than 130 *Patient's daughter endorses that prior to raising sodium level to greater than 130 patient had difficulty with extremity tremors which have now resolved   Disposition *Remain in step down *Patient is a DO NOT RESUSCITATE     LOS: 0 days   Junious Silk, ANP pager [phone]  [facility] 1 [email or web address] Password: [email or web address]  09/22/2011, 2:40 PM

## 2011-09-22 NOTE — ED Provider Notes (Signed)
History     CSN: [id number]  Arrival date & time 09/22/11  7829   First MD Initiated Contact with Patient 09/22/11 [phone]      Chief Complaint  Patient presents with  . Altered Mental Status  . Hypoglycemia    (Consider location/radiation/quality/duration/timing/severity/associated sxs/prior treatment) HPI History provided by nursing home report and EMS. Patient unable to provide history and level V caveat applies. Desired that [facility] nursing home in [place]. EMS reports that she was found unresponsive with blood sugar in the 30s. She was also noted to be hypothermic with a core temperature of 91. Nursing home had attempted to give her oral glucose without any changes. Received IM glucagon in route by EMS. No significant changes. On arrival to emergency department patient is able to tell me her name but is unable to answer any questions or provide reliable history otherwise. Past Medical History  Diagnosis Date  . Dysphagia   . Diabetes mellitus   . Hypertension   . Hypothyroidism   . Hyperlipemia   . GERD (gastroesophageal reflux disease)   . Depression   . Anxiety   . Acute MI   . Pneumonia   . Stroke   . UTI (urinary tract infection)   . COPD (chronic obstructive pulmonary disease)   . CAD (coronary artery disease)   . Late effects of CVA (cerebrovascular accident)   . DEMENTIA   . Leukocytosis   . Anemia   . Hypokalemia   . Paralytic ileus   . Pleural effusion     Past Surgical History  Procedure Date  . Back surgery     Family History  Problem Relation Age of Onset  . Hypertension Sister   . Hypertension Brother     History  Substance Use Topics  . Smoking status: Never Smoker   . Smokeless tobacco: Never Used  . Alcohol Use: No    OB History    Grav Para Term Preterm Abortions TAB SAB Ect Mult Living                  Review of Systems Unable to obtain. Level V caveat as above. Allergies  Review of patient's allergies  indicates no known allergies.  Home Medications   Current Outpatient Rx  Name Route Sig Dispense Refill  . ACETAMINOPHEN 325 MG PO TABS Oral Take 650 mg by mouth every 4 (four) hours as needed. For pain.    . ALBUTEROL SULFATE (5 MG/ML) 0.5% IN NEBU Nebulization Take 0.5 mLs (2.5 mg total) by nebulization every 2 (two) hours as needed for wheezing or shortness of breath. 20 mL   . ASPIRIN 81 MG PO CHEW Oral Chew 81 mg by mouth daily.    . OCUVITE PO TABS Oral Take 1 tablet by mouth daily.      . DEMECLOCYCLINE HCL 150 MG PO TABS Oral Take 2 tablets (300 mg total) by mouth every 12 (twelve) hours.    Marland Kitchen DILTIAZEM HCL ER COATED BEADS 180 MG PO CP24 Oral Take 180 mg by mouth daily.    Marland Kitchen ENSURE COMPLETE PO LIQD Oral Take 237 mLs by mouth 3 (three) times daily between meals.    . FUROSEMIDE 40 MG PO TABS Oral Take 1 tablet (40 mg total) by mouth daily. 30 tablet   . HYDRALAZINE HCL 100 MG PO TABS Oral Take 1 tablet (100 mg total) by mouth every 8 (eight) hours.    . INSULIN ASPART 100 UNIT/ML [place] SOLN Subcutaneous  Inject 4 Units into the skin 3 (three) times daily with meals.     . INSULIN GLARGINE 100 UNIT/ML [place] SOLN Subcutaneous Inject 58 Units into the skin at bedtime.    . IPRATROPIUM BROMIDE 0.02 % IN SOLN Nebulization Take 0.5 mg by nebulization every 2 (two) hours as needed. For shortness of breath.    Marland Kitchen LACTULOSE 10 GM/15ML PO SOLN Oral Take 20 g by mouth 2 (two) times daily.    Marland Kitchen LEVOTHYROXINE SODIUM 100 MCG PO TABS Oral Take 100 mcg by mouth daily before breakfast.    . METOPROLOL TARTRATE 50 MG PO TABS Oral Take 50 mg by mouth 2 (two) times daily.      Marland Kitchen OMEPRAZOLE 20 MG PO CPDR Oral Take 20 mg by mouth 2 (two) times daily.      Marland Kitchen POLYETHYLENE GLYCOL 3350 PO PACK Oral Take 17 g by mouth daily.      Bernadette Hoit SODIUM 8.6-50 MG PO TABS Oral Take 1 tablet by mouth 2 (two) times daily.    Marland Kitchen SIMVASTATIN 20 MG PO TABS Oral Take 20 mg by mouth daily.        BP 144/52  Pulse 44   Temp 91.1 F (32.8 C) (Rectal)  Resp 17  SpO2 100%  Physical Exam  Constitutional:       Elderly female minimally responsive  HENT:  Head: Normocephalic and atraumatic.  Eyes: Pupils are equal, round, and reactive to light.  Neck: Neck supple.  Cardiovascular:       Bradycardic. Peripheral pulses intact and equal  Pulmonary/Chest: Effort normal and breath sounds normal. No stridor. No respiratory distress. She has no wheezes. She has no rales.  Abdominal: Soft. She exhibits no distension. There is no tenderness. There is no rebound and no guarding.  Genitourinary: Vagina normal.  Musculoskeletal: She exhibits no tenderness.       Moves all extremities x4 withdrawal from pain  Skin:       Cool to touch    ED Course  [place] bedside Date/Time: 09/22/2011 5:21 AM Performed by: Sunnie Nielsen Authorized by: Sunnie Nielsen Consent: Verbal consent obtained. Risks and benefits: risks, benefits and alternatives were discussed Consent given by: power of attorney Required items: required blood products, implants, devices, and special equipment available Patient identity confirmed: arm band Time out: Immediately prior to procedure a "time out" was called to verify the correct patient, procedure, equipment, support staff and site/side marked as required. Patient tolerance: Patient tolerated the procedure well with no immediate complications. Comments: Difficulty in obtaining blood for blood work. Right femoral stick performed to obtain 10 cc of blood using ultrasound guidance. Vasculature identified with ultrasound and needle stick performed. No arterial blood.  No significant bleeding or hematoma. Patient tolerated procedure well.   (including critical care time)  Results for orders placed during the hospital encounter of 09/22/11  GLUCOSE, CAPILLARY      Component Value Range   Glucose-Capillary 122 (*) 70 - 99 mg/dL   Comment 1 Documented in Chart     Comment 2 Repeat Test    CBC       Component Value Range   WBC 10.4  4.0 - 10.5 K/uL   RBC 3.54 (*) 3.87 - 5.11 MIL/uL   Hemoglobin 10.6 (*) 12.0 - 15.0 g/dL   HCT 14.7 (*) 82.9 - 56.2 %   MCV 93.2  78.0 - 100.0 fL   MCH 29.9  26.0 - 34.0 pg   MCHC 32.1  30.0 -  36.0 g/dL   RDW 11.9  14.7 - 82.9 %   Platelets 277  150 - 400 K/uL  COMPREHENSIVE METABOLIC PANEL      Component Value Range   Sodium 135  135 - 145 mEq/L   Potassium 4.1  3.5 - 5.1 mEq/L   Chloride 94 (*) 96 - 112 mEq/L   CO2 33 (*) 19 - 32 mEq/L   Glucose, Bld 122 (*) 70 - 99 mg/dL   BUN 10  6 - 23 mg/dL   Creatinine, Ser 5.62  0.50 - 1.10 mg/dL   Calcium 8.6  8.4 - 13.0 mg/dL   Total Protein 5.7 (*) 6.0 - 8.3 g/dL   Albumin 2.5 (*) 3.5 - 5.2 g/dL   AST 21  0 - 37 U/L   ALT 22  0 - 35 U/L   Alkaline Phosphatase 99  39 - 117 U/L   Total Bilirubin 0.2 (*) 0.3 - 1.2 mg/dL   GFR calc non Af Amer 59 (*) >90 mL/min   GFR calc Af Amer 68 (*) >90 mL/min  LACTIC ACID, PLASMA      Component Value Range   Lactic Acid, Venous 0.7  0.5 - 2.2 mmol/L  CK TOTAL AND CKMB      Component Value Range   Total CK 44  7 - 177 U/L   CK, MB 2.2  0.3 - 4.0 ng/mL   Relative Index RELATIVE INDEX IS INVALID  0.0 - 2.5  POCT I-STAT, CHEM 8      Component Value Range   Sodium 135  135 - 145 mEq/L   Potassium 4.9  3.5 - 5.1 mEq/L   Chloride 95 (*) 96 - 112 mEq/L   BUN 12  6 - 23 mg/dL   Creatinine, Ser 8.65  0.50 - 1.10 mg/dL   Glucose, Bld 784 (*) 70 - 99 mg/dL   Calcium, Ion 6.96  2.95 - 1.32 mmol/L   TCO2 33  0 - 100 mmol/L   Hemoglobin 12.2  12.0 - 15.0 g/dL   HCT 28.4  13.2 - 44.0 %  POCT I-STAT TROPONIN I      Component Value Range   Troponin i, poc 0.01  0.00 - 0.08 ng/mL   Comment 3           GLUCOSE, CAPILLARY      Component Value Range   Glucose-Capillary 116 (*) 70 - 99 mg/dL   Dg Chest 2 View  03/26/7251  *RADIOLOGY REPORT*  Clinical Data: Recurrent hyponatremia.  Rule out aspiration pneumonia versus malignancy.  CHEST - 2 VIEW  Comparison: 08/30/2011   Findings: Lateral view degraded by patient arm position.  Mild hyperinflation on the lateral view.  Low volumes on the frontal. Cardiomegaly accentuated by AP portable technique.  Small bilateral pleural effusions.  The right effusion is likely similar. The left effusion is new or slightly increased. No pneumothorax. Mild bibasilar airspace disease is slightly increased.  IMPRESSION: Cardiomegaly with small bilateral pleural effusions and bibasilar atelectasis.  No evidence of pneumonia or primary malignancy within the chest.  Original Report Authenticated By: Consuello Bossier, M.D.   Ct Head Wo Contrast  09/22/2011  *RADIOLOGY REPORT*  Clinical Data: Altered mental status  CT HEAD WITHOUT CONTRAST  Technique:  Contiguous axial images were obtained from the base of the skull through the vertex without contrast.  Comparison: 08/30/2011  Findings: Prominence of the sulci, cisterns, and ventricles, in keeping with volume loss. There are subcortical and periventricular white matter  hypodensities, a nonspecific finding most often seen with chronic microangiopathic changes.  There is no evidence for acute hemorrhage, overt hydrocephalus, mass lesion, or abnormal extra-axial fluid collection.  No definite CT evidence for acute cortical based (large artery) infarction. Remote left sided infarction involving the centrum semiovale, corona radiata, and anterior limb internal capsule. Atherosclerotic vascular calcification. The visualized paranasal sinuses and mastoid air cells are predominately clear.  IMPRESSION: No appreciable interval change.  White matter hypodensities, remote left basal ganglia/white matter lacunar infarction, and volume loss.  Original Report Authenticated By: Waneta Martins, M.D.   Ct Head Wo Contrast  08/30/2011  *RADIOLOGY REPORT*  Clinical Data: Altered mental status  CT HEAD WITHOUT CONTRAST  Technique:  Contiguous axial images were obtained from the base of the skull through the vertex  without contrast.  Comparison: CT 08/15/2011 and 02/02/2011  Findings: Encephalomalacia in the left basal ganglia, extending into the left centrum semiovale  is compatible with prior infarction stable in appearance.  The ventricles are within normal limits.  Negative for hydrocephalus, hemorrhage, mass effect, mass lesion, or evidence of acute infarction. Heavy intracranial atherosclerotic calcification noted.  The visualized paranasal sinuses, mastoid air cells, and middle ears are clear.  The skull is intact.  Soft tissues of the scalp are symmetric.  IMPRESSION:  1.  No acute intracranial abnormality. 2.  Remote infarction in the left basal ganglia and left centrum semiovale.  Original Report Authenticated By: Britta Mccreedy, M.D.   Dg Chest Portable 1 View  09/22/2011  *RADIOLOGY REPORT*  Clinical Data: Altered mental status  PORTABLE CHEST - 1 VIEW  Comparison: 09/04/2011  Findings: Hypoaeration results in interstitial and vascular crowding.  Cardiomegaly.  Lung bases are obscured by elevated hemidiaphragms.  Images further degraded by patient rotation. Interstitial prominence.  No focal consolidation visualized. No pneumothorax or large pleural effusion.  No acute osseous finding.  IMPRESSION: Degraded by hypoaeration as above.  Cardiomegaly and interstitial prominence without focal consolidation visualized.  Original Report Authenticated By: Waneta Martins, M.D.   Dg Chest Port 1 View  08/30/2011  *RADIOLOGY REPORT*  Clinical Data: Altered mental status, wheezing  PORTABLE CHEST - 1 VIEW  Comparison: 08/18/2011  Findings: Cardiomediastinal silhouette is stable.  Mild interstitial prominence bilaterally without convincing pulmonary edema.  No focal infiltrate.  IMPRESSION: Mild interstitial prominence bilaterally without convincing pulmonary edema.  No focal infiltrate.  Original Report Authenticated By: Natasha Mead, M.D.      Date: 09/22/2011  Rate: 44  Rhythm: sinus bradycardia  QRS Axis: normal   Intervals: normal  ST/T Wave abnormalities: nonspecific ST/T changes  Conduction Disutrbances:nonspecific intraventricular conduction delay  Narrative Interpretation: : QTc 493  Old EKG Reviewed: changes noted  CRITICAL CARE Performed by: Lynda Wanninger   Total critical care time: 45  Critical care time was exclusive of separately billable procedures and treating other patients.  Critical care was necessary to treat or prevent imminent or life-threatening deterioration.  Critical care was time spent personally by me on the following activities: development of treatment plan with patient and/or surrogate as well as nursing, discussions with consultants, evaluation of patient's response to treatment, examination of patient, obtaining history from patient or surrogate, ordering and performing treatments and interventions, ordering and review of laboratory studies, ordering and review of radiographic studies, pulse oximetry and re-evaluation of patient's condition. Aggressive warming measures with IV fluids and warming blanket. Serial blood sugars obtained. Family present at bedside and updated on condition with serial evaluations. Labs and imaging obtained and  reviewed as above. No significant changes in the ED. Medicine consult for admission and further evaluation. IV antibiotics for UTI. Urine culture pending    MDM   Hypoglycemia and hypothermia and nursing home patient with DO NOT RESUSCITATE paperwork and DO NOT RESUSCITATE confirmed by family bedside. IV antibiotics for UTI.       Sunnie Nielsen, MD 09/22/11 [phone]

## 2011-09-22 NOTE — ED Notes (Signed)
Per EMS:  Pt comes from [facility]  [place], [place]) Nurse reports making attempts to to increase blood glucose levels from the 30's - pt received glucagon IM and oral glucose but pt's glucose continued to decline.

## 2011-09-22 NOTE — H&P (Signed)
[facility] [place] TEAM 1 - Stepdown/ICU TEAM  PCP:  Colon Branch, MD  HPI: 78 year old female with a history of previous CVA, insulin-dependent diabetes, dysphagia, dementia with bed bound status at baseline who was transported to the emergency department after she was found to be unresponsive with blood sugar in the 30s. She was also noted to be hypothermic with a core temperature of 91.  Subjective: Patient is quite hard of hearing but is awake. According to her family she is much improved. The daughter does clarify the patient's hyponatremia history and relates that the nephrologist told her that her mother had SIADH. The daughter also notes that patient presented initially with a sodium of 120 prior to treatment for SIADH and this was associated with muscular tremors and patient having difficulty feeding herself due to the tremors. The daughter reports that since the sodium has increased to greater than 130 these tremors have not recurred.  ROS:  Not able to be accomplished as the patient remains quite confused.  Outpatient Meds:  Her SNF med list has been reviewed in detail  Allergy:  NKDA    Past Med Hx/Soc Hx:  both reviewed in detail from H&P composed on 08/30/2011  Objective: Blood pressure 139/40, pulse 71, temperature 98 F (36.7 C), temperature source Oral, resp. rate 24, height 5\' 9"  (1.753 m), weight 78.7 kg (173 lb 8 oz), SpO2 99.00%.   Intake/Output this shift: Total I/O In: 693 [P.O.:90; I.V.:603] Out: -   General appearance: cooperative, appears older than stated age and mild distress Resp: clear to auscultation bilaterally but seems to be decreased in the bases, nasal cannula oxygen at 2 L saturating 99-100% Cardio: regular rate and rhythm, S1, S2 normal, no murmur, click, rub or gallop GI: soft, non-tender; bowel sounds normal; no masses,  no organomegaly Extremities: extremities normal, atraumatic, no cyanosis or edema Neurologic: Awake but oriented  to name only, very hard of hearing, no appreciable focal neurological deficits  Lab Results:  Basename 09/22/11 1004 09/22/11 0548  WBC 10.9* 10.4  HGB 10.5* 10.6*  HCT 32.5* 33.0*  PLT 266 277   BMET  Basename 09/22/11 1004 09/22/11 0548 09/22/11 0442  NA -- 135 135  K -- 4.1 4.9  CL -- 94* 95*  CO2 -- 33* --  GLUCOSE -- 122* 100*  BUN -- 10 12  CREATININE 0.83 0.85 --  CALCIUM -- 8.6 --    Studies/Results: Ct Head Wo Contrast  09/22/2011  *RADIOLOGY REPORT*  Clinical Data: Altered mental status  CT HEAD WITHOUT CONTRAST  Technique:  Contiguous axial images were obtained from the base of the skull through the vertex without contrast.  Comparison: 08/30/2011  Findings: Prominence of the sulci, cisterns, and ventricles, in keeping with volume loss. There are subcortical and periventricular white matter hypodensities, a nonspecific finding most often seen with chronic microangiopathic changes.  There is no evidence for acute hemorrhage, overt hydrocephalus, mass lesion, or abnormal extra-axial fluid collection.  No definite CT evidence for acute cortical based (large artery) infarction. Remote left sided infarction involving the centrum semiovale, corona radiata, and anterior limb internal capsule. Atherosclerotic vascular calcification. The visualized paranasal sinuses and mastoid air cells are predominately clear.  IMPRESSION: No appreciable interval change.  White matter hypodensities, remote left basal ganglia/white matter lacunar infarction, and volume loss.  Original Report Authenticated By: Waneta Martins, M.D.   Dg Chest Portable 1 View  09/22/2011  *RADIOLOGY REPORT*  Clinical Data: Altered mental status  PORTABLE CHEST -  1 VIEW  Comparison: 09/04/2011  Findings: Hypoaeration results in interstitial and vascular crowding.  Cardiomegaly.  Lung bases are obscured by elevated hemidiaphragms.  Images further degraded by patient rotation. Interstitial prominence.  No focal  consolidation visualized. No pneumothorax or large pleural effusion.  No acute osseous finding.  IMPRESSION: Degraded by hypoaeration as above.  Cardiomegaly and interstitial prominence without focal consolidation visualized.  Original Report Authenticated By: Waneta Martins, M.D.   Assessment/Plan:  SIRS (systemic inflammatory response syndrome) *Patient presented hypothermic with a rectal temperature of 91.1 as well as bradycardia with a heart rate of 48 and associated hypoglycemia-the symptoms are suggestive of infection/SIRS *Treat underlying causes which at this point appears to be a severe urinary tract infection *Because of severity of presentation concern patient may have bacteremia so we have also ordered blood cultures *Amazingly lactic acid is normal and Procalcitonin is less than 0.10, so severe systemic infection less likely   UTI (lower urinary tract infection)/Pyelonephritis *Urine looks extremely dirty *Begin empiric Rocephin IV *Followup on urine culture *Patient has a history of recurrent UTI - will need to consider adding Vanc if sx do not improve  Hypothermia *Suspect secondary to infectious process and evolving sepsis *Utilize warming blanket as needed to get rectal temperature at least to 98 *She is not hypotensive but given the hypothermia we will check a cortisol level as a precaution  Bradycardia suspected related to hypothermia/SIRS *Resolved with correction of hypothermia *Okay to resume AV nodal blocking agents but continued on telemetry to watch for recurrent bradycardia  Metabolic encephalopathy *Likely due to dehydration and acute infectious process  Hypothyroidism *Continue home dose Synthroid - TSH was just checked on 09/05/2011 and was normal at 1.618  Diastolic CHF, chronic-grade I *Appears compensated *Given her dementia and likelihood of poor oral intake would only give Lasix based on weight gain or acute respiratory symptoms to avoid  recurrent dehydration  DM II, controlled/ Hypoglycemia *Hemoglobin A1c pending *Sugars are stable and greater than 80 *Sliding scale insulin is available if indicated  SVT *Likely related to beta blocker withdrawal so we will resume beta blocker *Also possibly influenced by dehydration  Dehydration *Continue IV fluid and hold diuretics  COPD (chronic obstructive pulmonary disease) *Compensated  Hypertension *Blood pressure moderately controlled so we'll resume Cardizem and Lopressor  Dysphagia *Daughter reports patient on special diet i.e. mechanical soft because of poor dentition *Once patient more alert if she appears to have difficulty with swallowing may need to perform formal evaluation  CAD (coronary artery disease) *Quiescent/no apparent symptoms  SIADH (syndrome of inappropriate ADH production) *Treatment initiated after patient had sodium level of 120 *Currently with utilization of the demeclocycline sodium has been greater than 130 *Patient's daughter endorses that prior to raising sodium level to greater than 130 patient had difficulty with extremity tremors which have now resolved  Disposition *Remain in step down *Patient is a DO NOT RESUSCITATE   LOS: 0 days   Junious Silk, ANP pager [phone]  [facility] [email or web address] Password: [email or web address]  09/22/2011, 2:40 PM  I have personally examined this patient and reviewed the entire database. I have reviewed the above note, made any necessary editorial changes, and agree with its content.  Lonia Blood, MD [facility]

## 2011-09-23 LAB — COMPREHENSIVE METABOLIC PANEL
ALT: 19 U/L (ref 0–35)
AST: 16 U/L (ref 0–37)
Calcium: 8.2 mg/dL — ABNORMAL LOW (ref 8.4–10.5)
GFR calc Af Amer: 71 mL/min — ABNORMAL LOW (ref >90)
Glucose, Bld: 155 mg/dL — ABNORMAL HIGH (ref 70–99)
Sodium: 135 mEq/L (ref 135–145)
Total Protein: 5.1 g/dL — ABNORMAL LOW (ref 6.0–8.3)

## 2011-09-23 LAB — GLUCOSE, CAPILLARY
Glucose-Capillary: 141 mg/dL — ABNORMAL HIGH (ref 70–99)
Glucose-Capillary: 146 mg/dL — ABNORMAL HIGH (ref 70–99)
Glucose-Capillary: 205 mg/dL — ABNORMAL HIGH (ref 70–99)

## 2011-09-23 LAB — CBC
HCT: 28.5 % — ABNORMAL LOW (ref 36.0–46.0)
Hemoglobin: 9.1 g/dL — ABNORMAL LOW (ref 12.0–15.0)
MCHC: 31.9 g/dL (ref 30.0–36.0)

## 2011-09-23 MED ORDER — HYDRALAZINE HCL 50 MG PO TABS
100.0000 mg | ORAL_TABLET | Freq: Three times a day (TID) | ORAL | Status: DC
  Administered 2011-09-23 – 2011-09-30 (×18): 100 mg via ORAL
  Filled 2011-09-23 (×26): qty 2

## 2011-09-23 NOTE — Progress Notes (Signed)
Attempted to call [address] several time. Secretary stated very busy with new admissions.

## 2011-09-23 NOTE — Progress Notes (Signed)
INITIAL ADULT NUTRITION ASSESSMENT Date: 09/23/2011   Time: 10:33 AM  Reason for Assessment: Nutrition Risk Report (Dependent Feeder)  ASSESSMENT: Female 78 y.o.  Dx: SIRS (systemic inflammatory response syndrome)  Hx:  Past Medical History  Diagnosis Date  . Dysphagia   . Diabetes mellitus   . Hypertension   . Hypothyroidism   . Hyperlipemia   . GERD (gastroesophageal reflux disease)   . Depression   . Anxiety   . Acute MI   . Pneumonia   . Stroke   . UTI (urinary tract infection)   . COPD (chronic obstructive pulmonary disease)   . CAD (coronary artery disease)   . Late effects of CVA (cerebrovascular accident)   . DEMENTIA   . Leukocytosis   . Anemia   . Hypokalemia   . Paralytic ileus   . Pleural effusion    Past Surgical History  Procedure Date  . Back surgery    Related Meds:     . antiseptic oral rinse  15 mL Mouth Rinse BID  . aspirin  81 mg Oral Daily  . beta carotene w/minerals  1 tablet Oral Daily  . cefTRIAXone (ROCEPHIN)  IV  1 g Intravenous Q24H  . demeclocycline  300 mg Oral Q12H  . diltiazem  180 mg Oral Daily  . enoxaparin  40 mg Subcutaneous Q24H  . feeding supplement  237 mL Oral TID BM  . insulin aspart  0-9 Units Subcutaneous TID WC  . levothyroxine  100 mcg Oral QAC breakfast  . metoprolol  5 mg Intravenous Once  . metoprolol  50 mg Oral BID  . pantoprazole  40 mg Oral Q1200  . sodium chloride  3 mL Intravenous Q12H  . DISCONTD: insulin aspart  0-9 Units Subcutaneous Q4H   Ht: 5\' 9"  (175.3 cm)  Wt: 188 lb 0.8 oz (85.3 kg)  Ideal Wt: 65.9 kg % Ideal Wt: 129%  Wt Readings from Last 15 Encounters:  09/23/11 188 lb 0.8 oz (85.3 kg)  09/07/11 176 lb 5.9 oz (80 kg)  08/21/11 177 lb 0.5 oz (80.3 kg)  06/22/11 189 lb (85.73 kg)  Usual Wt: 177 lb % Usual Wt: 106%  Body mass index is 27.77 kg/(m^2). Pt is overweight.  Food/Nutrition Related Hx:   Labs:  CMP     Component Value Date/Time   NA 135 09/23/2011 0355   K 4.3  09/23/2011 0355   CL 98 09/23/2011 0355   CO2 29 09/23/2011 0355   GLUCOSE 155* 09/23/2011 0355   BUN 9 09/23/2011 0355   CREATININE 0.82 09/23/2011 0355   CALCIUM 8.2* 09/23/2011 0355   PROT 5.1* 09/23/2011 0355   ALBUMIN 2.3* 09/23/2011 0355   AST 16 09/23/2011 0355   ALT 19 09/23/2011 0355   ALKPHOS 85 09/23/2011 0355   BILITOT 0.2* 09/23/2011 0355   GFRNONAA 61* 09/23/2011 0355   GFRAA 71* 09/23/2011 0355   CBG (last 3)   Basename 09/23/11 0738 09/22/11 2052 09/22/11 1655  GLUCAP 141* 175* 127*    Lab Results  Component Value Date   HGBA1C 7.0* 09/22/2011    Intake/Output Summary (Last 24 hours) at 09/23/11 1035 Last data filed at 09/23/11 0900  Gross per 24 hour  Intake   2000 ml  Output    650 ml  Net   1350 ml  BM on 6/30  Diet Order: Carb Control Medium (1600 - 2000)  Supplements/Tube Feeding: Ensure Complete PO BID  IVF:    DISCONTD: sodium chloride Last Rate:  75 mL/hr at 09/22/11 1851    Estimated Nutritional Needs:   Kcal: 1400 - 1600 kcal Protein:  68 - 85 grams Fluid: at least 2 liters daily  PMHx significant for CVA, IDDM, dysphagia, and dementia. Bed bound at baseline. Admitted 2/2 hypothermia and hypoglycemia.  Pt has been seen by RD staff in the past during previous hospitalizations 2/2 problems chewing/swallowing. Pt has been placed on dysphagia diets in the past for these issues.  NUTRITION DIAGNOSIS: -Biting/Chewing (NI-1.2).  Status: Ongoing  RELATED TO: edentulous  AS EVIDENCE BY: need for dysphagia diet at baseline  MONITORING/EVALUATION(Goals): Goal: Pt to meet >/= 90% of their estimated nutrition needs; not met Monitor: weights, labs, PO intake, I/Os  EDUCATION NEEDS: -Education not appropriate at this time  INTERVENTION: 1. Downgrade diet to Mechanical Soft (Dysphagia 3) with CHO restrictions 2. Continue Ensure Supplementation 3. RD to continue to follow nutrition care plan  Dietitian #: [phone]  DOCUMENTATION CODES Per approved criteria  -Not  Applicable   Jarold Motto MS, RD, LDN Pager: [phone] After-hours pager: [phone]

## 2011-09-23 NOTE — Progress Notes (Signed)
Tried to call report x1 

## 2011-09-23 NOTE — Progress Notes (Addendum)
Called report to [place], [facility] [employer]. Pt to travel via wheelchair to room 22, no IV, no tele, O2-2L. Family and belongings at bedside. Meds in chart. No current questions or complaints. Raimundo Corbit L

## 2011-09-23 NOTE — Progress Notes (Signed)
Received patient from 2600, patient is alert and oriented X2, pleasant, not in any distress, O2 at 2LPM, kept comfortably in bed, bed alarm activated, oriented to unit. VSS. Will continue to monitor.

## 2011-09-23 NOTE — Progress Notes (Signed)
Clinical Social Worker submitted appropriate information to [employer] for their review.   Angelia Mould, MSW, [place] [phone]

## 2011-09-23 NOTE — Progress Notes (Signed)
[facility]  Interim history: 78 year old female with a history of previous CVA, insulin-dependent diabetes, dysphagia, dementia with bed bound status at baseline who was transported to the emergency department after she was found to be unresponsive with blood sugar in the 30s. She was also noted to be hypothermic with a core temperature of 91.  Subjective: Today she is more alert and has eaten breakfast without difficulty. She denies chest pain or shortness of breath. Her son is at the bedside and is assisting with our history and examination  Objective: Vital signs in last 24 hours: Temp:  [97.6 F (36.4 C)-98.5 F (36.9 C)] 97.7 F (36.5 C) (07/02 1234) Pulse Rate:  [62-74] 64  (07/02 1300) Resp:  [16-30] 30  (07/02 1300) BP: (129-170)/(44-58) 158/50 mmHg (07/02 1300) SpO2:  [99 %-100 %] 100 % (07/02 1300) FiO2 (%):  [2 %] 2 % (07/01 1600) Weight:  [85.3 kg (188 lb 0.8 oz)] 85.3 kg (188 lb 0.8 oz) (07/02 0000) Weight change:  Last BM Date: 09/21/11  Intake/Output from previous day: 07/01 0701 - 07/02 0700 In: 2053 [P.O.:150; I.V.:1853; IV Piggyback:50] Out: 500 [Urine:500] Intake/Output this shift: Total I/O In: 150 [I.V.:150] Out: 550 [Urine:550]  General appearance: alert, cooperative, appears stated age and no distress Resp: clear to auscultation bilaterally decreased in the bases and on nasal cannula oxygen Cardio: regular rate and rhythm, S1, S2 normal, no murmur, click, rub or gallop,no cyanosis but she is developing soft nonpitting dependent edema of the hands and feet  Abdomen: Soft nondistended and nontender and tolerating solid Musculoskeletal: extremities normal, atraumatic Skin: Skin color, texture, turgor normal. No rashes or lesions Neurologic: Grossly normal for this patient-hard of hearing with a short-term memory deficits but no focal neurological deficits and according to her family this is her baseline.  Lab Results:  Basename 09/23/11 0355  09/22/11 1004  WBC 7.6 10.9*  HGB 9.1* 10.5*  HCT 28.5* 32.5*  PLT 237 266   BMET  Basename 09/23/11 0355 09/22/11 1004 09/22/11 0548  NA 135 -- 135  K 4.3 -- 4.1  CL 98 -- 94*  CO2 29 -- 33*  GLUCOSE 155* -- 122*  BUN 9 -- 10  CREATININE 0.82 0.83 --  CALCIUM 8.2* -- 8.6    Studies/Results: Ct Head Wo Contrast  09/22/2011  *RADIOLOGY REPORT*  Clinical Data: Altered mental status  CT HEAD WITHOUT CONTRAST  Technique:  Contiguous axial images were obtained from the base of the skull through the vertex without contrast.  Comparison: 08/30/2011  Findings: Prominence of the sulci, cisterns, and ventricles, in keeping with volume loss. There are subcortical and periventricular white matter hypodensities, a nonspecific finding most often seen with chronic microangiopathic changes.  There is no evidence for acute hemorrhage, overt hydrocephalus, mass lesion, or abnormal extra-axial fluid collection.  No definite CT evidence for acute cortical based (large artery) infarction. Remote left sided infarction involving the centrum semiovale, corona radiata, and anterior limb internal capsule. Atherosclerotic vascular calcification. The visualized paranasal sinuses and mastoid air cells are predominately clear.  IMPRESSION: No appreciable interval change.  White matter hypodensities, remote left basal ganglia/white matter lacunar infarction, and volume loss.  Original Report Authenticated By: Waneta Martins, M.D.   Dg Chest Portable 1 View  09/22/2011  *RADIOLOGY REPORT*  Clinical Data: Altered mental status  PORTABLE CHEST - 1 VIEW  Comparison: 09/04/2011  Findings: Hypoaeration results in interstitial and vascular crowding.  Cardiomegaly.  Lung bases are obscured by elevated hemidiaphragms.  Images further  degraded by patient rotation. Interstitial prominence.  No focal consolidation visualized. No pneumothorax or large pleural effusion.  No acute osseous finding.  IMPRESSION: Degraded by hypoaeration  as above.  Cardiomegaly and interstitial prominence without focal consolidation visualized.  Original Report Authenticated By: Waneta Martins, M.D.    Medications: I have reviewed the patient's current medications.  Assessment/Plan:  SIRS (systemic inflammatory response syndrome)  RESOLVING *Patient presented hypothermic with a rectal temperature of 91.1 as well as bradycardia with a heart rate of 48 and associated hypoglycemia-the symptoms are suggestive of infection/SIRS  *Treat underlying causes which at this point appears to be a severe urinary tract infection  *Because of severity of presentation concern patient may have bacteremia so we have also ordered blood cultures and results are currently pending *Amazingly lactic acid was normal and Procalcitonin was less than 0.10, so severe systemic infection less likely   E. coli UTI (lower urinary tract infection)/Pyelonephritis  *Urine looked extremely dirty and initial cultures demonstrate greater than 100,000 colonies of Escherichia coli with sensitivities pending *cont empiric Rocephin IV  *Patient has a history of recurrent UTI - will need to consider adding Vanc if sx do not improve   Hypothermia  RESOLVED *Has been normothermic for greater than 24 hours *Suspect secondary to infectious process and evolving sepsis  *This patient did require a warming blanket to get rectal temperature at least to 98  *She was not hypotensive but given the hypothermia we opted to check a serum cortisol level which was normal at 15   Bradycardia suspected related to hypothermia/SIRS  *Resolved with correction of hypothermia  *AV nodal blocking agents were resumed on 07/01/2013without any recurrence of bradycardia  Metabolic encephalopathy  RESOLVED *Likely due to dehydration and acute infectious process   Hypothyroidism  *Continue home dose Synthroid - TSH was just checked on 09/05/2011 and was normal at 1.618   Diastolic CHF,  chronic-grade I  *Appears compensated but as a precaution prior maintenance fluids were discontinued today *Given her dementia and likelihood of poor oral intake would only give Lasix based on weight gain or acute respiratory symptoms to avoid recurrent dehydration  *Begin daily weight  DM II, controlled/ Hypoglycemia  *Hemoglobin A1c 7.0 *Sugars are stable and greater than 80  *Sliding scale insulin is available if indicated   SVT  RESOLVED *Likely related to beta blocker withdrawal so we will resume beta blocker  *Also possibly influenced by dehydration   Dehydration  RESOLVED *Is now euvolemic so IV fluids have been discontinued *Continue to hold diuretics for now  COPD (chronic obstructive pulmonary disease)  *Compensated   Hypertension  *Blood pressure remains elevated so hydralazine at a today *Continue Cardizem and Lopressor   Dysphagia  *Daughter reports patient on special diet i.e. mechanical soft because of poor dentition  *Once patient more alert if she appears to have difficulty with swallowing may need to perform formal evaluation   CAD (coronary artery disease)  *Quiescent/no apparent symptoms   SIADH (syndrome of inappropriate ADH production)  *Treatment initiated after patient had sodium level of 120  *Currently with utilization of the demeclocycline sodium has been greater than 130  *Patient's daughter endorses that prior to raising sodium level to greater than 130 patient had difficulty with extremity tremors which have now resolved   Disposition  *Transfer to floor *Patient is a DO NOT RESUSCITATE    LOS: 1 day   Junious Silk, ANP pager [phone]  [facility] [email or web address] Password: [email or web address]  09/23/2011, 2:15  PM  I have examined the patient, modified the above note and agree with it.   Calvert Cantor, MD  [phone]

## 2011-09-23 NOTE — Evaluation (Signed)
Physical Therapy Evaluation Patient Details Name: MAVI UN MRN: [id number] DOB: Nov 15, 1918 Today's Date: 09/23/2011 Time: 4098-1191 PT Time Calculation (min): 17 min  PT Assessment / Plan / Recommendation Clinical Impression  Pt admitted with sepsis, hypoglycemia and hypothermia from SNF where she has resided for 5 years. Pt with generalized weakness from repeated hospitalizations and will benefit from acute therapy to maximize strength and transfers to return pt to baseline fuction and decrease burden of care.     PT Assessment  Patient needs continued PT services    Follow Up Recommendations  Skilled nursing facility    Barriers to Discharge None      Equipment Recommendations  Defer to next venue    Recommendations for Other Services     Frequency Min 2X/week    Precautions / Restrictions Precautions Precautions: Fall   Pertinent Vitals/Pain No pain Vitals stable      Mobility  Bed Mobility Bed Mobility: Supine to Sit;Sitting - Scoot to Edge of Bed Supine to Sit: 3: Mod assist;HOB elevated;With rails (HOB 20degrees) Sitting - Scoot to Edge of Bed: 3: Mod assist Details for Bed Mobility Assistance: cueing for sequence for partial roll to right with rail and assist for semisidely to sit with assist to move bil LE off EOB and elevate trunk. To scoot to EOB mod assist with pad but able to scoot back in chair supervision with cueing Transfers Transfers: Sit to Stand;Stand to Sit;Stand Pivot Transfers Sit to Stand: 1: +2 Total assist;From bed Sit to Stand: Patient Percentage: 50% Stand to Sit: 1: +2 Total assist;To chair/3-in-1 Stand to Sit: Patient Percentage: 50% Stand Pivot Transfers: 1: +2 Total assist Stand Pivot Transfers: Patient Percentage: 50% Details for Transfer Assistance: max cueing verbally and tactile to place hands on surface and push into surface with repeated instruction before pt able to perform. assist for anterior translation. Once in standing  able to hold bil hands and pivot feet to chair in relatively upright posture with cueing for sequence Ambulation/Gait Ambulation/Gait Assistance: Not tested (comment)    Exercises General Exercises - Lower Extremity Short Arc Quad: AROM;Both;15 reps;Seated Hip Flexion/Marching: AROM;Both;15 reps;Seated   PT Diagnosis: Generalized weakness  PT Problem List: Decreased strength;Decreased activity tolerance;Decreased safety awareness;Decreased balance;Decreased mobility PT Treatment Interventions: Functional mobility training;Therapeutic activities;Therapeutic exercise;Patient/family education   PT Goals Acute Rehab PT Goals PT Goal Formulation: With patient/family Time For Goal Achievement: 10/07/11 Potential to Achieve Goals: Fair Pt will go Supine/Side to Sit: with min assist;with HOB 0 degrees PT Goal: Supine/Side to Sit - Progress: Goal set today Pt will go Sit to Supine/Side: with min assist;with HOB 0 degrees PT Goal: Sit to Supine/Side - Progress: Goal set today Pt will go Sit to Stand: with min assist PT Goal: Sit to Stand - Progress: Goal set today Pt will go Stand to Sit: with min assist PT Goal: Stand to Sit - Progress: Goal set today Pt will Transfer Bed to Chair/Chair to Bed: with min assist PT Transfer Goal: Bed to Chair/Chair to Bed - Progress: Goal set today PT Goal: Ambulate - Progress: Discontinued (comment)  Visit Information  Last PT Received On: 09/23/11 Assistance Needed: +2    Subjective Data  Subjective: Rhunette Croft Patient Stated Goal: family would love to see her transfer with min assist   Prior Functioning  Home Living Lives With: Other (Comment) Available Help at Discharge: Skilled Nursing Facility Type of Home: Skilled Nursing Facility Home Adaptive Equipment: Wheelchair - manual;Walker - rolling Additional Comments: Pt lives at  [facility] x 5 years and declined from walking to just using a wheelchair because it was easier. Pt has had therapy off  and on through the years but was receiving it since last hospital admission. Prior Function Level of Independence: Needs assistance Needs Assistance: Bathing;Dressing;Toileting;Meal Prep;Light Housekeeping;Transfers Bath: Maximal Dressing: Maximal Toileting: Maximal Meal Prep: Total Light Housekeeping: Total Transfer Assistance: Per staff at SNF was one person assist to get OOB and stand pivot to a WC Able to Take Stairs?: No Driving: No Communication Communication: HOH    Cognition  Overall Cognitive Status: History of cognitive impairments - at baseline Arousal/Alertness: Awake/alert Orientation Level: Oriented X4 / Intact Behavior During Session: [facility] for tasks performed    Extremity/Trunk Assessment Right Lower Extremity Assessment RLE ROM/Strength/Tone: [facility] for tasks assessed RLE ROM/Strength/Tone Deficits: did not attempt resistance but able to move 3/5 with hip and knee flexion bilaterally Left Lower Extremity Assessment LLE ROM/Strength/Tone: WFL for tasks assessed LLE ROM/Strength/Tone Deficits: did not attempt resistance but able to move 3/5 with hip and knee flexion bilaterally   Balance Static Sitting Balance Static Sitting - Balance Support: Feet supported;Feet unsupported Static Sitting - Level of Assistance: 5: Stand by assistance Static Sitting - Comment/# of Minutes: 4  End of Session PT - End of Session Equipment Utilized During Treatment: Gait belt Activity Tolerance: Patient tolerated treatment well Patient left: in chair;with call bell/phone within reach;with family/visitor present Nurse Communication: Mobility status  GP     Delorse Lek 09/23/2011, 3:43 PM  Delaney Meigs, PT [phone]

## 2011-09-24 LAB — GLUCOSE, CAPILLARY
Glucose-Capillary: 171 mg/dL — ABNORMAL HIGH (ref 70–99)
Glucose-Capillary: 183 mg/dL — ABNORMAL HIGH (ref 70–99)

## 2011-09-24 LAB — URINE CULTURE: Colony Count: >=100000

## 2011-09-24 MED ORDER — INSULIN GLARGINE 100 UNIT/ML ~~LOC~~ SOLN
4.0000 [IU] | Freq: Every day | SUBCUTANEOUS | Status: DC
  Administered 2011-09-24 – 2011-09-25 (×2): 4 [IU] via SUBCUTANEOUS

## 2011-09-24 MED ORDER — SODIUM CHLORIDE 0.9 % IV SOLN
500.0000 mg | Freq: Three times a day (TID) | INTRAVENOUS | Status: DC
  Administered 2011-09-24 – 2011-09-30 (×18): 500 mg via INTRAVENOUS
  Filled 2011-09-24 (×26): qty 500

## 2011-09-24 NOTE — Progress Notes (Signed)
ANTIBIOTIC CONSULT NOTE - INITIAL  Pharmacy Consult for Imipenem Indication: UTI - E coli, ESBL; also covering for pyelonephritis & sepsis  No Known Allergies  Patient Measurements: Height: 5\' 9"  (175.3 cm) Weight: 183 lb 8 oz (83.235 kg) IBW/kg (Calculated) : 66.2   Vital Signs: Temp: 98.6 F (37 C) (07/03 1358) Temp src: Oral (07/03 1358) BP: 132/46 mmHg (07/03 1358) Pulse Rate: 67  (07/03 1358) Intake/Output from previous day: 07/02 0701 - 07/03 0700 In: 150 [I.V.:150] Out: 1650 [Urine:1650]   Labs:  Basename 09/23/11 0355 09/22/11 1004 09/22/11 0548  WBC 7.6 10.9* 10.4  HGB 9.1* 10.5* 10.6*  PLT 237 266 277  LABCREA -- -- --  CREATININE 0.82 0.83 0.85   Estimated Creatinine Clearance: 52.5 ml/min (by C-G formula based on Cr of 0.82).  Microbiology: Recent Results (from the past 720 hour(s))  MRSA PCR SCREENING     Status: Normal   Collection Time   08/30/11 11:34 PM      Component Value Range Status Comment   MRSA by PCR NEGATIVE  NEGATIVE Final   URINE CULTURE     Status: Normal   Collection Time   09/04/11  3:16 AM      Component Value Range Status Comment   Specimen Description URINE, CATHETERIZED   Final    Special Requests NONE   Final    Culture  Setup Time [id number]   Final    Colony Count NO GROWTH   Final    Culture NO GROWTH   Final    Report Status 09/05/2011 FINAL   Final   URINE CULTURE     Status: Normal   Collection Time   09/22/11  6:12 AM      Component Value Range Status Comment   Specimen Description URINE, CATHETERIZED   Final    Special Requests NONE   Final    Culture  Setup Time 09/22/2011 12:53   Final    Colony Count >=100,000 COLONIES/ML   Final    Culture     Final    Value: ESCHERICHIA COLI     Note: Confirmed Extended Spectrum Beta-Lactamase Producer (ESBL) CRITICAL RESULT CALLED TO, READ BACK BY AND VERIFIED WITH: ZENEIDA@12 :40PM ON 09/24/11 BY DANTS   Report Status 09/24/2011 FINAL   Final    Organism ID, Bacteria  ESCHERICHIA COLI   Final   MRSA PCR SCREENING     Status: Normal   Collection Time   09/22/11  8:43 AM      Component Value Range Status Comment   MRSA by PCR NEGATIVE  NEGATIVE Final   CULTURE, BLOOD (ROUTINE X 2)     Status: Normal (Preliminary result)   Collection Time   09/22/11 10:04 AM      Component Value Range Status Comment   Specimen Description BLOOD LEFT ARM   Final    Special Requests BOTTLES DRAWN AEROBIC ONLY 10CC   Final    Culture  Setup Time 09/23/2011 02:12   Final    Culture     Final    Value:        BLOOD CULTURE RECEIVED NO GROWTH TO DATE CULTURE WILL BE HELD FOR 5 DAYS BEFORE ISSUING A FINAL NEGATIVE REPORT   Report Status PENDING   Incomplete   CULTURE, BLOOD (ROUTINE X 2)     Status: Normal (Preliminary result)   Collection Time   09/22/11 10:13 AM      Component Value Range Status Comment   Specimen Description  BLOOD LEFT HAND   Final    Special Requests BOTTLES DRAWN AEROBIC ONLY [facility]   Final    Culture  Setup Time 09/23/2011 02:12   Final    Culture     Final    Value:        BLOOD CULTURE RECEIVED NO GROWTH TO DATE CULTURE WILL BE HELD FOR 5 DAYS BEFORE ISSUING A FINAL NEGATIVE REPORT   Report Status PENDING   Incomplete     Medical History: Past Medical History  Diagnosis Date  . Dysphagia   . Diabetes mellitus   . Hypertension   . Hypothyroidism   . Hyperlipemia   . GERD (gastroesophageal reflux disease)   . Depression   . Anxiety   . Acute MI   . Pneumonia   . Stroke   . UTI (urinary tract infection)   . COPD (chronic obstructive pulmonary disease)   . CAD (coronary artery disease)   . Late effects of CVA (cerebrovascular accident)   . DEMENTIA   . Leukocytosis   . Anemia   . Hypokalemia   . Paralytic ileus   . Pleural effusion    Assessment:   Urine culture grew E. Coli, ESBL.   Resistant to Ceftriaxone, which she has had for 3 days.  Also resistant to quinolones.  Sensitive to Imipenem, aminoglycosides, Zosyn and  Trimethoprim/Sulfa.  Goal of Therapy:  appropriate Imipenem dose for renal function and infection  Plan:    Imipenem 500 mg IV q8hrs begun.   Will follow up renal function for any need to adjust dosing, and follow-up for length of therapy.  Dennie Fetters 09/24/2011,9:16 PM

## 2011-09-24 NOTE — Progress Notes (Signed)
MD texted about the Urine culture results.

## 2011-09-24 NOTE — Progress Notes (Addendum)
[name] is a 78 y.o. female admitted from [facility] in [place], who came in with hypoglycemia and unresponsiveness. She was found to have sepsis due to E.coli UTI. Per family, who are at bed side, she is better, as she can talk now. I have reviewed her chart, seen and examined her at bed side. She is quite sleepy and denies any complaints.  1. Acute encephalopathy   2. Hypoglycemia   3. Hypothermic shock   4. UTI (lower urinary tract infection)   5. Bradycardia   6. Confusion   7. Fever     Past Medical History  Diagnosis Date  . Dysphagia   . Diabetes mellitus   . Hypertension   . Hypothyroidism   . Hyperlipemia   . GERD (gastroesophageal reflux disease)   . Depression   . Anxiety   . Acute MI   . Pneumonia   . Stroke   . UTI (urinary tract infection)   . COPD (chronic obstructive pulmonary disease)   . CAD (coronary artery disease)   . Late effects of CVA (cerebrovascular accident)   . DEMENTIA   . Leukocytosis   . Anemia   . Hypokalemia   . Paralytic ileus   . Pleural effusion    Current Facility-Administered Medications  Medication Dose Route Frequency Provider Last Rate Last Dose  . acetaminophen (TYLENOL) tablet 650 mg  650 mg Oral Q4H PRN Russella Dar, NP      . albuterol (PROVENTIL) (5 MG/ML) 0.5% nebulizer solution 2.5 mg  2.5 mg Nebulization Q2H PRN Russella Dar, NP      . antiseptic oral rinse (BIOTENE) solution 15 mL  15 mL Mouth Rinse BID Lonia Blood, MD   15 mL at 09/24/11 0909  . aspirin chewable tablet 81 mg  81 mg Oral Daily Russella Dar, NP   81 mg at 09/24/11 1043  . beta carotene w/minerals (OCUVITE) tablet 1 tablet  1 tablet Oral Daily Russella Dar, NP   1 tablet at 09/24/11 1043  . cefTRIAXone (ROCEPHIN) 1 g in dextrose 5 % 50 mL IVPB  1 g Intravenous Q24H Hessie Diener Markle, PHARMD   1 g at 09/24/11 0700  . demeclocycline (DECLOMYCIN) tablet 300 mg  300 mg Oral Q12H Russella Dar, NP   300 mg at 09/24/11 0908  .  dextrose (GLUTOSE) 40 % oral gel 37.5 g  1 Tube Oral PRN Russella Dar, NP      . diltiazem (CARDIZEM CD) 24 hr capsule 180 mg  180 mg Oral Daily Russella Dar, NP   180 mg at 09/24/11 1043  . enoxaparin (LOVENOX) injection 40 mg  40 mg Subcutaneous Q24H Russella Dar, NP   40 mg at 09/24/11 0909  . feeding supplement (ENSURE COMPLETE) liquid 237 mL  237 mL Oral TID BM Russella Dar, NP   237 mL at 09/24/11 1044  . hydrALAZINE (APRESOLINE) tablet 100 mg  100 mg Oral Q8H Calvert Cantor, MD   100 mg at 09/24/11 9604  . insulin aspart (novoLOG) injection 0-9 Units  0-9 Units Subcutaneous TID WC Caroline More, NP   2 Units at 09/24/11 0910  . levothyroxine (SYNTHROID, LEVOTHROID) tablet 100 mcg  100 mcg Oral QAC breakfast Russella Dar, NP   100 mcg at 09/24/11 0908  . metoprolol (LOPRESSOR) tablet 50 mg  50 mg Oral BID Russella Dar, NP   50 mg at 09/24/11 1044  . pantoprazole (PROTONIX)  EC tablet 40 mg  40 mg Oral Q1200 Russella Dar, NP   40 mg at 09/23/11 1251  . sodium chloride 0.9 % injection 3 mL  3 mL Intravenous Q12H Russella Dar, NP   3 mL at 09/23/11 2157   No Known Allergies Principal Problem:  *SIRS (systemic inflammatory response syndrome) Active Problems:  Fever  Metabolic encephalopathy  COPD (chronic obstructive pulmonary disease)  Hypertension  Dysphagia  Hypothyroidism  CAD (coronary artery disease)  SIADH (syndrome of inappropriate ADH production)  Escherichia coli urinary tract infection  Hypoglycemia  Diastolic CHF, chronic-grade I  DM II (diabetes mellitus, type II), controlled  Hypothermia  Bradycardia suspected related to hypothermia/SIRS  Dehydration  Acute respiratory failure with hypoxia  SVT (supraventricular tachycardia) likely secondary to beta blocker withdrawal and dehydration   Vital signs in last 24 hours: Temp:  [97.7 F (36.5 C)-99.2 F (37.3 C)] 98 F (36.7 C) (07/03 0522) Pulse Rate:  [62-70] 69  (07/03 0522) Resp:  [18-30] 18   (07/03 0522) BP: (151-162)/(43-58) 156/47 mmHg (07/03 0522) SpO2:  [94 %-100 %] 94 % (07/03 0522) Weight:  [83.235 kg (183 lb 8 oz)] 83.235 kg (183 lb 8 oz) (07/03 0522) Weight change: 4.535 kg (10 lb) Last BM Date: 09/21/11  Intake/Output from previous day: 07/02 0701 - 07/03 0700 In: 150 [I.V.:150] Out: 1650 [Urine:1650] Intake/Output this shift: Total I/O In: 240 [P.O.:240] Out: -   Lab Results:  Basename 09/23/11 0355 09/22/11 1004  WBC 7.6 10.9*  HGB 9.1* 10.5*  HCT 28.5* 32.5*  PLT 237 266   BMET  Basename 09/23/11 0355 09/22/11 1004 09/22/11 0548  NA 135 -- 135  K 4.3 -- 4.1  CL 98 -- 94*  CO2 29 -- 33*  GLUCOSE 155* -- 122*  BUN 9 -- 10  CREATININE 0.82 0.83 --  CALCIUM 8.2* -- 8.6    Studies/Results: No results found.  Medications: I have reviewed the patient's current medications.   Physical exam GENERAL- alert HEAD- normal atraumatic, no neck masses, normal thyroid, no jvd RESPIRATORY- appears well, vitals normal, no respiratory distress, acyanotic, normal RR, ear and throat exam is normal, neck free of mass or lymphadenopathy, chest clear, no wheezing, crepitations, rhonchi, normal symmetric air entry CVS- regular rate and rhythm, S1, S2 normal, no murmur, click, rub or gallop ABDOMEN- right costophrenic ankle tenderness to deep palpation. NEURO- Grossly normal EXTREMITIES- extremities normal, atraumatic, no cyanosis or edema  Plan  * Sepsis of a urinary source(from E. Coli uti, possible acute pyelonephritis)- seems resolving, BP now stable, hypothermia resolved. Await E.coli sensitivities. Plan 10-14 days of abx. *  DM2- BP fluctuating. Appetite still poor. Continue ssi. Low dose lantus. *  Htn- generally controlled. Monitor on current meds. *  Hypothyroidism- on synthroid. * GERD- on ppi. * dvt prophylaxis disposition eventually back to snf. Discussed plan of care with daughter at bed side.   Shawntae Lowy 09/24/2011 11:28 AM Pager:  [phone].   UC positive for ESBL- will d/c ceftriaxone, add imipenem, institute continue contact isolation.  Simbsio Tenika Keeran,MD pager#[phone].

## 2011-09-24 NOTE — Progress Notes (Signed)
Order received, chart reviewed, noted pt able to participate some with PT but very weak.  Also noted pt from SNF and to return to SNF. Will defer OT eval to therapy staff at [facility]. Acute OT will sign off.  Ignacia Palma, [place] [phone] 09/24/2011

## 2011-09-24 NOTE — Progress Notes (Signed)
Clinical Social Work  CSW contacted SNF [facility]) and updated facility on patient's [employer]. SNF is agreeable to admission when patient is medically stable. CSW will continue to follow.  [place], [place] [phone]

## 2011-09-25 LAB — GLUCOSE, CAPILLARY: Glucose-Capillary: 170 mg/dL — ABNORMAL HIGH (ref 70–99)

## 2011-09-25 MED ORDER — INSULIN GLARGINE 100 UNIT/ML ~~LOC~~ SOLN
8.0000 [IU] | Freq: Every day | SUBCUTANEOUS | Status: DC
  Administered 2011-09-26 – 2011-09-27 (×2): 8 [IU] via SUBCUTANEOUS

## 2011-09-25 NOTE — Progress Notes (Signed)
SUBJECTIVE "ok". Per daughter, she ate a little.   1. Acute encephalopathy   2. Hypoglycemia   3. Hypothermic shock   4. UTI (lower urinary tract infection)   5. Bradycardia   6. Confusion   7. Fever     Past Medical History  Diagnosis Date  . Dysphagia   . Diabetes mellitus   . Hypertension   . Hypothyroidism   . Hyperlipemia   . GERD (gastroesophageal reflux disease)   . Depression   . Anxiety   . Acute MI   . Pneumonia   . Stroke   . UTI (urinary tract infection)   . COPD (chronic obstructive pulmonary disease)   . CAD (coronary artery disease)   . Late effects of CVA (cerebrovascular accident)   . DEMENTIA   . Leukocytosis   . Anemia   . Hypokalemia   . Paralytic ileus   . Pleural effusion    Current Facility-Administered Medications  Medication Dose Route Frequency Provider Last Rate Last Dose  . acetaminophen (TYLENOL) tablet 650 mg  650 mg Oral Q4H PRN Russella Dar, NP      . albuterol (PROVENTIL) (5 MG/ML) 0.5% nebulizer solution 2.5 mg  2.5 mg Nebulization Q2H PRN Russella Dar, NP      . antiseptic oral rinse (BIOTENE) solution 15 mL  15 mL Mouth Rinse BID Lonia Blood, MD   15 mL at 09/25/11 0800  . aspirin chewable tablet 81 mg  81 mg Oral Daily Russella Dar, NP   81 mg at 09/25/11 1152  . beta carotene w/minerals (OCUVITE) tablet 1 tablet  1 tablet Oral Daily Russella Dar, NP   1 tablet at 09/25/11 1152  . demeclocycline (DECLOMYCIN) tablet 300 mg  300 mg Oral Q12H Russella Dar, NP   300 mg at 09/25/11 0841  . dextrose (GLUTOSE) 40 % oral gel 37.5 g  1 Tube Oral PRN Russella Dar, NP      . diltiazem (CARDIZEM CD) 24 hr capsule 180 mg  180 mg Oral Daily Russella Dar, NP   180 mg at 09/25/11 1152  . enoxaparin (LOVENOX) injection 40 mg  40 mg Subcutaneous Q24H Russella Dar, NP   40 mg at 09/25/11 0842  . feeding supplement (ENSURE COMPLETE) liquid 237 mL  237 mL Oral TID BM Russella Dar, NP   237 mL at 09/24/11 1338  .  hydrALAZINE (APRESOLINE) tablet 100 mg  100 mg Oral Q8H Saima Rizwan, MD   100 mg at 09/24/11 2329  . imipenem-cilastatin (PRIMAXIN) 500 mg in sodium chloride 0.9 % 100 mL IVPB  500 mg Intravenous Q8H Jerimie Mancuso, MD   500 mg at 09/25/11 0843  . insulin aspart (novoLOG) injection 0-9 Units  0-9 Units Subcutaneous TID WC Caroline More, NP   2 Units at 09/25/11 1244  . insulin glargine (LANTUS) injection 4 Units  4 Units Subcutaneous Daily Dorethia Jeanmarie, MD   4 Units at 09/25/11 1153  . levothyroxine (SYNTHROID, LEVOTHROID) tablet 100 mcg  100 mcg Oral QAC breakfast Russella Dar, NP   100 mcg at 09/25/11 0841  . metoprolol (LOPRESSOR) tablet 50 mg  50 mg Oral BID Russella Dar, NP   50 mg at 09/25/11 1152  . pantoprazole (PROTONIX) EC tablet 40 mg  40 mg Oral Q1200 Russella Dar, NP   40 mg at 09/25/11 1152  . sodium chloride 0.9 % injection 3 mL  3 mL Intravenous Q12H  Russella Dar, NP   3 mL at 09/23/11 2157  . DISCONTD: cefTRIAXone (ROCEPHIN) 1 g in dextrose 5 % 50 mL IVPB  1 g Intravenous Q24H Fredrik Rigger, PHARMD   1 g at 09/24/11 0700   No Known Allergies Principal Problem:  *SIRS (systemic inflammatory response syndrome) Active Problems:  Fever  Metabolic encephalopathy  COPD (chronic obstructive pulmonary disease)  Hypertension  Dysphagia  Hypothyroidism  CAD (coronary artery disease)  SIADH (syndrome of inappropriate ADH production)  Escherichia coli urinary tract infection  Hypoglycemia  Diastolic CHF, chronic-grade I  DM II (diabetes mellitus, type II), controlled  Hypothermia  Bradycardia suspected related to hypothermia/SIRS  Dehydration  Acute respiratory failure with hypoxia  SVT (supraventricular tachycardia) likely secondary to beta blocker withdrawal and dehydration   Vital signs in last 24 hours: Temp:  [98 F (36.7 C)-98.6 F (37 C)] 98 F (36.7 C) (07/04 0629) Pulse Rate:  [61-67] 66  (07/04 0629) Resp:  [18] 18  (07/04 0629) BP:  (132-162)/(40-48) 162/40 mmHg (07/04 0629) SpO2:  [97 %-100 %] 97 % (07/04 0629) Weight:  [83.1 kg (183 lb 3.2 oz)] 83.1 kg (183 lb 3.2 oz) (07/04 0629) Weight change: -0.135 kg (-4.8 oz) Last BM Date: 09/24/11  Intake/Output from previous day: 07/03 0701 - 07/04 0700 In: 840 [P.O.:840] Out: 1650 [Urine:1650] Intake/Output this shift: Total I/O In: 240 [P.O.:240] Out: 400 [Urine:400]  Lab Results:  [facility] 09/23/11 0355  WBC 7.6  HGB 9.1*  HCT 28.5*  PLT 237   BMET  Basename 09/23/11 0355  NA 135  K 4.3  CL 98  CO2 29  GLUCOSE 155*  BUN 9  CREATININE 0.82  CALCIUM 8.2*    Studies/Results: No results found.  Medications: I have reviewed the patient's current medications.   Physical exam GENERAL- alert HEAD- normal atraumatic, no neck masses, normal thyroid, no jvd RESPIRATORY- appears well, vitals normal, no respiratory distress, acyanotic, normal RR, ear and throat exam is normal, neck free of mass or lymphadenopathy, chest clear, no wheezing, crepitations, rhonchi, normal symmetric air entry CVS- regular rate and rhythm, S1, S2 normal, no murmur, click, rub or gallop ABDOMEN- abdomen is soft without significant tenderness, masses, organomegaly or guarding NEURO- Grossly normal EXTREMITIES- extremities normal, atraumatic, no cyanosis or edema  Plan   * Sepsis of a urinary source(from ESBL, possible acute pyelonephritis)- D2 Imipenem. Plan 7 days. Afebrile and hemodynamically stable. * DM2- BP fluctuating. Appetite still poor. Continue ssi. Low dose lantus, plan to gradually increase as her appetite improves.  * Htn- generally controlled. Monitor on current meds.  * Hypothyroidism- on synthroid.  * GERD- on ppi.  * dvt prophylaxis  disposition eventually back to snf, early next week. Discussed plan of care with daughter at bed side.    [name] 09/25/2011 1:50 PM Pager: [phone].

## 2011-09-25 NOTE — Progress Notes (Signed)
Physical Therapy Treatment Patient Details Name: [name] MRN: [id number] DOB: 08-29-1918 Today's Date: 09/25/2011 Time: 4098-1191 PT Time Calculation (min): 24 min  PT Assessment / Plan / Recommendation Comments on Treatment Session  Improved sit>stand with practice today but still with heavy posterior lean needing facilitation to prevent falling. Pt left on 3in1, nursing techn notified and aware she will need a 2nd person to assist with transfer back to chair. Daughter aware that pt's frequency is only 2x/wk so to advocate for patient to get up with nursing on off days.     Follow Up Recommendations  Skilled nursing facility    Barriers to Discharge        Equipment Recommendations  Defer to next venue    Recommendations for Other Services    Frequency Min 2X/week   Plan Discharge plan remains appropriate;Frequency remains appropriate    Precautions / Restrictions Precautions Precautions: Fall       Mobility  Bed Mobility Supine to Sit: 3: Mod assist;HOB elevated (30 degrees) Sitting - Scoot to Edge of Bed: 3: Mod assist Details for Bed Mobility Assistance: use of pad to scoot, verbal cues of "wiggle your butt to the edge of the bed" from daughter worked well, sequencing cues to come upright, good use of abs for long sitting but then pulling on therapist to steady and respostion Transfers Transfers: Sit to Stand;Stand to [employer] Transfers Sit to Stand: 1: +2 Total assist;From bed;From chair/3-in-1 Sit to Stand: Patient Percentage: 50% Stand to Sit: 1: +2 Total assist;To chair/3-in-1 Stand to Sit: Patient Percentage: 50% Stand Pivot Transfers: 1: +2 Total assist Stand Pivot Transfers: Patient Percentage: 40% Details for Transfer Assistance: max facilitation and verbal cues for anterior translation of trunk over BOS, pt tending to extend posteriorly with legs extended out in front as she stands requiring heavy facilitation to bring weight forward, cues for  hip/trunk flexion and weight shift anteriorly, sit<>stand x3 improving with practice; SPT pt needing heavy facilitation maintain weight centered over BOS during transfer, used RW for SPT Ambulation/Gait Ambulation/Gait Assistance: 1: +2 Total assist Ambulation/Gait: Patient Percentage: 60% Ambulation Distance (Feet): 3 Feet Assistive device: Rolling walker Ambulation/Gait Assistance Details: short steps, easily fatigued and began extending posteriorly needing heavy facilitation to stay upright, began sitting prior to chair behind her, chair brought up    Exercises General Exercises - Lower Extremity Ankle Circles/Pumps: AROM;Both;10 reps;Supine Shoulder Exercises Shoulder Flexion: AAROM;Right;Left;5 reps;Supine     PT Goals Acute Rehab PT Goals PT Goal: Supine/Side to Sit - Progress: Progressing toward goal PT Goal: Sit to Stand - Progress: Progressing toward goal PT Goal: Stand to Sit - Progress: Progressing toward goal PT Transfer Goal: Bed to Chair/Chair to Bed - Progress: Progressing toward goal PT Goal: Ambulate - Progress: Progressing toward goal  Visit Information  Last PT Received On: 09/25/11 Assistance Needed: +2    Subjective Data  Subjective: daughter: "I was wondering if yall worked today."   Cognition  Overall Cognitive Status: History of cognitive impairments - at baseline Arousal/Alertness: Awake/alert Cognition - Other Comments: able to nod or mumble yes/no in response to questions, unable to tell us that she needed to go to the bathroom     Balance     End of Session PT - End of Session Equipment Utilized During Treatment: Gait belt Activity Tolerance: Patient tolerated treatment well Patient left: Other (comment) (on 3in1 with family present) Nurse Communication: Mobility status (nursing tech notified)   GP     [facility]  HELEN 09/25/2011, 3:20 PM

## 2011-09-25 NOTE — Progress Notes (Signed)
Clinical Social Work  Per MD notes, patient is not ready to dc until next week. CSW contact SNF [facility]) and left a message regarding patient's dc plans. SNF is agreeable to admission when patient is medically stable. CSW will continue to follow to assist with needs.  [place], [place] [phone]

## 2011-09-26 ENCOUNTER — Inpatient Hospital Stay (HOSPITAL_COMMUNITY): Payer: Medicare Other

## 2011-09-26 LAB — CBC
HCT: 29.9 % — ABNORMAL LOW (ref 36.0–46.0)
Hemoglobin: 9.4 g/dL — ABNORMAL LOW (ref 12.0–15.0)
MCH: 29.1 pg (ref 26.0–34.0)
MCHC: 31.4 g/dL (ref 30.0–36.0)
MCV: 92.6 fL (ref 78.0–100.0)
RBC: 3.23 MIL/uL — ABNORMAL LOW (ref 3.87–5.11)

## 2011-09-26 LAB — COMPREHENSIVE METABOLIC PANEL
ALT: 14 U/L (ref 0–35)
AST: 12 U/L (ref 0–37)
Alkaline Phosphatase: 92 U/L (ref 39–117)
CO2: 28 mEq/L (ref 19–32)
Calcium: 8.8 mg/dL (ref 8.4–10.5)
Glucose, Bld: 192 mg/dL — ABNORMAL HIGH (ref 70–99)
Potassium: 4.2 mEq/L (ref 3.5–5.1)
Sodium: 136 mEq/L (ref 135–145)
Total Protein: 5.5 g/dL — ABNORMAL LOW (ref 6.0–8.3)

## 2011-09-26 LAB — GLUCOSE, CAPILLARY: Glucose-Capillary: 220 mg/dL — ABNORMAL HIGH (ref 70–99)

## 2011-09-26 MED ORDER — ONDANSETRON HCL 4 MG/2ML IJ SOLN
4.0000 mg | Freq: Four times a day (QID) | INTRAMUSCULAR | Status: DC | PRN
  Administered 2011-09-26: 4 mg via INTRAVENOUS
  Filled 2011-09-26: qty 2

## 2011-09-26 NOTE — Progress Notes (Signed)
SUBJECTIVE "I am not feeling good today". She is not elaborating, but suggests she has stomach upset.  1. Acute encephalopathy   2. Hypoglycemia   3. Hypothermic shock   4. UTI (lower urinary tract infection)   5. Bradycardia   6. Confusion   7. Fever     Past Medical History  Diagnosis Date  . Dysphagia   . Diabetes mellitus   . Hypertension   . Hypothyroidism   . Hyperlipemia   . GERD (gastroesophageal reflux disease)   . Depression   . Anxiety   . Acute MI   . Pneumonia   . Stroke   . UTI (urinary tract infection)   . COPD (chronic obstructive pulmonary disease)   . CAD (coronary artery disease)   . Late effects of CVA (cerebrovascular accident)   . DEMENTIA   . Leukocytosis   . Anemia   . Hypokalemia   . Paralytic ileus   . Pleural effusion    Current Facility-Administered Medications  Medication Dose Route Frequency Provider Last Rate Last Dose  . acetaminophen (TYLENOL) tablet 650 mg  650 mg Oral Q4H PRN Russella Dar, NP      . albuterol (PROVENTIL) (5 MG/ML) 0.5% nebulizer solution 2.5 mg  2.5 mg Nebulization Q2H PRN Russella Dar, NP      . antiseptic oral rinse (BIOTENE) solution 15 mL  15 mL Mouth Rinse BID Lonia Blood, MD   15 mL at 09/26/11 0824  . aspirin chewable tablet 81 mg  81 mg Oral Daily Russella Dar, NP   81 mg at 09/26/11 1115  . beta carotene w/minerals (OCUVITE) tablet 1 tablet  1 tablet Oral Daily Russella Dar, NP   1 tablet at 09/26/11 1116  . demeclocycline (DECLOMYCIN) tablet 300 mg  300 mg Oral Q12H Russella Dar, NP   300 mg at 09/26/11 3244  . dextrose (GLUTOSE) 40 % oral gel 37.5 g  1 Tube Oral PRN Russella Dar, NP      . diltiazem (CARDIZEM CD) 24 hr capsule 180 mg  180 mg Oral Daily Russella Dar, NP   180 mg at 09/26/11 1117  . enoxaparin (LOVENOX) injection 40 mg  40 mg Subcutaneous Q24H Russella Dar, NP   40 mg at 09/26/11 0102  . feeding supplement (ENSURE COMPLETE) liquid 237 mL  237 mL Oral TID BM  Russella Dar, NP   237 mL at 09/26/11 1400  . hydrALAZINE (APRESOLINE) tablet 100 mg  100 mg Oral Q8H Saima Rizwan, MD   100 mg at 09/26/11 1350  . imipenem-cilastatin (PRIMAXIN) 500 mg in sodium chloride 0.9 % 100 mL IVPB  500 mg Intravenous Q8H Brodie Scovell, MD   500 mg at 09/26/11 1604  . insulin aspart (novoLOG) injection 0-9 Units  0-9 Units Subcutaneous TID WC Caroline More, NP   2 Units at 09/26/11 1250  . insulin glargine (LANTUS) injection 8 Units  8 Units Subcutaneous Daily Windsor Goeken, MD   8 Units at 09/26/11 1000  . levothyroxine (SYNTHROID, LEVOTHROID) tablet 100 mcg  100 mcg Oral QAC breakfast Russella Dar, NP   100 mcg at 09/26/11 7253  . metoprolol (LOPRESSOR) tablet 50 mg  50 mg Oral BID Russella Dar, NP   50 mg at 09/26/11 1119  . pantoprazole (PROTONIX) EC tablet 40 mg  40 mg Oral Q1200 Russella Dar, NP   40 mg at 09/26/11 1120  . sodium chloride 0.9 %  injection 3 mL  3 mL Intravenous Q12H Russella Dar, NP   3 mL at 09/26/11 1119   No Known Allergies Principal Problem:  *SIRS (systemic inflammatory response syndrome) Active Problems:  Fever  Metabolic encephalopathy  COPD (chronic obstructive pulmonary disease)  Hypertension  Dysphagia  Hypothyroidism  CAD (coronary artery disease)  SIADH (syndrome of inappropriate ADH production)  Escherichia coli urinary tract infection  Hypoglycemia  Diastolic CHF, chronic-grade I  DM II (diabetes mellitus, type II), controlled  Hypothermia  Bradycardia suspected related to hypothermia/SIRS  Dehydration  Acute respiratory failure with hypoxia  SVT (supraventricular tachycardia) likely secondary to beta blocker withdrawal and dehydration   Vital signs in last 24 hours: Temp:  [97.8 F (36.6 C)-98.4 F (36.9 C)] 98.4 F (36.9 C) (07/05 1400) Pulse Rate:  [69-89] 89  (07/05 1400) Resp:  [16-20] 20  (07/05 1400) BP: (152-176)/(60-72) 159/72 mmHg (07/05 1400) SpO2:  [98 %-99 %] 98 % (07/05 1400) Weight  change:  Last BM Date: 09/24/11  Intake/Output from previous day: 07/04 0701 - 07/05 0700 In: 840 [P.O.:840] Out: 1150 [Urine:1150] Intake/Output this shift: Total I/O In: 360 [P.O.:360] Out: -   Lab Results:  [facility] 09/26/11 0903  WBC 9.4  HGB 9.4*  HCT 29.9*  PLT 229   BMET  Basename 09/26/11 0638  NA 136  K 4.2  CL 97  CO2 28  GLUCOSE 192*  BUN 8  CREATININE 0.74  CALCIUM 8.8    Studies/Results: No results found.  Medications: I have reviewed the patient's current medications.   Physical exam GENERAL- alert HEAD- normal atraumatic, no neck masses, normal thyroid, no jvd RESPIRATORY- appears well, vitals normal, no respiratory distress, acyanotic, normal RR, ear and throat exam is normal, neck free of mass or lymphadenopathy, chest clear, no wheezing, crepitations, rhonchi, normal symmetric air entry CVS- regular rate and rhythm, S1, S2 normal, no murmur, click, rub or gallop ABDOMEN- abdomen is soft without significant tenderness, masses, organomegaly or guarding NEURO- Grossly normal EXTREMITIES- extremities normal, atraumatic, no cyanosis or edema  Plan   * Sepsis of a urinary source(from ESBL, possible acute pyelonephritis)- D3 Imipenem. Plan 7 days. Afebrile and hemodynamically stable.  * DM2- BP fluctuating. Appetite still poor. Continue ssi. Low dose lantus, plan to gradually increase as her appetite improves.  * Htn- generally controlled. Monitor on current meds.  * Hypothyroidism- on synthroid.  * GERD- on ppi. * Not sure of etiology, check abdominal xray, lipase.  * dvt prophylaxis  disposition eventually back to snf, early next week.    Hayde Kilgour 09/26/2011 4:40 PM Pager: [phone].

## 2011-09-27 ENCOUNTER — Inpatient Hospital Stay (HOSPITAL_COMMUNITY): Payer: Medicare Other

## 2011-09-27 LAB — GLUCOSE, CAPILLARY
Glucose-Capillary: 173 mg/dL — ABNORMAL HIGH (ref 70–99)
Glucose-Capillary: 171 mg/dL — ABNORMAL HIGH (ref 70–99)
Glucose-Capillary: 200 mg/dL — ABNORMAL HIGH (ref 70–99)
Glucose-Capillary: 190 mg/dL — ABNORMAL HIGH (ref 70–99)

## 2011-09-27 MED ORDER — ALBUTEROL SULFATE (5 MG/ML) 0.5% IN NEBU
2.5000 mg | INHALATION_SOLUTION | Freq: Four times a day (QID) | RESPIRATORY_TRACT | Status: DC
  Administered 2011-09-27 – 2011-09-30 (×9): 2.5 mg via RESPIRATORY_TRACT
  Filled 2011-09-27 (×12): qty 0.5

## 2011-09-27 MED ORDER — POTASSIUM CHLORIDE CRYS ER 20 MEQ PO TBCR
20.0000 meq | EXTENDED_RELEASE_TABLET | Freq: Every day | ORAL | Status: DC
  Filled 2011-09-27: qty 1

## 2011-09-27 MED ORDER — DEMECLOCYCLINE HCL 150 MG PO TABS
150.0000 mg | ORAL_TABLET | Freq: Two times a day (BID) | ORAL | Status: DC
  Administered 2011-09-27 – 2011-09-30 (×6): 150 mg via ORAL
  Filled 2011-09-27 (×8): qty 1

## 2011-09-27 MED ORDER — FUROSEMIDE 10 MG/ML IJ SOLN
40.0000 mg | Freq: Once | INTRAMUSCULAR | Status: AC
  Administered 2011-09-27: 40 mg via INTRAVENOUS
  Filled 2011-09-27 (×2): qty 4

## 2011-09-27 MED ORDER — POTASSIUM CHLORIDE 20 MEQ/15ML (10%) PO LIQD
20.0000 meq | Freq: Every day | ORAL | Status: DC
  Administered 2011-09-27 – 2011-09-30 (×4): 20 meq via ORAL
  Filled 2011-09-27 (×4): qty 15

## 2011-09-27 MED ORDER — FUROSEMIDE 40 MG PO TABS
40.0000 mg | ORAL_TABLET | Freq: Every day | ORAL | Status: DC
  Administered 2011-09-27 – 2011-09-30 (×4): 40 mg via ORAL
  Filled 2011-09-27 (×4): qty 1

## 2011-09-27 MED ORDER — INSULIN GLARGINE 100 UNIT/ML ~~LOC~~ SOLN
10.0000 [IU] | Freq: Every day | SUBCUTANEOUS | Status: DC
  Administered 2011-09-28: 10 [IU] via SUBCUTANEOUS

## 2011-09-27 NOTE — Progress Notes (Signed)
ANTIBIOTIC CONSULT NOTE - Follow Up  Pharmacy Consult for Imipenem Indication: UTI - E coli, ESBL; also covering for pyelonephritis & sepsis  No Known Allergies  Patient Measurements: Height: 5\' 9"  (175.3 cm) Weight: 181 lb 10.5 oz (82.4 kg) IBW/kg (Calculated) : 66.2   Vital Signs: Temp: 97.6 F (36.4 C) (07/06 0525) Temp src: Axillary (07/06 0525) BP: 168/54 mmHg (07/06 0656) Pulse Rate: 67  (07/06 0525) Intake/Output from previous day: 07/05 0701 - 07/06 0700 In: 480 [P.O.:480] Out: 1225 [Urine:1225]   Labs:  [facility] 09/26/11 0903 09/26/11 0638  WBC 9.4 --  HGB 9.4* --  PLT 229 --  LABCREA -- --  CREATININE -- 0.74   Estimated Creatinine Clearance: 53.6 ml/min (by C-G formula based on Cr of 0.74).  Microbiology: Recent Results (from the past 720 hour(s))  MRSA PCR SCREENING     Status: Normal   Collection Time   08/30/11 11:34 PM      Component Value Range Status Comment   MRSA by PCR NEGATIVE  NEGATIVE Final   URINE CULTURE     Status: Normal   Collection Time   09/04/11  3:16 AM      Component Value Range Status Comment   Specimen Description URINE, CATHETERIZED   Final    Special Requests NONE   Final    Culture  Setup Time [id number]   Final    Colony Count NO GROWTH   Final    Culture NO GROWTH   Final    Report Status 09/05/2011 FINAL   Final   URINE CULTURE     Status: Normal   Collection Time   09/22/11  6:12 AM      Component Value Range Status Comment   Specimen Description URINE, CATHETERIZED   Final    Special Requests NONE   Final    Culture  Setup Time 09/22/2011 12:53   Final    Colony Count >=100,000 COLONIES/ML   Final    Culture     Final    Value: ESCHERICHIA COLI     Note: Confirmed Extended Spectrum Beta-Lactamase Producer (ESBL) CRITICAL RESULT CALLED TO, READ BACK BY AND VERIFIED WITH: ZENEIDA@12 :40PM ON 09/24/11 BY DANTS   Report Status 09/24/2011 FINAL   Final    Organism ID, Bacteria ESCHERICHIA COLI   Final   MRSA PCR  SCREENING     Status: Normal   Collection Time   09/22/11  8:43 AM      Component Value Range Status Comment   MRSA by PCR NEGATIVE  NEGATIVE Final   CULTURE, BLOOD (ROUTINE X 2)     Status: Normal (Preliminary result)   Collection Time   09/22/11 10:04 AM      Component Value Range Status Comment   Specimen Description BLOOD LEFT ARM   Final    Special Requests BOTTLES DRAWN AEROBIC ONLY 10CC   Final    Culture  Setup Time 09/23/2011 02:12   Final    Culture     Final    Value:        BLOOD CULTURE RECEIVED NO GROWTH TO DATE CULTURE WILL BE HELD FOR 5 DAYS BEFORE ISSUING A FINAL NEGATIVE REPORT   Report Status PENDING   Incomplete   CULTURE, BLOOD (ROUTINE X 2)     Status: Normal (Preliminary result)   Collection Time   09/22/11 10:13 AM      Component Value Range Status Comment   Specimen Description BLOOD LEFT HAND   Final  Special Requests BOTTLES DRAWN AEROBIC ONLY [facility]   Final    Culture  Setup Time 09/23/2011 02:12   Final    Culture     Final    Value:        BLOOD CULTURE RECEIVED NO GROWTH TO DATE CULTURE WILL BE HELD FOR 5 DAYS BEFORE ISSUING A FINAL NEGATIVE REPORT   Report Status PENDING   Incomplete     Medical History: Past Medical History  Diagnosis Date  . Dysphagia   . Diabetes mellitus   . Hypertension   . Hypothyroidism   . Hyperlipemia   . GERD (gastroesophageal reflux disease)   . Depression   . Anxiety   . Acute MI   . Pneumonia   . Stroke   . UTI (urinary tract infection)   . COPD (chronic obstructive pulmonary disease)   . CAD (coronary artery disease)   . Late effects of CVA (cerebrovascular accident)   . DEMENTIA   . Leukocytosis   . Anemia   . Hypokalemia   . Paralytic ileus   . Pleural effusion    Assessment:   Urine culture grew E. Coli, ESBL.   Resistant to Ceftriaxone, which she has had for 3 days.  Also resistant to quinolones.  Sensitive to Imipenem, aminoglycosides, Zosyn and Trimethoprim/Sulfa. This is day 4/7 primaxin.  Goal of  Therapy:  appropriate Imipenem dose for renal function and infection  Plan:    Imipenem 500 mg IV q8hrs.   Will follow up renal function for any need to adjust dosing.  Talbert Cage Poteet 09/27/2011,8:21 AM

## 2011-09-27 NOTE — Progress Notes (Signed)
SUBJECTIVE Feels ok. No complaints. Daughter notes that [name] has been using her abdominal muscles for breathing more.   1. Acute encephalopathy   2. Hypoglycemia   3. Hypothermic shock   4. UTI (lower urinary tract infection)   5. Bradycardia   6. Confusion   7. Fever     Past Medical History  Diagnosis Date  . Dysphagia   . Diabetes mellitus   . Hypertension   . Hypothyroidism   . Hyperlipemia   . GERD (gastroesophageal reflux disease)   . Depression   . Anxiety   . Acute MI   . Pneumonia   . Stroke   . UTI (urinary tract infection)   . COPD (chronic obstructive pulmonary disease)   . CAD (coronary artery disease)   . Late effects of CVA (cerebrovascular accident)   . DEMENTIA   . Leukocytosis   . Anemia   . Hypokalemia   . Paralytic ileus   . Pleural effusion    Current Facility-Administered Medications  Medication Dose Route Frequency Provider Last Rate Last Dose  . acetaminophen (TYLENOL) tablet 650 mg  650 mg Oral Q4H PRN Russella Dar, NP      . albuterol (PROVENTIL) (5 MG/ML) 0.5% nebulizer solution 2.5 mg  2.5 mg Nebulization Q2H PRN Russella Dar, NP      . albuterol (PROVENTIL) (5 MG/ML) 0.5% nebulizer solution 2.5 mg  2.5 mg Nebulization Q6H Gibson Lad, MD      . antiseptic oral rinse (BIOTENE) solution 15 mL  15 mL Mouth Rinse BID Lonia Blood, MD   15 mL at 09/27/11 0800  . aspirin chewable tablet 81 mg  81 mg Oral Daily Russella Dar, NP   81 mg at 09/27/11 1154  . beta carotene w/minerals (OCUVITE) tablet 1 tablet  1 tablet Oral Daily Russella Dar, NP   1 tablet at 09/27/11 1155  . demeclocycline (DECLOMYCIN) tablet 150 mg  150 mg Oral Q12H Chizaram Latino, MD      . dextrose (GLUTOSE) 40 % oral gel 37.5 g  1 Tube Oral PRN Russella Dar, NP      . diltiazem (CARDIZEM CD) 24 hr capsule 180 mg  180 mg Oral Daily Russella Dar, NP   180 mg at 09/27/11 1155  . enoxaparin (LOVENOX) injection 40 mg  40 mg Subcutaneous Q24H Russella Dar, NP   40 mg at 09/27/11 0856  . feeding supplement (ENSURE COMPLETE) liquid 237 mL  237 mL Oral TID BM Russella Dar, NP   237 mL at 09/26/11 2017  . furosemide (LASIX) injection 40 mg  40 mg Intravenous Once Coltan Spinello, MD   40 mg at 09/27/11 1427  . furosemide (LASIX) tablet 40 mg  40 mg Oral Daily Kathan Kirker, MD      . hydrALAZINE (APRESOLINE) tablet 100 mg  100 mg Oral Q8H Saima Rizwan, MD   100 mg at 09/26/11 2331  . imipenem-cilastatin (PRIMAXIN) 500 mg in sodium chloride 0.9 % 100 mL IVPB  500 mg Intravenous Q8H Donzel Romack, MD   500 mg at 09/27/11 0855  . insulin aspart (novoLOG) injection 0-9 Units  0-9 Units Subcutaneous TID WC Caroline More, NP   2 Units at 09/27/11 1159  . insulin glargine (LANTUS) injection 8 Units  8 Units Subcutaneous Daily Latash Nouri, MD   8 Units at 09/27/11 1156  . levothyroxine (SYNTHROID, LEVOTHROID) tablet 100 mcg  100 mcg Oral QAC breakfast Revonda Standard  Audry Pili, NP   100 mcg at 09/27/11 0855  . metoprolol (LOPRESSOR) tablet 50 mg  50 mg Oral BID Russella Dar, NP   50 mg at 09/27/11 1155  . ondansetron (ZOFRAN) injection 4 mg  4 mg Intravenous Q6H PRN Jamielyn Petrucci, MD   4 mg at 09/26/11 2145  . pantoprazole (PROTONIX) EC tablet 40 mg  40 mg Oral Q1200 Russella Dar, NP   40 mg at 09/27/11 1156  . potassium chloride 20 MEQ/15ML (10%) liquid 20 mEq  20 mEq Oral Daily Rishik Tubby, MD   20 mEq at 09/27/11 1431  . sodium chloride 0.9 % injection 3 mL  3 mL Intravenous Q12H Russella Dar, NP   3 mL at 09/27/11 1155  . DISCONTD: demeclocycline (DECLOMYCIN) tablet 300 mg  300 mg Oral Q12H Russella Dar, NP   300 mg at 09/27/11 0855  . DISCONTD: potassium chloride SA (K-DUR,KLOR-CON) CR tablet 20 mEq  20 mEq Oral Daily Henrique Parekh, MD       No Known Allergies Principal Problem:  *SIRS (systemic inflammatory response syndrome) Active Problems:  Fever  Metabolic encephalopathy  COPD (chronic obstructive pulmonary disease)  Hypertension   Dysphagia  Hypothyroidism  CAD (coronary artery disease)  SIADH (syndrome of inappropriate ADH production)  Escherichia coli urinary tract infection  Hypoglycemia  Diastolic CHF, chronic-grade I  DM II (diabetes mellitus, type II), controlled  Hypothermia  Bradycardia suspected related to hypothermia/SIRS  Dehydration  Acute respiratory failure with hypoxia  SVT (supraventricular tachycardia) likely secondary to beta blocker withdrawal and dehydration   Vital signs in last 24 hours: Temp:  [97.6 F (36.4 C)-98.3 F (36.8 C)] 98.3 F (36.8 C) (07/06 0923) Pulse Rate:  [67-72] 72  (07/06 0923) Resp:  [16-24] 24  (07/06 0923) BP: (168-183)/(52-73) 177/54 mmHg (07/06 0923) SpO2:  [99 %-100 %] 100 % (07/06 0923) Weight:  [82.4 kg (181 lb 10.5 oz)] 82.4 kg (181 lb 10.5 oz) (07/06 0525) Weight change:  Last BM Date: 09/24/11  Intake/Output from previous day: 07/05 0701 - 07/06 0700 In: 480 [P.O.:480] Out: 1225 [Urine:1225] Intake/Output this shift: Total I/O In: 376 [P.O.:240; IV Piggyback:136] Out: 950 [Urine:950]  Lab Results:  [name] 09/26/11 0903  WBC 9.4  HGB 9.4*  HCT 29.9*  PLT 229   BMET  Basename 09/26/11 0638  NA 136  K 4.2  CL 97  CO2 28  GLUCOSE 192*  BUN 8  CREATININE 0.74  CALCIUM 8.8    Studies/Results: Dg Abd 1 View  09/26/2011  *RADIOLOGY REPORT*  Clinical Data: Abdominal pain and distention.  ABDOMEN - 1 VIEW  Comparison: 08/14/2011.  Findings: On the supine image the small bowel pattern shows one loop of slightly dilated small intestine in the lower left abdomen. Colonic and rectal gas is present.  Air-fluid levels cannot be assessed without erect or decubitus examination.  No opaque calculi are seen.  Calcified uterine leiomyoma appear stable. Nonaneurysmal arterial calcifications are present.  There is osteopenic appearance of the bones.  There is moderate scoliosis convexity to the right.  Osteophytes are seen representing degenerative  spondylosis.  IMPRESSION:  Supine images were submitted.  Bowel gas pattern is nonspecific and appears to be nonobstructive as can be determined on supine images. Air-fluid levels cannot be assessed on supine image.  Stable calcified uterine leiomyoma.  Nonaneurysmal arterial calcifications.  Chronic bony changes are detailed above.  Original Report Authenticated By: Crawford Givens, M.D.   Dg Chest [name]  09/27/2011  *RADIOLOGY REPORT*  Clinical Data: Shortness of breath.  PORTABLE CHEST - 1 VIEW 09/27/2011 1310 hours:  Comparison: Portable chest x-ray 09/22/2011, 08/30/2011, 08/18/2011, 06/21/2011.  Findings: Cardiac silhouette enlarged but stable.  Pulmonary venous hypertension with minimal interstitial pulmonary edema as evidenced by scattered Kerley B lines.  Small bilateral pleural effusions suspected.  Suboptimal inspiration accounts for mild bibasilar atelectasis.  No confluent airspace consolidation.  IMPRESSION: Stable cardiomegaly.  Minimal interstitial pulmonary edema and small bilateral pleural effusions consistent with minimal CHF.  Original Report Authenticated By: Arnell Sieving, M.D.    Medications: I have reviewed the patient's current medications.   Physical exam GENERAL- alert HEAD- normal atraumatic, no neck masses, normal thyroid, no jvd RESPIRATORY- slight tachypnea. CVS- regular rate and rhythm, S1, S2 normal, no murmur, click, rub or gallop ABDOMEN- soft, non tender.+BS. NEURO- Grossly normal EXTREMITIES- extremities normal, atraumatic, no cyanosis or edema  Plan   * Sepsis of a urinary source(from ESBL, possible acute pyelonephritis)- D4 Imipenem.  Appreciate pharmacy. Plan 7 days. Afebrile and hemodynamically stable.  * DM2- BP fluctuating. Appetite still poor. Continue ssi. Low dose lantus, plan to gradually increase as her appetite improves.  * Htn- generally controlled. Monitor on current meds.  * Hypothyroidism- on synthroid.  * GERD- on ppi.  * Pulmonary  vascular congestion- resume lasix, check bnp.  * dvt prophylaxis  disposition eventually back to snf, early next week.     Amanda Pote 09/27/2011 3:13 PM Pager: [phone].

## 2011-09-28 LAB — CBC
MCH: 29.8 pg (ref 26.0–34.0)
MCHC: 32.2 g/dL (ref 30.0–36.0)
MCV: 92.7 fL (ref 78.0–100.0)
Platelets: 237 10*3/uL (ref 150–400)
RBC: 3.42 MIL/uL — ABNORMAL LOW (ref 3.87–5.11)
RDW: 14.5 % (ref 11.5–15.5)

## 2011-09-28 LAB — GLUCOSE, CAPILLARY
Glucose-Capillary: 222 mg/dL — ABNORMAL HIGH (ref 70–99)
Glucose-Capillary: 171 mg/dL — ABNORMAL HIGH (ref 70–99)

## 2011-09-28 LAB — COMPREHENSIVE METABOLIC PANEL
AST: 12 U/L (ref 0–37)
CO2: 36 mEq/L — ABNORMAL HIGH (ref 19–32)
Calcium: 8.8 mg/dL (ref 8.4–10.5)
Creatinine, Ser: 0.82 mg/dL (ref 0.50–1.10)
GFR calc non Af Amer: 61 mL/min — ABNORMAL LOW (ref >90)
Total Protein: 6 g/dL (ref 6.0–8.3)

## 2011-09-28 MED ORDER — FUROSEMIDE 10 MG/ML IJ SOLN
20.0000 mg | Freq: Once | INTRAMUSCULAR | Status: AC
  Administered 2011-09-28: 20 mg via INTRAVENOUS
  Filled 2011-09-28: qty 2

## 2011-09-28 MED ORDER — INSULIN GLARGINE 100 UNIT/ML ~~LOC~~ SOLN
12.0000 [IU] | Freq: Every day | SUBCUTANEOUS | Status: DC
  Administered 2011-09-29: 12 [IU] via SUBCUTANEOUS

## 2011-09-28 NOTE — Progress Notes (Signed)
SUBJECTIVE Feels ok. No more nausea. Breathing better.   1. Acute encephalopathy   2. Hypoglycemia   3. Hypothermic shock   4. UTI (lower urinary tract infection)   5. Bradycardia   6. Confusion   7. Fever     Past Medical History  Diagnosis Date  . Dysphagia   . Diabetes mellitus   . Hypertension   . Hypothyroidism   . Hyperlipemia   . GERD (gastroesophageal reflux disease)   . Depression   . Anxiety   . Acute MI   . Pneumonia   . Stroke   . UTI (urinary tract infection)   . COPD (chronic obstructive pulmonary disease)   . CAD (coronary artery disease)   . Late effects of CVA (cerebrovascular accident)   . DEMENTIA   . Leukocytosis   . Anemia   . Hypokalemia   . Paralytic ileus   . Pleural effusion    Current Facility-Administered Medications  Medication Dose Route Frequency Provider Last Rate Last Dose  . acetaminophen (TYLENOL) tablet 650 mg  650 mg Oral Q4H PRN Russella Dar, NP      . albuterol (PROVENTIL) (5 MG/ML) 0.5% nebulizer solution 2.5 mg  2.5 mg Nebulization Q2H PRN Russella Dar, NP      . albuterol (PROVENTIL) (5 MG/ML) 0.5% nebulizer solution 2.5 mg  2.5 mg Nebulization Q6H Zeriyah Wain, MD   2.5 mg at 09/28/11 0842  . antiseptic oral rinse (BIOTENE) solution 15 mL  15 mL Mouth Rinse BID Lonia Blood, MD   15 mL at 09/28/11 0800  . aspirin chewable tablet 81 mg  81 mg Oral Daily Russella Dar, NP   81 mg at 09/28/11 1047  . beta carotene w/minerals (OCUVITE) tablet 1 tablet  1 tablet Oral Daily Russella Dar, NP   1 tablet at 09/28/11 1047  . demeclocycline (DECLOMYCIN) tablet 150 mg  150 mg Oral Q12H Kassius Battiste, MD   150 mg at 09/28/11 0903  . dextrose (GLUTOSE) 40 % oral gel 37.5 g  1 Tube Oral PRN Russella Dar, NP      . diltiazem (CARDIZEM CD) 24 hr capsule 180 mg  180 mg Oral Daily Russella Dar, NP   180 mg at 09/28/11 1048  . enoxaparin (LOVENOX) injection 40 mg  40 mg Subcutaneous Q24H Russella Dar, NP   40 mg at  09/28/11 0900  . furosemide (LASIX) injection 20 mg  20 mg Intravenous Once Sharlon Pfohl, MD   20 mg at 09/28/11 1307  . furosemide (LASIX) injection 40 mg  40 mg Intravenous Once Hurman Ketelsen, MD   40 mg at 09/27/11 1427  . furosemide (LASIX) tablet 40 mg  40 mg Oral Daily Merrin Mcvicker, MD   40 mg at 09/28/11 1049  . hydrALAZINE (APRESOLINE) tablet 100 mg  100 mg Oral Q8H Calvert Cantor, MD   100 mg at 09/28/11 0609  . imipenem-cilastatin (PRIMAXIN) 500 mg in sodium chloride 0.9 % 100 mL IVPB  500 mg Intravenous Q8H Annelle Behrendt, MD   500 mg at 09/28/11 0905  . insulin aspart (novoLOG) injection 0-9 Units  0-9 Units Subcutaneous TID WC Caroline More, NP   3 Units at 09/28/11 1308  . insulin glargine (LANTUS) injection 10 Units  10 Units Subcutaneous Daily Jyren Cerasoli, MD   10 Units at 09/28/11 1046  . levothyroxine (SYNTHROID, LEVOTHROID) tablet 100 mcg  100 mcg Oral QAC breakfast Russella Dar, NP   100  mcg at 09/28/11 0903  . metoprolol (LOPRESSOR) tablet 50 mg  50 mg Oral BID Russella Dar, NP   50 mg at 09/28/11 1048  . ondansetron (ZOFRAN) injection 4 mg  4 mg Intravenous Q6H PRN Deserai Cansler, MD   4 mg at 09/26/11 2145  . pantoprazole (PROTONIX) EC tablet 40 mg  40 mg Oral Q1200 Russella Dar, NP   40 mg at 09/28/11 1309  . potassium chloride 20 MEQ/15ML (10%) liquid 20 mEq  20 mEq Oral Daily Winson Eichorn, MD   20 mEq at 09/28/11 1049  . sodium chloride 0.9 % injection 3 mL  3 mL Intravenous Q12H Russella Dar, NP   3 mL at 09/28/11 1000  . DISCONTD: feeding supplement (ENSURE COMPLETE) liquid 237 mL  237 mL Oral TID BM Russella Dar, NP   237 mL at 09/26/11 2017  . DISCONTD: insulin glargine (LANTUS) injection 8 Units  8 Units Subcutaneous Daily Syla Devoss, MD   8 Units at 09/27/11 1156   No Known Allergies Principal Problem:  *SIRS (systemic inflammatory response syndrome) Active Problems:  Fever  Metabolic encephalopathy  COPD (chronic obstructive pulmonary  disease)  Hypertension  Dysphagia  Hypothyroidism  CAD (coronary artery disease)  SIADH (syndrome of inappropriate ADH production)  Escherichia coli urinary tract infection  Hypoglycemia  Diastolic CHF, chronic-grade I  DM II (diabetes mellitus, type II), controlled  Hypothermia  Bradycardia suspected related to hypothermia/SIRS  Dehydration  Acute respiratory failure with hypoxia  SVT (supraventricular tachycardia) likely secondary to beta blocker withdrawal and dehydration   Vital signs in last 24 hours: Temp:  [97.9 F (36.6 C)-98.8 F (37.1 C)] 98.8 F (37.1 C) (07/07 1005) Pulse Rate:  [61-70] 70  (07/07 1005) Resp:  [17-22] 20  (07/07 1005) BP: (116-174)/(47-86) 167/50 mmHg (07/07 1005) SpO2:  [96 %-100 %] 100 % (07/07 1005) Weight change:  Last BM Date: 09/27/11  Intake/Output from previous day: 07/06 0701 - 07/07 0700 In: 856 [P.O.:720; IV Piggyback:136] Out: 3150 [Urine:3150] Intake/Output this shift: Total I/O In: 400 [P.O.:400] Out: 225 [Urine:225]  Lab Results:  Basename 09/28/11 0500 09/26/11 0903  WBC 7.5 9.4  HGB 10.2* 9.4*  HCT 31.7* 29.9*  PLT 237 229   BMET  Basename 09/28/11 0500 09/26/11 0638  NA 137 136  K 3.8 4.2  CL 94* 97  CO2 36* 28  GLUCOSE 173* 192*  BUN 9 8  CREATININE 0.82 0.74  CALCIUM 8.8 8.8    Studies/Results: Dg Abd 1 View  09/26/2011  *RADIOLOGY REPORT*  Clinical Data: Abdominal pain and distention.  ABDOMEN - 1 VIEW  Comparison: 08/14/2011.  Findings: On the supine image the small bowel pattern shows one loop of slightly dilated small intestine in the lower left abdomen. Colonic and rectal gas is present.  Air-fluid levels cannot be assessed without erect or decubitus examination.  No opaque calculi are seen.  Calcified uterine leiomyoma appear stable. Nonaneurysmal arterial calcifications are present.  There is osteopenic appearance of the bones.  There is moderate scoliosis convexity to the right.  Osteophytes are seen  representing degenerative spondylosis.  IMPRESSION:  Supine images were submitted.  Bowel gas pattern is nonspecific and appears to be nonobstructive as can be determined on supine images. Air-fluid levels cannot be assessed on supine image.  Stable calcified uterine leiomyoma.  Nonaneurysmal arterial calcifications.  Chronic bony changes are detailed above.  Original Report Authenticated By: Crawford Givens, M.D.   Dg Chest Port 1 View  09/27/2011  *  RADIOLOGY REPORT*  Clinical Data: Shortness of breath.  PORTABLE CHEST - 1 VIEW 09/27/2011 1310 hours:  Comparison: Portable chest x-ray 09/22/2011, 08/30/2011, 08/18/2011, 06/21/2011.  Findings: Cardiac silhouette enlarged but stable.  Pulmonary venous hypertension with minimal interstitial pulmonary edema as evidenced by scattered Kerley B lines.  Small bilateral pleural effusions suspected.  Suboptimal inspiration accounts for mild bibasilar atelectasis.  No confluent airspace consolidation.  IMPRESSION: Stable cardiomegaly.  Minimal interstitial pulmonary edema and small bilateral pleural effusions consistent with minimal CHF.  Original Report Authenticated By: Arnell Sieving, M.D.    Medications: I have reviewed the patient's current medications.   Physical exam GENERAL- alert HEAD- normal atraumatic, no neck masses, normal thyroid, no jvd RESPIRATORY- appears well, vitals normal, no respiratory distress, acyanotic, normal RR, ear and throat exam is normal, neck free of mass or lymphadenopathy, chest clear, no wheezing, crepitations, rhonchi, normal symmetric air entry CVS- regular rate and rhythm, S1, S2 normal, no murmur, click, rub or gallop ABDOMEN- abdomen is soft without significant tenderness, masses, organomegaly or guarding NEURO- Grossly normal EXTREMITIES- extremities normal, atraumatic, no cyanosis or edema  Plan  * Sepsis of a urinary source(from ESBL, possible acute pyelonephritis)- D5 Imipenem. Appreciate pharmacy. Plan 7 days.  Afebrile and hemodynamically stable.  * DM2- BP fluctuating. Appetite improving. Continue ssi. Low dose lantus, Continue gradual increase in lantus* Htn- generally controlled. Monitor on current meds.  * Hypothyroidism- on synthroid.  * GERD- on ppi.  * Pulmonary vascular congestion/grade 1 diastolic chf with acute congestion(bnp3000)- responding to lasix, continue.  * dvt prophylaxis  disposition eventually back to snf, in 1-2 days.    Julee Stoll 09/28/2011 1:21 PM Pager: [phone].

## 2011-09-29 LAB — BASIC METABOLIC PANEL
CO2: 37 mEq/L — ABNORMAL HIGH (ref 19–32)
Calcium: 8.5 mg/dL (ref 8.4–10.5)
GFR calc non Af Amer: 57 mL/min — ABNORMAL LOW (ref >90)
Sodium: 135 mEq/L (ref 135–145)

## 2011-09-29 LAB — GLUCOSE, CAPILLARY: Glucose-Capillary: 189 mg/dL — ABNORMAL HIGH (ref 70–99)

## 2011-09-29 LAB — CULTURE, BLOOD (ROUTINE X 2): Culture: NO GROWTH

## 2011-09-29 LAB — MAGNESIUM: Magnesium: 1.8 mg/dL (ref 1.5–2.5)

## 2011-09-29 MED ORDER — INSULIN GLARGINE 100 UNIT/ML ~~LOC~~ SOLN
14.0000 [IU] | Freq: Every day | SUBCUTANEOUS | Status: DC
  Administered 2011-09-30: 14 [IU] via SUBCUTANEOUS

## 2011-09-29 NOTE — Progress Notes (Signed)
SUBJECTIVE Feels ok, no complaints.   1. Acute encephalopathy   2. Hypoglycemia   3. Hypothermic shock   4. UTI (lower urinary tract infection)   5. Bradycardia   6. Confusion   7. Fever     Past Medical History  Diagnosis Date  . Dysphagia   . Diabetes mellitus   . Hypertension   . Hypothyroidism   . Hyperlipemia   . GERD (gastroesophageal reflux disease)   . Depression   . Anxiety   . Acute MI   . Pneumonia   . Stroke   . UTI (urinary tract infection)   . COPD (chronic obstructive pulmonary disease)   . CAD (coronary artery disease)   . Late effects of CVA (cerebrovascular accident)   . DEMENTIA   . Leukocytosis   . Anemia   . Hypokalemia   . Paralytic ileus   . Pleural effusion    Current Facility-Administered Medications  Medication Dose Route Frequency Provider Last Rate Last Dose  . acetaminophen (TYLENOL) tablet 650 mg  650 mg Oral Q4H PRN Russella Dar, NP      . albuterol (PROVENTIL) (5 MG/ML) 0.5% nebulizer solution 2.5 mg  2.5 mg Nebulization Q2H PRN Russella Dar, NP      . albuterol (PROVENTIL) (5 MG/ML) 0.5% nebulizer solution 2.5 mg  2.5 mg Nebulization Q6H Oniya Mandarino, MD   2.5 mg at 09/29/11 0836  . antiseptic oral rinse (BIOTENE) solution 15 mL  15 mL Mouth Rinse BID Lonia Blood, MD   15 mL at 09/29/11 1003  . aspirin chewable tablet 81 mg  81 mg Oral Daily Russella Dar, NP   81 mg at 09/29/11 1002  . beta carotene w/minerals (OCUVITE) tablet 1 tablet  1 tablet Oral Daily Russella Dar, NP   1 tablet at 09/29/11 1002  . demeclocycline (DECLOMYCIN) tablet 150 mg  150 mg Oral Q12H Crystle Carelli, MD   150 mg at 09/29/11 1002  . dextrose (GLUTOSE) 40 % oral gel 37.5 g  1 Tube Oral PRN Russella Dar, NP      . diltiazem (CARDIZEM CD) 24 hr capsule 180 mg  180 mg Oral Daily Russella Dar, NP   180 mg at 09/29/11 1002  . enoxaparin (LOVENOX) injection 40 mg  40 mg Subcutaneous Q24H Russella Dar, NP   40 mg at 09/29/11 0843  .  furosemide (LASIX) injection 20 mg  20 mg Intravenous Once Elier Zellars, MD   20 mg at 09/28/11 1307  . furosemide (LASIX) tablet 40 mg  40 mg Oral Daily Irish Piech, MD   40 mg at 09/29/11 1002  . hydrALAZINE (APRESOLINE) tablet 100 mg  100 mg Oral Q8H Calvert Cantor, MD   100 mg at 09/29/11 0644  . imipenem-cilastatin (PRIMAXIN) 500 mg in sodium chloride 0.9 % 100 mL IVPB  500 mg Intravenous Q8H Mckennon Zwart, MD   500 mg at 09/29/11 0843  . insulin aspart (novoLOG) injection 0-9 Units  0-9 Units Subcutaneous TID WC Caroline More, NP   2 Units at 09/29/11 [phone]  . insulin glargine (LANTUS) injection 12 Units  12 Units Subcutaneous Daily Rondall Radigan, MD   12 Units at 09/29/11 1002  . levothyroxine (SYNTHROID, LEVOTHROID) tablet 100 mcg  100 mcg Oral QAC breakfast Russella Dar, NP   100 mcg at 09/29/11 0843  . metoprolol (LOPRESSOR) tablet 50 mg  50 mg Oral BID Russella Dar, NP   50 mg at  09/29/11 1002  . ondansetron (ZOFRAN) injection 4 mg  4 mg Intravenous Q6H PRN Jahaira Earnhart, MD   4 mg at 09/26/11 2145  . pantoprazole (PROTONIX) EC tablet 40 mg  40 mg Oral Q1200 Russella Dar, NP   40 mg at 09/28/11 1309  . potassium chloride 20 MEQ/15ML (10%) liquid 20 mEq  20 mEq Oral Daily Irianna Gilday, MD   20 mEq at 09/29/11 1002  . sodium chloride 0.9 % injection 3 mL  3 mL Intravenous Q12H Russella Dar, NP   3 mL at 09/29/11 1002  . DISCONTD: insulin glargine (LANTUS) injection 10 Units  10 Units Subcutaneous Daily Jolinda Pinkstaff, MD   10 Units at 09/28/11 1046   No Known Allergies Principal Problem:  *SIRS (systemic inflammatory response syndrome) Active Problems:  Fever  Metabolic encephalopathy  COPD (chronic obstructive pulmonary disease)  Hypertension  Dysphagia  Hypothyroidism  CAD (coronary artery disease)  SIADH (syndrome of inappropriate ADH production)  Escherichia coli urinary tract infection  Hypoglycemia  Diastolic CHF, chronic-grade I  DM II (diabetes mellitus, type  II), controlled  Hypothermia  Bradycardia suspected related to hypothermia/SIRS  Dehydration  Acute respiratory failure with hypoxia  SVT (supraventricular tachycardia) likely secondary to beta blocker withdrawal and dehydration   Vital signs in last 24 hours: Temp:  [98 F (36.7 C)-99.1 F (37.3 C)] 98.7 F (37.1 C) (07/08 1009) Pulse Rate:  [60-74] 68  (07/08 1009) Resp:  [19-21] 19  (07/08 1009) BP: (116-164)/(43-71) 144/43 mmHg (07/08 1009) SpO2:  [93 %-100 %] 100 % (07/08 1009) Weight:  [82.963 kg (182 lb 14.4 oz)] 82.963 kg (182 lb 14.4 oz) (07/08 0630) Weight change: -0.635 kg (-1 lb 6.4 oz) Last BM Date: 09/27/11  Intake/Output from previous day: 07/07 0701 - 07/08 0700 In: 640 [P.O.:640] Out: 2825 [Urine:2825] Intake/Output this shift:    Lab Results:  Basename 09/28/11 0500  WBC 7.5  HGB 10.2*  HCT 31.7*  PLT 237   BMET  Basename 09/29/11 0505 09/28/11 0500  NA 135 137  K 4.5 3.8  CL 91* 94*  CO2 37* 36*  GLUCOSE 180* 173*  BUN 9 9  CREATININE 0.87 0.82  CALCIUM 8.5 8.8    Studies/Results: Dg Chest Port 1 View  09/27/2011  *RADIOLOGY REPORT*  Clinical Data: Shortness of breath.  PORTABLE CHEST - 1 VIEW 09/27/2011 1310 hours:  Comparison: Portable chest x-ray 09/22/2011, 08/30/2011, 08/18/2011, 06/21/2011.  Findings: Cardiac silhouette enlarged but stable.  Pulmonary venous hypertension with minimal interstitial pulmonary edema as evidenced by scattered Kerley B lines.  Small bilateral pleural effusions suspected.  Suboptimal inspiration accounts for mild bibasilar atelectasis.  No confluent airspace consolidation.  IMPRESSION: Stable cardiomegaly.  Minimal interstitial pulmonary edema and small bilateral pleural effusions consistent with minimal CHF.  Original Report Authenticated By: Arnell Sieving, M.D.    Medications: I have reviewed the patient's current medications.   Physical exam GENERAL- alert HEAD- normal atraumatic, no neck masses,  normal thyroid, no jvd RESPIRATORY- appears well, vitals normal, no respiratory distress, acyanotic, normal RR, ear and throat exam is normal, neck free of mass or lymphadenopathy, chest clear, no wheezing, crepitations, rhonchi, normal symmetric air entry CVS- regular rate and rhythm, S1, S2 normal, no murmur, click, rub or gallop ABDOMEN- abdomen is soft without significant tenderness, masses, organomegaly or guarding NEURO- Grossly normal EXTREMITIES- extremities normal, atraumatic, no cyanosis or edema  Plan  * Sepsis of a urinary source(from ESBL, possible acute pyelonephritis)- D6 Imipenem. Appreciate pharmacy.  Afebrile and hemodynamically stable.  * DM2- BP fluctuating. Appetite improving. Continue ssi. Low dose lantus, Continue gradual increase in lantus * Htn- generally controlled. Monitor on current meds.  * Hypothyroidism- on synthroid.  * GERD- on ppi.  * Pulmonary vascular congestion/grade 1 diastolic chf with acute congestion(bnp3000)- responded to lasix, continue.  * dvt prophylaxis  disposition eventually back to snf tomorrow if she continues to do well.        Livia Tarr 09/29/2011 12:05 PM Pager: [phone].

## 2011-09-29 NOTE — Progress Notes (Signed)
Clinical Social Work  Per MD, patient is not ready to dc today. CSW called SNF [facility]) and updated facility on [employer]. SNF is still agreeable to admission at dc. CSW will continue to follow.  [place], [place] [phone]

## 2011-09-30 ENCOUNTER — Inpatient Hospital Stay (HOSPITAL_COMMUNITY)
Admission: EM | Admit: 2011-09-30 | Discharge: 2011-10-06 | DRG: 871 | Disposition: A | Discharge status: Skilled Nursing Facility | Payer: Medicare Other | Attending: Internal Medicine | Admitting: Internal Medicine

## 2011-09-30 ENCOUNTER — Emergency Department (HOSPITAL_COMMUNITY): Payer: Medicare Other

## 2011-09-30 ENCOUNTER — Inpatient Hospital Stay (HOSPITAL_COMMUNITY): Payer: Medicare Other

## 2011-09-30 ENCOUNTER — Encounter (HOSPITAL_COMMUNITY): Payer: Self-pay | Admitting: *Deleted

## 2011-09-30 DIAGNOSIS — E039 Hypothyroidism, unspecified: Secondary | ICD-10-CM

## 2011-09-30 DIAGNOSIS — K5900 Constipation, unspecified: Secondary | ICD-10-CM

## 2011-09-30 DIAGNOSIS — N390 Urinary tract infection, site not specified: Secondary | ICD-10-CM | POA: Diagnosis present

## 2011-09-30 DIAGNOSIS — B9620 Unspecified Escherichia coli [E. coli] as the cause of diseases classified elsewhere: Secondary | ICD-10-CM

## 2011-09-30 DIAGNOSIS — R1011 Right upper quadrant pain: Secondary | ICD-10-CM

## 2011-09-30 DIAGNOSIS — I10 Essential (primary) hypertension: Secondary | ICD-10-CM

## 2011-09-30 DIAGNOSIS — J449 Chronic obstructive pulmonary disease, unspecified: Secondary | ICD-10-CM

## 2011-09-30 DIAGNOSIS — R112 Nausea with vomiting, unspecified: Secondary | ICD-10-CM | POA: Diagnosis present

## 2011-09-30 DIAGNOSIS — I5032 Chronic diastolic (congestive) heart failure: Secondary | ICD-10-CM

## 2011-09-30 DIAGNOSIS — E119 Type 2 diabetes mellitus without complications: Secondary | ICD-10-CM

## 2011-09-30 DIAGNOSIS — Z1612 Extended spectrum beta lactamase (ESBL) resistance: Secondary | ICD-10-CM | POA: Diagnosis present

## 2011-09-30 LAB — BASIC METABOLIC PANEL
Chloride: 90 mEq/L — ABNORMAL LOW (ref 96–112)
GFR calc Af Amer: 71 mL/min — ABNORMAL LOW (ref >90)
GFR calc non Af Amer: 61 mL/min — ABNORMAL LOW (ref >90)
Glucose, Bld: 251 mg/dL — ABNORMAL HIGH (ref 70–99)
Potassium: 3.5 mEq/L (ref 3.5–5.1)
Sodium: 135 mEq/L (ref 135–145)

## 2011-09-30 LAB — CBC WITH DIFFERENTIAL/PLATELET
Basophils Relative: 1 % (ref 0–1)
Eosinophils Absolute: 0.1 10*3/uL (ref 0.0–0.7)
Lymphs Abs: 1.3 10*3/uL (ref 0.7–4.0)
MCH: 29.9 pg (ref 26.0–34.0)
Neutro Abs: 7.8 10*3/uL — ABNORMAL HIGH (ref 1.7–7.7)
Neutrophils Relative %: 78 % — ABNORMAL HIGH (ref 43–77)
Platelets: 264 10*3/uL (ref 150–400)
RBC: 3.74 MIL/uL — ABNORMAL LOW (ref 3.87–5.11)

## 2011-09-30 LAB — URINALYSIS, ROUTINE W REFLEX MICROSCOPIC
Ketones, ur: NEGATIVE mg/dL
Nitrite: NEGATIVE
Protein, ur: NEGATIVE mg/dL
Urobilinogen, UA: 0.2 mg/dL (ref 0.0–1.0)

## 2011-09-30 LAB — POCT I-STAT TROPONIN I: Troponin i, poc: 0.01 ng/mL (ref 0.00–0.08)

## 2011-09-30 LAB — GLUCOSE, CAPILLARY
Glucose-Capillary: 151 mg/dL — ABNORMAL HIGH (ref 70–99)
Glucose-Capillary: 188 mg/dL — ABNORMAL HIGH (ref 70–99)

## 2011-09-30 MED ORDER — INSULIN GLARGINE 100 UNIT/ML ~~LOC~~ SOLN: 18.0000 [IU] | Freq: Every day | SUBCUTANEOUS | Status: DC

## 2011-09-30 MED ORDER — SULFAMETHOXAZOLE-TRIMETHOPRIM 400-80 MG PO TABS: 1.0000 | ORAL_TABLET | Freq: Two times a day (BID) | ORAL | Status: DC

## 2011-09-30 MED ORDER — SODIUM CHLORIDE 0.9 % IV SOLN
500.0000 mg | Freq: Once | INTRAVENOUS | Status: DC
  Filled 2011-09-30: qty 500

## 2011-09-30 MED ORDER — ONDANSETRON HCL 4 MG/2ML IJ SOLN
4.0000 mg | Freq: Once | INTRAMUSCULAR | Status: AC
  Administered 2011-09-30: 4 mg via INTRAVENOUS
  Filled 2011-09-30: qty 2

## 2011-09-30 MED ORDER — DEMECLOCYCLINE HCL 150 MG PO TABS: 300.0000 mg | ORAL_TABLET | Freq: Two times a day (BID) | ORAL | Status: AC

## 2011-09-30 NOTE — Progress Notes (Signed)
Nutrition Follow-up  Intervention:   1.  Supplements; Magic cup BID with lunch and dinner trays. 2.  Gastrointestinal;  Consider initiating bowel regimen if no BM today.  Pt's last BM was 7/6  Assessment:   Pt awakens to voice, however does not answer questions at this time.  Discussed intake with RN who reports intake is decreased.  Pt's daughter has been bringing some foods for pt, however her intake remains unchanged.  Ensure was d/c'd- reason unknown.  Pt's last BM was (7/6). Not currently on a bowel regimen.  Diet Order:  Dysphagia 3, thin  Meds: Scheduled Meds:   . albuterol  2.5 mg Nebulization Q6H  . antiseptic oral rinse  15 mL Mouth Rinse BID  . aspirin  81 mg Oral Daily  . beta carotene w/minerals  1 tablet Oral Daily  . demeclocycline  150 mg Oral Q12H  . diltiazem  180 mg Oral Daily  . enoxaparin  40 mg Subcutaneous Q24H  . furosemide  40 mg Oral Daily  . hydrALAZINE  100 mg Oral Q8H  . imipenem-cilastatin  500 mg Intravenous Q8H  . insulin aspart  0-9 Units Subcutaneous TID WC  . insulin glargine  14 Units Subcutaneous Daily  . levothyroxine  100 mcg Oral QAC breakfast  . metoprolol  50 mg Oral BID  . pantoprazole  40 mg Oral Q1200  . potassium chloride  20 mEq Oral Daily  . sodium chloride  3 mL Intravenous Q12H  . DISCONTD: insulin glargine  12 Units Subcutaneous Daily   Continuous Infusions:  PRN Meds:.acetaminophen, albuterol, dextrose, ondansetron (ZOFRAN) IV  Labs:  CMP     Component Value Date/Time   NA 135 09/29/2011 0505   K 4.5 09/29/2011 0505   CL 91* 09/29/2011 0505   CO2 37* 09/29/2011 0505   GLUCOSE 180* 09/29/2011 0505   BUN 9 09/29/2011 0505   CREATININE 0.87 09/29/2011 0505   CALCIUM 8.5 09/29/2011 0505   PROT 6.0 09/28/2011 0500   ALBUMIN 2.6* 09/28/2011 0500   AST 12 09/28/2011 0500   ALT 11 09/28/2011 0500   ALKPHOS 95 09/28/2011 0500   BILITOT 0.3 09/28/2011 0500   GFRNONAA 57* 09/29/2011 0505   GFRAA 66* 09/29/2011 0505   CBG (last 3)   Basename 09/30/11  0734 09/29/11 2145 09/29/11 1716  GLUCAP 188* 151* 177*    Intake/Output Summary (Last 24 hours) at 09/30/11 1108 Last data filed at 09/30/11 2956  Gross per 24 hour  Intake    120 ml  Output    250 ml  Net   -130 ml    Weight Status:  Stable at 182 lbs x1 week. Admission wt: 173 lbs  Restatement of needs:  1400-1600 kcal, 68-85g protein, 2 L daily  Nutrition Dx:  Biting/chewing difficulty, ongoing New nutrition dx:  Inadequate oral intake r/t dementia AEB PO averaging 30% of meals, supplements refused.  Monitor:   1.  Energy intake; pt meeting </= 90% of needs, Not met. Continue.  RD to add magic cups to trays BID 2.  Wt/wt change; monitor trends.  Met, continue. 3.  Gastrointestinal; consider bowel regimen if no BM today.   Loyce Dys, MS RD LDN Clinical Inpatient Dietitian Pager: [phone]

## 2011-09-30 NOTE — Progress Notes (Signed)
Clinical Social Work  CSW faxed dc summary and medications to SNF. SNF agreeable to admission. CSW informed patient and family at bedside of dc and all were agreeable. CSW coordinated transportation via [facility] at 1415 per RN request. CSW prepared dc packet and placed with shadow chart. CSW is signing off.  [place], [place] [phone]

## 2011-09-30 NOTE — Progress Notes (Signed)
Physical Therapy Treatment Patient Details Name: [name] MRN: [id number] DOB: 1918/05/27 Today's Date: 09/30/2011 Time: 4098-1191 PT Time Calculation (min): 21 min  PT Assessment / Plan / Recommendation Comments on Treatment Session  Again improved today, ambulating further. Likely to d/c SNf today.     Follow Up Recommendations  Skilled nursing facility    Barriers to Discharge        Equipment Recommendations  Defer to next venue    Recommendations for Other Services    Frequency     Plan Discharge plan remains appropriate;Frequency remains appropriate    Precautions / Restrictions Precautions Precautions: Fall       Mobility  Bed Mobility Supine to Sit: 3: Mod assist;HOB elevated Details for Bed Mobility Assistance: sequential facilitation and verbal cues to sit upright Transfers Transfers: Sit to Stand;Stand to Sit Sit to Stand: 1: +2 Total assist;From bed;With upper extremity assist Sit to Stand: Patient Percentage: 60% Stand to Sit: 2: Max assist;To chair/3-in-1;With upper extremity assist;With armrests Details for Transfer Assistance: facilitation for anterior translation of trunk over BOS, cues for anterior weight shift in standing, pt very rigidly extended at first but able to relax and bring weight to midline with mod facilitation Ambulation/Gait Ambulation/Gait Assistance: 3: Mod assist Ambulation Distance (Feet): 6 Feet Assistive device: Rolling walker Ambulation/Gait Assistance Details: facilitation for foward propulsion of RW as well as trunk flexion and anterior weight shift to prevent posterior lean; again as pt fatigues she stops and begins to extend rigidly throwing weight posteriorly needing maxA to flex and slowly lower to chair Gait Pattern: Narrow base of support;Shuffle;Decreased step length - left;Decreased step length - right    Exercises      PT Goals Acute Rehab PT Goals PT Goal: Supine/Side to Sit - Progress: Progressing toward  goal PT Goal: Sit to Supine/Side - Progress: Progressing toward goal PT Goal: Sit to Stand - Progress: Progressing toward goal PT Goal: Stand to Sit - Progress: Progressing toward goal PT Transfer Goal: Bed to Chair/Chair to Bed - Progress: Progressing toward goal Pt will Ambulate: 16 - 50 feet;with +2 total assist;with rolling walker PT Goal: Ambulate - Progress: Progressing toward goal  Visit Information  Last PT Received On: 09/30/11 Assistance Needed: +2    Subjective Data  Subjective: She seems more garbled with her speech today. (daughter) but pt able to tell me her name and birthday comprehensibly (RN and MD aware of daughter concerns)   Cognition  Overall Cognitive Status: History of cognitive impairments - at baseline Area of Impairment: Attention Arousal/Alertness: Awake/alert Current Attention Level: Sustained    Balance     End of Session PT - End of Session Equipment Utilized During Treatment: Gait belt Activity Tolerance: Patient tolerated treatment well;Patient limited by fatigue Patient left: in chair;with call bell/phone within reach;with family/visitor present Nurse Communication: Mobility status   GP     [facility] HELEN 09/30/2011, 3:11 PM

## 2011-09-30 NOTE — Progress Notes (Signed)
ANTIBIOTIC CONSULT NOTE - FOLLOW UP  Pharmacy Consult for Primaxin Indication: UTI  No Known Allergies  Patient Measurements: Height: 5\' 9"  (175.3 cm) Weight: 182 lb 14.4 oz (82.963 kg) IBW/kg (Calculated) : 66.2   Vital Signs: Temp: 97.9 F (36.6 C) (07/09 0500) Temp src: Oral (07/09 0500) BP: 160/57 mmHg (07/09 0500) Pulse Rate: 74  (07/09 0500) Intake/Output from previous day: 07/08 0701 - 07/09 0700 In: -  Out: 250 [Urine:250] Intake/Output from this shift: Total I/O In: 120 [P.O.:120] Out: -   Labs:  Basename 09/29/11 0505 09/28/11 0500  WBC -- 7.5  HGB -- 10.2*  PLT -- 237  LABCREA -- --  CREATININE 0.87 0.82   Estimated Creatinine Clearance: 49.5 ml/min (by C-G formula based on Cr of 0.87). No results found for this basename: VANCOTROUGH:2,VANCOPEAK:2,VANCORANDOM:2,GENTTROUGH:2,GENTPEAK:2,GENTRANDOM:2,TOBRATROUGH:2,TOBRAPEAK:2,TOBRARND:2,AMIKACINPEAK:2,AMIKACINTROU:2,AMIKACIN:2, in the last 72 hours   Microbiology: Recent Results (from the past 720 hour(s))  URINE CULTURE     Status: Normal   Collection Time   09/04/11  3:16 AM      Component Value Range Status Comment   Specimen Description URINE, CATHETERIZED   Final    Special Requests NONE   Final    Culture  Setup Time [id number]   Final    Colony Count NO GROWTH   Final    Culture NO GROWTH   Final    Report Status 09/05/2011 FINAL   Final   URINE CULTURE     Status: Normal   Collection Time   09/22/11  6:12 AM      Component Value Range Status Comment   Specimen Description URINE, CATHETERIZED   Final    Special Requests NONE   Final    Culture  Setup Time 09/22/2011 12:53   Final    Colony Count >=100,000 COLONIES/ML   Final    Culture     Final    Value: ESCHERICHIA COLI     Note: Confirmed Extended Spectrum Beta-Lactamase Producer (ESBL) CRITICAL RESULT CALLED TO, READ BACK BY AND VERIFIED WITH: ZENEIDA@12 :40PM ON 09/24/11 BY DANTS   Report Status 09/24/2011 FINAL   Final    Organism ID,  Bacteria ESCHERICHIA COLI   Final   MRSA PCR SCREENING     Status: Normal   Collection Time   09/22/11  8:43 AM      Component Value Range Status Comment   MRSA by PCR NEGATIVE  NEGATIVE Final   CULTURE, BLOOD (ROUTINE X 2)     Status: Normal   Collection Time   09/22/11 10:04 AM      Component Value Range Status Comment   Specimen Description BLOOD LEFT ARM   Final    Special Requests BOTTLES DRAWN AEROBIC ONLY 10CC   Final    Culture  Setup Time 09/23/2011 02:12   Final    Culture NO GROWTH 5 DAYS   Final    Report Status 09/29/2011 FINAL   Final   CULTURE, BLOOD (ROUTINE X 2)     Status: Normal   Collection Time   09/22/11 10:13 AM      Component Value Range Status Comment   Specimen Description BLOOD LEFT HAND   Final    Special Requests BOTTLES DRAWN AEROBIC ONLY [facility]   Final    Culture  Setup Time 09/23/2011 02:12   Final    Culture NO GROWTH 5 DAYS   Final    Report Status 09/29/2011 FINAL   Final     Anti-infectives     Start  Dose/Rate Route Frequency Ordered Stop   09/27/11 2100   demeclocycline (DECLOMYCIN) tablet 150 mg        150 mg Oral Every 12 hours 09/27/11 1203     09/24/11 1600   imipenem-cilastatin (PRIMAXIN) 500 mg in sodium chloride 0.9 % 100 mL IVPB        500 mg 200 mL/hr over 30 Minutes Intravenous Every 8 hours 09/24/11 1549     09/23/11 0700   cefTRIAXone (ROCEPHIN) 1 g in dextrose 5 % 50 mL IVPB  Status:  Discontinued        1 g 100 mL/hr over 30 Minutes Intravenous Every 24 hours 09/22/11 0824 09/24/11 1532   09/22/11 0900   demeclocycline (DECLOMYCIN) tablet 300 mg  Status:  Discontinued        300 mg Oral Every 12 hours 09/22/11 0733 09/27/11 1203   09/22/11 0745   cefTRIAXone (ROCEPHIN) 1 g in dextrose 5 % 50 mL IVPB  Status:  Discontinued        1 g 100 mL/hr over 30 Minutes Intravenous Every 24 hours 09/22/11 0741 09/22/11 0824   09/22/11 0645   cefTRIAXone (ROCEPHIN) 1 g in dextrose 5 % 50 mL IVPB        1 g 100 mL/hr over 30 Minutes  Intravenous  Once 09/22/11 0638 09/22/11 0729          Assessment: 89 yof on day # 7 of primaxin for ESBL e. Coli UTI. Pt is afebrile and last WBC was WNL. Estimated CrCl ~52ml/min so dose is appropriate.   Plan:  1. Continue primaxin 500mg  IV Q8H for now 2. Clarify length of therapy 3. Monitor renal function and adjust as appropriate   Kyliegh Jester, Drake Leach 09/30/2011,10:17 AM

## 2011-09-30 NOTE — Progress Notes (Signed)
Pt discharged in stable condition via EMS to [facility].  Discharge packet sent with pt.  [name]

## 2011-09-30 NOTE — Progress Notes (Signed)
UR completed.   Pt discussed in hospital LOS meeting today.

## 2011-09-30 NOTE — Discharge Summary (Signed)
DISCHARGE SUMMARY  [name]  MR#: [id number]  DOB:1919/02/18  Date of Admission: 09/22/2011 Date of Discharge: 09/30/2011  Attending Physician:Theoren Palka  Patient's WUJ:[id number], Roberta Beets, MD  Consults: none  Discharge Diagnoses: Present on Admission:  .Diastolic CHF, chronic-grade I .DM II (diabetes mellitus, type II), controlled .SIADH (syndrome of inappropriate ADH production) .Escherichia coli urinary tract infection .Metabolic encephalopathy .Hypertension .Fever .Dysphagia .COPD (chronic obstructive pulmonary disease) .CAD (coronary artery disease) .Hypothyroidism .Hypothermia .Bradycardia suspected related to hypothermia/SIRS .SIRS (systemic inflammatory response syndrome) .Dehydration .Acute respiratory failure with hypoxia .Hypoglycemia .Infection due to ESBL-producing Escherichia coli  Hospital Course: This lovely elderly lady was admitted with altered mental status from snf. She was found to be septic with hypothermia/hypoglycemia/hypotension. Septic work up later confirmed ESBL E.coli uti as the source of the sepsis. She hence received 7 days of imipenem, and will complete 3 more days of bactrim. She has done relatively well, with resolution of the sepsis/hypoglycemia/hypothermia. She remains somewhat lethargic, but I anticipate that she will continue to experience deconditioning with recurrent events(hospitalized 3 times already this year). She will return to her snf. Her daughter [name] has been intimately involved in her care.   Medication List  As of 09/30/2011 12:41 PM   TAKE these medications         acetaminophen 325 MG tablet   Commonly known as: TYLENOL   Take 650 mg by mouth every 4 (four) hours as needed. For pain.      albuterol (5 MG/ML) 0.5% nebulizer solution   Commonly known as: PROVENTIL   Take 0.5 mLs (2.5 mg total) by nebulization every 2 (two) hours as needed for wheezing or shortness of breath.      aspirin 81 MG chewable tablet   Chew  81 mg by mouth daily.      beta carotene w/minerals tablet   Take 1 tablet by mouth daily.      bisacodyl 10 MG suppository   Commonly known as: DULCOLAX   Place 10 mg rectally daily as needed. For constipation      demeclocycline 150 MG tablet   Commonly known as: DECLOMYCIN   Take 2 tablets (300 mg total) by mouth 2 (two) times daily.      diltiazem 180 MG 24 hr capsule   Commonly known as: CARDIZEM CD   Take 180 mg by mouth daily.      feeding supplement Liqd   Take 237 mLs by mouth 3 (three) times daily between meals.      furosemide 40 MG tablet   Commonly known as: LASIX   Take 1 tablet (40 mg total) by mouth daily.      GLUCAGON EMERGENCY 1 MG injection   Generic drug: glucagon   Inject 1 mg into the vein once as needed. 1 mg injection IM      hydrALAZINE 100 MG tablet   Commonly known as: APRESOLINE   Take 1 tablet (100 mg total) by mouth every 8 (eight) hours.      insulin aspart 100 UNIT/ML injection   Commonly known as: novoLOG   Inject 4 Units into the skin 3 (three) times daily with meals.      insulin glargine 100 UNIT/ML injection   Commonly known as: LANTUS   Inject 18 Units into the skin at bedtime.      ipratropium 0.02 % nebulizer solution   Commonly known as: ATROVENT   Take 0.5 mg by nebulization every 2 (two) hours as needed. For shortness of breath.  lactulose 10 GM/15ML solution   Commonly known as: CHRONULAC   Take 20 g by mouth 2 (two) times daily.      levothyroxine 100 MCG tablet   Commonly known as: SYNTHROID, LEVOTHROID   Take 100 mcg by mouth daily before breakfast.      LOPRESSOR 50 MG tablet   Generic drug: metoprolol   Take 50 mg by mouth 2 (two) times daily.      omeprazole 20 MG capsule   Commonly known as: PRILOSEC   Take 20 mg by mouth 2 (two) times daily.      polyethylene glycol packet   Commonly known as: MIRALAX / GLYCOLAX   Take 17 g by mouth daily.      potassium chloride 10 MEQ tablet   Commonly known  as: K-DUR   Take 10 mEq by mouth daily.      senna-docusate 8.6-50 MG per tablet   Commonly known as: Senokot-S   Take 1 tablet by mouth 2 (two) times daily.      simvastatin 20 MG tablet   Commonly known as: ZOCOR   Take 20 mg by mouth daily.      sulfamethoxazole-trimethoprim 400-80 MG per tablet   Commonly known as: BACTRIM,SEPTRA   Take 1 tablet by mouth 2 (two) times daily.             Day of Discharge BP 160/57  Pulse 74  Temp 97.9 F (36.6 C) (Oral)  Resp 20  Ht 5\' 9"  (1.753 m)  Wt 82.963 kg (182 lb 14.4 oz)  BMI 27.01 kg/m2  SpO2 97%  Physical Exam: Comfortable at rest.  Results for orders placed during the hospital encounter of 09/22/11 (from the past 24 hour(s))  GLUCOSE, CAPILLARY     Status: Abnormal   Collection Time   09/29/11  5:16 PM      Component Value Range   Glucose-Capillary 177 (*) 70 - 99 mg/dL  GLUCOSE, CAPILLARY     Status: Abnormal   Collection Time   09/29/11  9:45 PM      Component Value Range   Glucose-Capillary 151 (*) 70 - 99 mg/dL  GLUCOSE, CAPILLARY     Status: Abnormal   Collection Time   09/30/11  7:34 AM      Component Value Range   Glucose-Capillary 188 (*) 70 - 99 mg/dL   Comment 1 Notify RN    GLUCOSE, CAPILLARY     Status: Abnormal   Collection Time   09/30/11 12:02 PM      Component Value Range   Glucose-Capillary 248 (*) 70 - 99 mg/dL   Comment 1 Notify RN      Disposition: snf today.   Follow-up Appts: Discharge Orders    Future Orders Please Complete By Expires   Diet - low sodium heart healthy      Increase activity slowly           Tests Needing Follow-up: Bmp in 3 days.  Time spent in discharge (includes decision making & examination of pt): 35 minutes  Signed: Dionne Rossa 09/30/2011, 12:41 PM

## 2011-09-30 NOTE — ED Notes (Signed)
Provided pt with ice chips.  Pt and family aware of plan to admit.

## 2011-09-30 NOTE — ED Provider Notes (Signed)
History     CSN: [id number]  Arrival date & time 09/30/11  1627   First MD Initiated Contact with Patient 09/30/11 1630      Chief Complaint  Patient presents with  . Epistaxis    (Consider location/radiation/quality/duration/timing/severity/associated sxs/prior treatment) Patient is a 78 y.o. female presenting with vomiting and general illness. The history is provided by the EMS personnel and a relative.  Emesis  This is a new problem. The current episode started less than 1 hour ago. The problem occurs 2 to 4 times per day. The problem has not changed since onset.The emesis has an appearance of stomach contents. There has been no fever. Pertinent negatives include no abdominal pain, no chills, no cough, no diarrhea, no fever and no headaches. Risk factors: recent admission to hospital.  Illness  The current episode started today. The problem occurs frequently. The problem has been gradually worsening. The problem is moderate. Nothing relieves the symptoms. Nothing aggravates the symptoms. Associated symptoms include nausea and vomiting. Pertinent negatives include no fever, no abdominal pain, no diarrhea, no congestion, no ear pain, no headaches, no mouth sores, no sore throat, no neck pain, no cough, no wheezing and no rash.    Past Medical History  Diagnosis Date  . Dysphagia   . Diabetes mellitus   . Hypertension   . Hypothyroidism   . Hyperlipemia   . GERD (gastroesophageal reflux disease)   . Depression   . Anxiety   . Acute MI   . Pneumonia   . Stroke   . UTI (urinary tract infection)   . COPD (chronic obstructive pulmonary disease)   . CAD (coronary artery disease)   . Late effects of CVA (cerebrovascular accident)   . DEMENTIA   . Leukocytosis   . Anemia   . Hypokalemia   . Paralytic ileus   . Pleural effusion     Past Surgical History  Procedure Date  . Back surgery     Family History  Problem Relation Age of Onset  . Hypertension Sister   .  Hypertension Brother     History  Substance Use Topics  . Smoking status: Never Smoker   . Smokeless tobacco: Never Used  . Alcohol Use: No    OB History    Grav Para Term Preterm Abortions TAB SAB Ect Mult Living                  Review of Systems  Constitutional: Negative for fever, chills, diaphoresis and fatigue.  HENT: Negative for ear pain, congestion, sore throat, facial swelling, mouth sores, trouble swallowing, neck pain and neck stiffness.   Eyes: Negative.   Respiratory: Negative for apnea, cough, chest tightness, shortness of breath and wheezing.   Cardiovascular: Negative for chest pain, palpitations and leg swelling.  Gastrointestinal: Positive for nausea and vomiting. Negative for abdominal pain, diarrhea and abdominal distention.  Genitourinary: Negative for hematuria, flank pain, vaginal discharge, difficulty urinating and menstrual problem.  Musculoskeletal: Negative for back pain and gait problem.  Skin: Negative for rash and wound.  Neurological: Negative for dizziness, tremors, seizures, syncope, facial asymmetry, numbness and headaches.  Psychiatric/Behavioral: Negative.   All other systems reviewed and are negative.    Allergies  Review of patient's allergies indicates no known allergies.  Home Medications   Current Outpatient Rx  Name Route Sig Dispense Refill  . ACETAMINOPHEN 325 MG PO TABS Oral Take 650 mg by mouth every 4 (four) hours as needed. For pain.    Marland Kitchen  ALBUTEROL SULFATE (5 MG/ML) 0.5% IN NEBU Nebulization Take 0.5 mLs (2.5 mg total) by nebulization every 2 (two) hours as needed for wheezing or shortness of breath. 20 mL   . ASPIRIN 81 MG PO CHEW Oral Chew 81 mg by mouth daily.    . OCUVITE PO TABS Oral Take 1 tablet by mouth daily.      Marland Kitchen BISACODYL 10 MG RE SUPP Rectal Place 10 mg rectally daily as needed. For constipation    . DEMECLOCYCLINE HCL 150 MG PO TABS Oral Take 2 tablets (300 mg total) by mouth 2 (two) times daily. 20 tablet 0    . DILTIAZEM HCL ER COATED BEADS 180 MG PO CP24 Oral Take 180 mg by mouth daily.    Marland Kitchen ENSURE COMPLETE PO LIQD Oral Take 237 mLs by mouth 3 (three) times daily between meals.    . FUROSEMIDE 40 MG PO TABS Oral Take 1 tablet (40 mg total) by mouth daily. 30 tablet   . GLUCAGON (RDNA) 1 MG IJ KIT Intravenous Inject 1 mg into the vein once as needed. 1 mg injection IM    . HYDRALAZINE HCL 100 MG PO TABS Oral Take 1 tablet (100 mg total) by mouth every 8 (eight) hours.    . INSULIN ASPART 100 UNIT/ML [place] SOLN Subcutaneous Inject 4 Units into the skin 3 (three) times daily with meals.     . INSULIN GLARGINE 100 UNIT/ML [place] SOLN Subcutaneous Inject 18 Units into the skin at bedtime. 10 mL 0  . IPRATROPIUM BROMIDE 0.02 % IN SOLN Nebulization Take 0.5 mg by nebulization every 2 (two) hours as needed. For shortness of breath.    Marland Kitchen LACTULOSE 10 GM/15ML PO SOLN Oral Take 20 g by mouth 2 (two) times daily.    Marland Kitchen LEVOTHYROXINE SODIUM 100 MCG PO TABS Oral Take 100 mcg by mouth daily before breakfast.    . METOPROLOL TARTRATE 50 MG PO TABS Oral Take 50 mg by mouth 2 (two) times daily.      Marland Kitchen OMEPRAZOLE 20 MG PO CPDR Oral Take 20 mg by mouth 2 (two) times daily.      Marland Kitchen POLYETHYLENE GLYCOL 3350 PO PACK Oral Take 17 g by mouth daily.      Marland Kitchen POTASSIUM CHLORIDE ER 10 MEQ PO TBCR Oral Take 10 mEq by mouth daily.    Bernadette Hoit SODIUM 8.6-50 MG PO TABS Oral Take 1 tablet by mouth 2 (two) times daily.    Marland Kitchen SIMVASTATIN 20 MG PO TABS Oral Take 20 mg by mouth daily.      . SULFAMETHOXAZOLE-TRIMETHOPRIM 400-80 MG PO TABS Oral Take 1 tablet by mouth 2 (two) times daily. 6 tablet 0    BP 166/58  Pulse 68  Temp 98.6 F (37 C) (Oral)  Resp 18  SpO2 97%  Physical Exam  Nursing note and vitals reviewed. Constitutional: She appears well-developed and well-nourished. No distress.  HENT:  Head: Normocephalic and atraumatic.  Right Ear: External ear normal.  Left Ear: External ear normal.  Nose: Nose normal.   Mouth/Throat: Oropharynx is clear and moist. No oropharyngeal exudate.  Eyes: Conjunctivae and EOM are normal. Pupils are equal, round, and reactive to light. Right eye exhibits no discharge. Left eye exhibits no discharge.  Neck: Normal range of motion. Neck supple. No JVD present. No tracheal deviation present. No thyromegaly present.  Cardiovascular: Normal rate, regular rhythm, normal heart sounds and intact distal pulses.  Exam reveals no gallop and no friction rub.   No  murmur heard. Pulmonary/Chest: Effort normal and breath sounds normal. No respiratory distress. She has no wheezes. She has no rales. She exhibits no tenderness.  Abdominal: Soft. Bowel sounds are normal. She exhibits no distension. There is no tenderness. There is no rebound and no guarding.  Musculoskeletal: Normal range of motion.  Lymphadenopathy:    She has no cervical adenopathy.  Neurological: She is alert. No cranial nerve deficit. Coordination normal.       Patient oriented to self which is her baseline according to son who is at bedside.  Skin: Skin is warm. No rash noted. She is not diaphoretic.  Psychiatric: She has a normal mood and affect. Her behavior is normal. Judgment and thought content normal.    ED Course  Procedures (including critical care time)  Labs Reviewed  CBC WITH DIFFERENTIAL - Abnormal; Notable for the following:    RBC 3.74 (*)     Hemoglobin 11.2 (*)     HCT 34.7 (*)     Neutrophils Relative 78 (*)     Neutro Abs 7.8 (*)     All other components within normal limits  POCT I-STAT TROPONIN I  BASIC METABOLIC PANEL  LACTIC ACID, PLASMA   No results found.   No diagnosis found.    MDM  78 year old female patient with past medical history of diabetes hypertension hyperlipidemia coronary artery disease recurrent UTIs COPD presents immediately after being discharged from the hospital. Patient was recently admitted for sepsis like vital sign changes in connection with the urinary  tract infection. Patient was on her way back to her nursing home after getting treated as an inpatient with imipenem for urinary tract infection with ESBL Escherichia coli. Patient was improving and this was discharged home. On her way home via EMS patient had nausea and 2 episodes of emesis. They noticed her blood pressure was rising. He also noticed that there was some pink fluid coming out of her nose. Patient did not have a brisk nosebleed but simply some pink fluid coming out of her nose after vomiting. On my evaluation patient did not appear to have any blood in her nares had a normal HEENT exam otherwise. Given nausea and no appearance the patient concern for possible nosocomial infection from previous admission. Will get EKG chest x-ray labs and urine labs to screen for what could be causing her symptoms.  Results for orders placed during the hospital encounter of 09/30/11  CBC WITH DIFFERENTIAL      Component Value Range   WBC 10.1  4.0 - 10.5 K/uL   RBC 3.74 (*) 3.87 - 5.11 MIL/uL   Hemoglobin 11.2 (*) 12.0 - 15.0 g/dL   HCT 16.1 (*) 09.6 - 04.5 %   MCV 92.8  78.0 - 100.0 fL   MCH 29.9  26.0 - 34.0 pg   MCHC 32.3  30.0 - 36.0 g/dL   RDW 40.9  81.1 - 91.4 %   Platelets 264  150 - 400 K/uL   Neutrophils Relative 78 (*) 43 - 77 %   Neutro Abs 7.8 (*) 1.7 - 7.7 K/uL   Lymphocytes Relative 13  12 - 46 %   Lymphs Abs 1.3  0.7 - 4.0 K/uL   Monocytes Relative 7  3 - 12 %   Monocytes Absolute 0.7  0.1 - 1.0 K/uL   Eosinophils Relative 1  0 - 5 %   Eosinophils Absolute 0.1  0.0 - 0.7 K/uL   Basophils Relative 1  0 - 1 %  Basophils Absolute 0.1  0.0 - 0.1 K/uL  BASIC METABOLIC PANEL      Component Value Range   Sodium 135  135 - 145 mEq/L   Potassium 3.5  3.5 - 5.1 mEq/L   Chloride 90 (*) 96 - 112 mEq/L   CO2 35 (*) 19 - 32 mEq/L   Glucose, Bld 251 (*) 70 - 99 mg/dL   BUN 11  6 - 23 mg/dL   Creatinine, Ser 1.61  0.50 - 1.10 mg/dL   Calcium 8.7  8.4 - 09.6 mg/dL   GFR calc non Af  Amer 61 (*) >90 mL/min   GFR calc Af Amer 71 (*) >90 mL/min  LACTIC ACID, PLASMA      Component Value Range   Lactic Acid, Venous 1.9  0.5 - 2.2 mmol/L  POCT I-STAT TROPONIN I      Component Value Range   Troponin i, poc 0.01  0.00 - 0.08 ng/mL   Comment 3           URINALYSIS, ROUTINE W REFLEX MICROSCOPIC      Component Value Range   Color, Urine YELLOW  YELLOW   APPearance CLEAR  CLEAR   Specific Gravity, Urine 1.012  1.005 - 1.030   pH 6.0  5.0 - 8.0   Glucose, UA NEGATIVE  NEGATIVE mg/dL   Hgb urine dipstick NEGATIVE  NEGATIVE   Bilirubin Urine SMALL (*) NEGATIVE   Ketones, ur NEGATIVE  NEGATIVE mg/dL   Protein, ur NEGATIVE  NEGATIVE mg/dL   Urobilinogen, UA 0.2  0.0 - 1.0 mg/dL   Nitrite NEGATIVE  NEGATIVE   Leukocytes, UA MODERATE (*) NEGATIVE  URINE MICROSCOPIC-ADD ON      Component Value Range   Squamous Epithelial / LPF RARE  RARE   WBC, UA 7-10  <3 WBC/hpf   Casts HYALINE CASTS (*) NEGATIVE   Urine-Other MUCOUS PRESENT    HEPATIC FUNCTION PANEL      Component Value Range   Total Protein 6.5  6.0 - 8.3 g/dL   Albumin 2.9 (*) 3.5 - 5.2 g/dL   AST 23  0 - 37 U/L   ALT 13  0 - 35 U/L   Alkaline Phosphatase 106  39 - 117 U/L   Total Bilirubin 0.2 (*) 0.3 - 1.2 mg/dL   Bilirubin, Direct <0.4  0.0 - 0.3 mg/dL   Indirect Bilirubin NOT CALCULATED  0.3 - 0.9 mg/dL  LIPASE, BLOOD      Component Value Range   Lipase 27  11 - 59 U/L   DG Abd 1 View (Final result)   Result time:09/30/11 2301    Final result by Rad Results In Interface (09/30/11 23:01:45)    Narrative:   *RADIOLOGY REPORT*  Clinical Data: Epistaxis  ABDOMEN - 1 VIEW  Comparison: 09/26/2011  Findings: Generalized gaseous distention of small and large bowel is present. Calcified uterine fibroids project over the pelvis. Osteopenia. Degenerative change of the hip joints right greater than left. No obvious free intraperitoneal gas.  IMPRESSION: Mild generalized bowel distention.  Original Report  Authenticated By: Donavan Burnet, M.D.            [facility] Chest Port 1 View (Final result)   Result time:09/30/11 [phone]    Final result by Rad Results In Interface (09/30/11 17:52:14)    Narrative:   *RADIOLOGY REPORT*  Clinical Data: Hypertension. Epistaxis.  PORTABLE CHEST - 1 VIEW  Comparison: 09/27/2011  Findings: Moderate cardiomegaly. Under aerated lungs. Left basilar atelectasis. No  pneumothorax.  IMPRESSION: Cardiomegaly. Left basilar atelectasis.  Original Report Authenticated By: Donavan Burnet, M.D.    Date: 10/01/2011  Rate: 60  Rhythm: normal sinus rhythm  QRS Axis: normal  Intervals: normal  ST/T Wave abnormalities: normal  Conduction Disutrbances:none  Narrative Interpretation: LVH  Old EKG Reviewed: unchanged    Patient apparently has continued urinary tract infection and inability to tolerate by mouth social be admitted to the hospitalist team has otherwise remained hemodynamically stable  Case discussed with Dr.Belfi        Sherryl Manges, MD 10/01/11 [phone]

## 2011-09-30 NOTE — ED Notes (Signed)
MD at bedside. 

## 2011-09-30 NOTE — ED Notes (Signed)
Per PTAR- pt just d/c'ed from hospital 6N. Pt was in route to [facility]- began to have nosebleed, nausea, hypertension. PTAR returned with pt to ED. BP 200/70 HR 66. Alert and oriented x 3. No pain. Bleeding controlled on arrival.

## 2011-10-01 ENCOUNTER — Inpatient Hospital Stay (HOSPITAL_COMMUNITY): Payer: Medicare Other

## 2011-10-01 DIAGNOSIS — I10 Essential (primary) hypertension: Secondary | ICD-10-CM

## 2011-10-01 DIAGNOSIS — E119 Type 2 diabetes mellitus without complications: Secondary | ICD-10-CM

## 2011-10-01 DIAGNOSIS — N390 Urinary tract infection, site not specified: Secondary | ICD-10-CM

## 2011-10-01 DIAGNOSIS — R112 Nausea with vomiting, unspecified: Secondary | ICD-10-CM

## 2011-10-01 LAB — COMPREHENSIVE METABOLIC PANEL
ALT: 10 U/L (ref 0–35)
Alkaline Phosphatase: 94 U/L (ref 39–117)
Chloride: 91 mEq/L — ABNORMAL LOW (ref 96–112)
GFR calc Af Amer: 72 mL/min — ABNORMAL LOW (ref >90)
Glucose, Bld: 189 mg/dL — ABNORMAL HIGH (ref 70–99)
Potassium: 3.3 mEq/L — ABNORMAL LOW (ref 3.5–5.1)
Sodium: 137 mEq/L (ref 135–145)
Total Protein: 5.7 g/dL — ABNORMAL LOW (ref 6.0–8.3)

## 2011-10-01 LAB — GLUCOSE, CAPILLARY
Glucose-Capillary: 201 mg/dL — ABNORMAL HIGH (ref 70–99)
Glucose-Capillary: 207 mg/dL — ABNORMAL HIGH (ref 70–99)
Glucose-Capillary: 217 mg/dL — ABNORMAL HIGH (ref 70–99)
Glucose-Capillary: 218 mg/dL — ABNORMAL HIGH (ref 70–99)

## 2011-10-01 LAB — CBC WITH DIFFERENTIAL/PLATELET
Basophils Absolute: 0 10*3/uL (ref 0.0–0.1)
Basophils Relative: 0 % (ref 0–1)
Eosinophils Relative: 1 % (ref 0–5)
HCT: 32.5 % — ABNORMAL LOW (ref 36.0–46.0)
MCHC: 32 g/dL (ref 30.0–36.0)
MCV: 92.9 fL (ref 78.0–100.0)
Monocytes Absolute: 0.6 10*3/uL (ref 0.1–1.0)
Neutro Abs: 5.9 10*3/uL (ref 1.7–7.7)
RDW: 14.6 % (ref 11.5–15.5)

## 2011-10-01 LAB — HEPATIC FUNCTION PANEL
ALT: 13 U/L (ref 0–35)
AST: 23 U/L (ref 0–37)
Albumin: 2.9 g/dL — ABNORMAL LOW (ref 3.5–5.2)
Alkaline Phosphatase: 106 U/L (ref 39–117)
Total Bilirubin: 0.2 mg/dL — ABNORMAL LOW (ref 0.3–1.2)

## 2011-10-01 MED ORDER — ENSURE COMPLETE PO LIQD: 237.0000 mL | Freq: Three times a day (TID) | ORAL | Status: DC

## 2011-10-01 MED ORDER — PANTOPRAZOLE SODIUM 40 MG PO TBEC
40.0000 mg | DELAYED_RELEASE_TABLET | Freq: Every day | ORAL | Status: DC
  Administered 2011-10-01: 40 mg via ORAL
  Filled 2011-10-01: qty 1

## 2011-10-01 MED ORDER — DILTIAZEM HCL ER COATED BEADS 180 MG PO CP24
180.0000 mg | ORAL_CAPSULE | Freq: Every day | ORAL | Status: DC
  Administered 2011-10-01 – 2011-10-06 (×6): 180 mg via ORAL
  Filled 2011-10-01 (×6): qty 1

## 2011-10-01 MED ORDER — FUROSEMIDE 40 MG PO TABS
40.0000 mg | ORAL_TABLET | Freq: Every day | ORAL | Status: DC
  Administered 2011-10-01 – 2011-10-02 (×2): 40 mg via ORAL
  Filled 2011-10-01 (×2): qty 1

## 2011-10-01 MED ORDER — LEVOTHYROXINE SODIUM 100 MCG PO TABS
100.0000 ug | ORAL_TABLET | Freq: Every day | ORAL | Status: DC
  Administered 2011-10-01 – 2011-10-06 (×6): 100 ug via ORAL
  Filled 2011-10-01 (×8): qty 1

## 2011-10-01 MED ORDER — INSULIN ASPART 100 UNIT/ML ~~LOC~~ SOLN
0.0000 [IU] | Freq: Three times a day (TID) | SUBCUTANEOUS | Status: DC
  Administered 2011-10-01 – 2011-10-02 (×6): 3 [IU] via SUBCUTANEOUS
  Administered 2011-10-03: 9 [IU] via SUBCUTANEOUS
  Administered 2011-10-03 (×2): 5 [IU] via SUBCUTANEOUS
  Administered 2011-10-04: 3 [IU] via SUBCUTANEOUS
  Administered 2011-10-04: 7 [IU] via SUBCUTANEOUS
  Administered 2011-10-04 – 2011-10-05 (×3): 3 [IU] via SUBCUTANEOUS
  Administered 2011-10-05: 5 [IU] via SUBCUTANEOUS
  Administered 2011-10-06: 3 [IU] via SUBCUTANEOUS

## 2011-10-01 MED ORDER — HYDRALAZINE HCL 20 MG/ML IJ SOLN
10.0000 mg | INTRAMUSCULAR | Status: DC | PRN
  Administered 2011-10-01: 10 mg via INTRAVENOUS
  Filled 2011-10-01 (×2): qty 0.5

## 2011-10-01 MED ORDER — ASPIRIN 81 MG PO CHEW
81.0000 mg | CHEWABLE_TABLET | Freq: Every day | ORAL | Status: DC
  Administered 2011-10-01 – 2011-10-06 (×6): 81 mg via ORAL
  Filled 2011-10-01 (×6): qty 1

## 2011-10-01 MED ORDER — SODIUM CHLORIDE 0.9 % IJ SOLN
3.0000 mL | Freq: Two times a day (BID) | INTRAMUSCULAR | Status: DC
  Administered 2011-10-01 (×2): 3 mL via INTRAVENOUS

## 2011-10-01 MED ORDER — ONDANSETRON HCL 4 MG/2ML IJ SOLN: 4.0000 mg | Freq: Four times a day (QID) | INTRAMUSCULAR | Status: DC | PRN

## 2011-10-01 MED ORDER — ALBUTEROL SULFATE (5 MG/ML) 0.5% IN NEBU
2.5000 mg | INHALATION_SOLUTION | RESPIRATORY_TRACT | Status: DC | PRN
Start: 2011-10-01 — End: 2011-10-06

## 2011-10-01 MED ORDER — GLUCAGON (RDNA) 1 MG IJ KIT: 1.0000 mg | PACK | Freq: Once | INTRAMUSCULAR | Status: AC | PRN

## 2011-10-01 MED ORDER — HYDRALAZINE HCL 50 MG PO TABS
100.0000 mg | ORAL_TABLET | Freq: Three times a day (TID) | ORAL | Status: DC
  Administered 2011-10-01 – 2011-10-06 (×16): 100 mg via ORAL
  Filled 2011-10-01 (×22): qty 2

## 2011-10-01 MED ORDER — POLYETHYLENE GLYCOL 3350 17 G PO PACK
17.0000 g | PACK | Freq: Two times a day (BID) | ORAL | Status: DC
  Administered 2011-10-01 – 2011-10-06 (×11): 17 g via ORAL
  Filled 2011-10-01 (×13): qty 1

## 2011-10-01 MED ORDER — BISACODYL 10 MG RE SUPP: 10.0000 mg | Freq: Every day | RECTAL | Status: DC | PRN

## 2011-10-01 MED ORDER — ACETAMINOPHEN 325 MG PO TABS: 650.0000 mg | ORAL_TABLET | Freq: Four times a day (QID) | ORAL | Status: DC | PRN

## 2011-10-01 MED ORDER — SIMVASTATIN 10 MG PO TABS
10.0000 mg | ORAL_TABLET | Freq: Every day | ORAL | Status: DC
  Administered 2011-10-02 – 2011-10-05 (×4): 10 mg via ORAL
  Filled 2011-10-01 (×5): qty 1

## 2011-10-01 MED ORDER — SIMVASTATIN 20 MG PO TABS
20.0000 mg | ORAL_TABLET | Freq: Every day | ORAL | Status: DC
  Administered 2011-10-01: 20 mg via ORAL
  Filled 2011-10-01: qty 1

## 2011-10-01 MED ORDER — ACETAMINOPHEN 650 MG RE SUPP: 650.0000 mg | Freq: Four times a day (QID) | RECTAL | Status: DC | PRN

## 2011-10-01 MED ORDER — METOPROLOL TARTRATE 50 MG PO TABS
50.0000 mg | ORAL_TABLET | Freq: Two times a day (BID) | ORAL | Status: DC
  Administered 2011-10-01 – 2011-10-06 (×12): 50 mg via ORAL
  Filled 2011-10-01 (×14): qty 1

## 2011-10-01 MED ORDER — IPRATROPIUM BROMIDE 0.02 % IN SOLN: 0.5000 mg | RESPIRATORY_TRACT | Status: DC | PRN

## 2011-10-01 MED ORDER — POTASSIUM CHLORIDE CRYS ER 20 MEQ PO TBCR
40.0000 meq | EXTENDED_RELEASE_TABLET | Freq: Once | ORAL | Status: AC
  Administered 2011-10-01: 40 meq via ORAL
  Filled 2011-10-01: qty 2

## 2011-10-01 MED ORDER — ONDANSETRON HCL 4 MG PO TABS: 4.0000 mg | ORAL_TABLET | Freq: Four times a day (QID) | ORAL | Status: DC | PRN

## 2011-10-01 MED ORDER — DEMECLOCYCLINE HCL 150 MG PO TABS
300.0000 mg | ORAL_TABLET | Freq: Two times a day (BID) | ORAL | Status: DC
  Administered 2011-10-01 – 2011-10-06 (×12): 300 mg via ORAL
  Filled 2011-10-01 (×14): qty 2

## 2011-10-01 MED ORDER — SODIUM CHLORIDE 0.9 % IV SOLN
500.0000 mg | Freq: Three times a day (TID) | INTRAVENOUS | Status: DC
  Administered 2011-10-01 – 2011-10-04 (×11): 500 mg via INTRAVENOUS
  Filled 2011-10-01 (×15): qty 500

## 2011-10-01 MED ORDER — SODIUM CHLORIDE 0.9 % IJ SOLN: 3.0000 mL | Freq: Two times a day (BID) | INTRAMUSCULAR | Status: DC

## 2011-10-01 NOTE — Progress Notes (Signed)
ANTIBIOTIC CONSULT NOTE - Initial  Pharmacy Consult for Primaxin Indication: UTI  No Known Allergies  Patient Measurements: Height: 5\' 9"  (175.3 cm) Weight: 174 lb 13.2 oz (79.3 kg) IBW/kg (Calculated) : 66.2   Vital Signs: Temp: 98.1 F (36.7 C) (07/10 0011) Temp src: Oral (07/10 0011) BP: 182/69 mmHg (07/10 0011) Pulse Rate: 63  (07/10 0011) Intake/Output from previous day: 07/09 0701 - 07/10 0700 In: 240 [P.O.:240] Out: -  Intake/Output from this shift: Total I/O In: 240 [P.O.:240] Out: -   Labs:  Basename 09/30/11 1710 09/29/11 0505 09/28/11 0500  WBC 10.1 -- 7.5  HGB 11.2* -- 10.2*  PLT 264 -- 237  LABCREA -- -- --  CREATININE 0.82 0.87 0.82   Estimated Creatinine Clearance: 47.7 ml/min (by C-G formula based on Cr of 0.82). No results found for this basename: VANCOTROUGH:2,VANCOPEAK:2,VANCORANDOM:2,GENTTROUGH:2,GENTPEAK:2,GENTRANDOM:2,TOBRATROUGH:2,TOBRAPEAK:2,TOBRARND:2,AMIKACINPEAK:2,AMIKACINTROU:2,AMIKACIN:2, in the last 72 hours   Microbiology: Recent Results (from the past 720 hour(s))  URINE CULTURE     Status: Normal   Collection Time   09/04/11  3:16 AM      Component Value Range Status Comment   Specimen Description URINE, CATHETERIZED   Final    Special Requests NONE   Final    Culture  Setup Time [id number]   Final    Colony Count NO GROWTH   Final    Culture NO GROWTH   Final    Report Status 09/05/2011 FINAL   Final   URINE CULTURE     Status: Normal   Collection Time   09/22/11  6:12 AM      Component Value Range Status Comment   Specimen Description URINE, CATHETERIZED   Final    Special Requests NONE   Final    Culture  Setup Time 09/22/2011 12:53   Final    Colony Count >=100,000 COLONIES/ML   Final    Culture     Final    Value: ESCHERICHIA COLI     Note: Confirmed Extended Spectrum Beta-Lactamase Producer (ESBL) CRITICAL RESULT CALLED TO, READ BACK BY AND VERIFIED WITH: ZENEIDA@12 :40PM ON 09/24/11 BY DANTS   Report Status 09/24/2011  FINAL   Final    Organism ID, Bacteria ESCHERICHIA COLI   Final   MRSA PCR SCREENING     Status: Normal   Collection Time   09/22/11  8:43 AM      Component Value Range Status Comment   MRSA by PCR NEGATIVE  NEGATIVE Final   CULTURE, BLOOD (ROUTINE X 2)     Status: Normal   Collection Time   09/22/11 10:04 AM      Component Value Range Status Comment   Specimen Description BLOOD LEFT ARM   Final    Special Requests BOTTLES DRAWN AEROBIC ONLY 10CC   Final    Culture  Setup Time 09/23/2011 02:12   Final    Culture NO GROWTH 5 DAYS   Final    Report Status 09/29/2011 FINAL   Final   CULTURE, BLOOD (ROUTINE X 2)     Status: Normal   Collection Time   09/22/11 10:13 AM      Component Value Range Status Comment   Specimen Description BLOOD LEFT HAND   Final    Special Requests BOTTLES DRAWN AEROBIC ONLY [facility]   Final    Culture  Setup Time 09/23/2011 02:12   Final    Culture NO GROWTH 5 DAYS   Final    Report Status 09/29/2011 FINAL   Final  Anti-infectives     Start     Dose/Rate Route Frequency Ordered Stop   10/01/11 0100   imipenem-cilastatin (PRIMAXIN) 500 mg in sodium chloride 0.9 % 100 mL IVPB        500 mg 200 mL/hr over 30 Minutes Intravenous 3 times per day 10/01/11 0025     10/01/11 0022   demeclocycline (DECLOMYCIN) tablet 300 mg        300 mg Oral 2 times daily 10/01/11 0022     09/30/11 2230   imipenem-cilastatin (PRIMAXIN) 500 mg in sodium chloride 0.9 % 100 mL IVPB  Status:  Discontinued        500 mg 200 mL/hr over 30 Minutes Intravenous  Once 09/30/11 2228 10/01/11 0025          Assessment: Pt discharged earlier today but on route to [facility] began to have nosebleed, nausea, HTN. Now readmitted. To continue primaxin- on day # 7 for ESBL E. Coli UTI. Pt is afebrile and last WBC was WNL. Estimated CrCl ~14ml/min so dose is appropriate.   Plan:  1. Continue primaxin 500mg  IV Q8H for now 2. Clarify length of therapy 3. Monitor renal function and adjust as  appropriate   Christoper Fabian, PharmD, BCPS Clinical pharmacist, pager [phone] 10/01/2011,12:26 AM

## 2011-10-01 NOTE — Progress Notes (Signed)
Inpatient Diabetes Program Recommendations  AACE/ADA: New Consensus Statement on Inpatient Glycemic Control (2009)  Target Ranges:  Prepandial:   less than 140 mg/dL      Peak postprandial:   less than 180 mg/dL (1-2 hours)      Critically ill patients:  140 - 180 mg/dL    Results for AMARILIS, BELFLOWER (MRN [id number]) as of 10/01/2011 12:19  Ref. Range 10/01/2011 00:10 10/01/2011 03:46 10/01/2011 07:45 10/01/2011 11:38  Glucose-Capillary Latest Range: 70-99 mg/dL 119 (H) 147 (H) 829 (H) 217 (H)     Inpatient Diabetes Program Recommendations Insulin - Basal: Please start at least 1/2 home basal insulin dose- Lantus 9 units QHS & titrate as needed.  Note: Will follow. Ambrose Finland RN, MSN, CDE Diabetes Coordinator Inpatient Diabetes Program [phone]

## 2011-10-01 NOTE — ED Provider Notes (Signed)
I saw and evaluated the patient, reviewed the resident's note and I agree with the findings and plan.   Kyrie Fludd, MD 10/01/11 1551 

## 2011-10-01 NOTE — Progress Notes (Signed)
Pt admitted to 6714 from ED. Pt A/0 x2. Pt placed on telemetry. Pt placed on contact. Stage 1 to sacrum. Silicone dressing applied. Scabs to arm and legs.

## 2011-10-01 NOTE — Progress Notes (Signed)
Subjective: Patient lying in bed, answer some questions, fall sleep.  Denies abdominal pain, no vomiting.  Daughter and son at bedside, questions answer, results reviewed with them.  Objective: Filed Vitals:   09/30/11 2326 10/01/11 0011 10/01/11 0541 10/01/11 1002  BP: 168/49 182/69 184/69 175/46  Pulse: 67 63 61 63  Temp: 98 F (36.7 C) 98.1 F (36.7 C) 98.2 F (36.8 C) 98 F (36.7 C)  TempSrc: Oral Oral Oral Oral  Resp: 22 20 20 20   Height:  5\' 9"  (1.753 m)    Weight:  79.3 kg (174 lb 13.2 oz)    SpO2: 99% 95% 99% 97%   Weight change:    General: Alert, awake, oriented x3, in no acute distress.  HEENT: No bruits, no goiter.  Heart: Regular rate and rhythm, without murmurs, rubs, gallops.  Lungs: CTA bilateral air movement.  Abdomen: Soft, nontender, nondistended, positive bowel sounds.  Extremities; no edema.   Lab Results:  [facility] 10/01/11 0615 09/30/11 1710 09/29/11 0505  NA 137 135 --  K 3.3* 3.5 --  CL 91* 90* --  CO2 32 35* --  GLUCOSE 189* 251* --  BUN 11 11 --  CREATININE 0.81 0.82 --  CALCIUM 8.7 8.7 --  MG -- -- 1.8  PHOS -- -- --    Basename 10/01/11 0615 09/30/11 1701  AST 14 23  ALT 10 13  ALKPHOS 94 106  BILITOT 0.3 0.2*  PROT 5.7* 6.5  ALBUMIN 2.7* 2.9*    Basename 09/30/11 1701  LIPASE 27  AMYLASE --    Basename 10/01/11 0615 09/30/11 1710  WBC 8.0 10.1  NEUTROABS 5.9 7.8*  HGB 10.4* 11.2*  HCT 32.5* 34.7*  MCV 92.9 92.8  PLT 228 264    Micro Results: Recent Results (from the past 240 hour(s))  URINE CULTURE     Status: Normal   Collection Time   09/22/11  6:12 AM      Component Value Range Status Comment   Specimen Description URINE, CATHETERIZED   Final    Special Requests NONE   Final    Culture  Setup Time 09/22/2011 12:53   Final    Colony Count >=100,000 COLONIES/ML   Final    Culture     Final    Value: ESCHERICHIA COLI     Note: Confirmed Extended Spectrum Beta-Lactamase Producer (ESBL) CRITICAL RESULT CALLED  TO, READ BACK BY AND VERIFIED WITH: ZENEIDA@12 :40PM ON 09/24/11 BY DANTS   Report Status 09/24/2011 FINAL   Final    Organism ID, Bacteria ESCHERICHIA COLI   Final   MRSA PCR SCREENING     Status: Normal   Collection Time   09/22/11  8:43 AM      Component Value Range Status Comment   MRSA by PCR NEGATIVE  NEGATIVE Final   CULTURE, BLOOD (ROUTINE X 2)     Status: Normal   Collection Time   09/22/11 10:04 AM      Component Value Range Status Comment   Specimen Description BLOOD LEFT ARM   Final    Special Requests BOTTLES DRAWN AEROBIC ONLY 10CC   Final    Culture  Setup Time 09/23/2011 02:12   Final    Culture NO GROWTH 5 DAYS   Final    Report Status 09/29/2011 FINAL   Final   CULTURE, BLOOD (ROUTINE X 2)     Status: Normal   Collection Time   09/22/11 10:13 AM      Component Value Range Status  Comment   Specimen Description BLOOD LEFT HAND   Final    Special Requests BOTTLES DRAWN AEROBIC ONLY [facility]   Final    Culture  Setup Time 09/23/2011 02:12   Final    Culture NO GROWTH 5 DAYS   Final    Report Status 09/29/2011 FINAL   Final     Studies/Results: Dg Abd 1 View  09/30/2011  *RADIOLOGY REPORT*  Clinical Data: Epistaxis  ABDOMEN - 1 VIEW  Comparison: 09/26/2011  Findings: Generalized gaseous distention of small and large bowel is present.  Calcified uterine fibroids project over the pelvis. Osteopenia.  Degenerative change of the hip joints right greater than left.  No obvious free intraperitoneal gas.  IMPRESSION: Mild generalized bowel distention.  Original Report Authenticated By: Donavan Burnet, M.D.   Dg Chest Port 1 View  09/30/2011  *RADIOLOGY REPORT*  Clinical Data: Hypertension.  Epistaxis.  PORTABLE CHEST - 1 VIEW  Comparison: 09/27/2011  Findings: Moderate cardiomegaly.  Under aerated lungs.  Left basilar atelectasis.  No pneumothorax.  IMPRESSION: Cardiomegaly.  Left basilar atelectasis.  Original Report Authenticated By: Donavan Burnet, M.D.    Medications: I have reviewed  the patient's current medications.  1. Nausea and vomiting -Unclear etiology, Differential medications, Ileus. CT abdomen pending. Doubt worsening infection UA with less WBC than prior admission. Lipase negative, LFT normal. Per daughter prior history of gallstone but on exam no pain on RUQ and LFT norma.   2. Recent admission for sepsis secondary to UTI and was found to have ESBL - urine cultures showed Escherichia coli sensitive to imipenem.  Continue with IV antibiotics until able to tolerates oral medications. UA will less WBC, follow urine culture. She received 7 days during prior admission. 8/10.  3. Uncontrolled hypertension probably related to nausea and vomiting - continue present home medications. Place patient on when necessary IV hydralazine for systolic blood pressure was 160.  4. History of CHF diastolic - I have held her Lasix for now. Closely follow intake output and metabolic panel and respiratory status.  5. Diabetes mellitus type 2 - until patient's by mouth intake is reliable patient will be on sliding-scale coverage. Restart Lantus once patient is able to eat well.  6. History of severe hyponatremia recently admitted last month and was on demeclocycline - Follow metabolic panel closely.  7-Hypokalemia: replace.     LOS: 1 day   Makenzie Vittorio M.D.  [facility] 10/01/2011, 2:04 PM

## 2011-10-01 NOTE — H&P (Signed)
[name] is an 78 y.o. female.   PCP - Dr.Querrshi, [name]. Chief Complaint: Nausea and vomiting. HPI: 78 year old female was discharged yesterday after being treated for sepsis secondary to UTI and was found to have ESBL was brought back to the ER after patient was about to reach nursing home in the ambulance when patient started developing multiple episodes of nausea and vomiting and was found to have high blood pressure and developed epistaxis. By the time patient reached the ER the epistaxis has resolved. Patient did not have any further episodes of nausea vomiting but still feels uncomfortable. Denies any abdominal pain. The last time she moved her bowels was more than 48 hours ago. X-rays show distention of the small and large bowels. Patient will be admitted for further observation.  Past Medical History  Diagnosis Date  . Dysphagia   . Diabetes mellitus   . Hypertension   . Hypothyroidism   . Hyperlipemia   . GERD (gastroesophageal reflux disease)   . Depression   . Anxiety   . Acute MI   . Pneumonia   . Stroke   . UTI (urinary tract infection)   . COPD (chronic obstructive pulmonary disease)   . CAD (coronary artery disease)   . Late effects of CVA (cerebrovascular accident)   . DEMENTIA   . Leukocytosis   . Anemia   . Hypokalemia   . Paralytic ileus   . Pleural effusion     Past Surgical History  Procedure Date  . Back surgery     Family History  Problem Relation Age of Onset  . Hypertension Sister   . Hypertension Brother    Social History:  reports that she has never smoked. She has never used smokeless tobacco. She reports that she does not drink alcohol or use illicit drugs.  Allergies: No Known Allergies  Medications Prior to Admission  Medication Sig Dispense Refill  . acetaminophen (TYLENOL) 325 MG tablet Take 650 mg by mouth every 4 (four) hours as needed. For pain.      Marland Kitchen albuterol (PROVENTIL) (5 MG/ML) 0.5% nebulizer solution Take 0.5 mLs (2.5  mg total) by nebulization every 2 (two) hours as needed for wheezing or shortness of breath.  20 mL    . aspirin 81 MG chewable tablet Chew 81 mg by mouth daily.      . beta carotene w/minerals (OCUVITE) tablet Take 1 tablet by mouth daily.        . bisacodyl (DULCOLAX) 10 MG suppository Place 10 mg rectally daily as needed. For constipation      . demeclocycline (DECLOMYCIN) 150 MG tablet Take 2 tablets (300 mg total) by mouth 2 (two) times daily.  20 tablet  0  . diltiazem (CARDIZEM CD) 180 MG 24 hr capsule Take 180 mg by mouth daily.      . feeding supplement (ENSURE COMPLETE) LIQD Take 237 mLs by mouth 3 (three) times daily between meals.      . furosemide (LASIX) 40 MG tablet Take 1 tablet (40 mg total) by mouth daily.  30 tablet    . glucagon (GLUCAGON EMERGENCY) 1 MG injection Inject 1 mg into the vein once as needed. 1 mg injection IM      . hydrALAZINE (APRESOLINE) 100 MG tablet Take 1 tablet (100 mg total) by mouth every 8 (eight) hours.      . insulin aspart (NOVOLOG) 100 UNIT/ML injection Inject 4 Units into the skin 3 (three) times daily with meals.       Marland Kitchen  insulin glargine (LANTUS) 100 UNIT/ML injection Inject 18 Units into the skin at bedtime.  10 mL  0  . ipratropium (ATROVENT) 0.02 % nebulizer solution Take 0.5 mg by nebulization every 2 (two) hours as needed. For shortness of breath.      . lactulose (CHRONULAC) 10 GM/15ML solution Take 20 g by mouth 2 (two) times daily.      Marland Kitchen levothyroxine (SYNTHROID, LEVOTHROID) 100 MCG tablet Take 100 mcg by mouth daily before breakfast.      . metoprolol (LOPRESSOR) 50 MG tablet Take 50 mg by mouth 2 (two) times daily.        Marland Kitchen omeprazole (PRILOSEC) 20 MG capsule Take 20 mg by mouth 2 (two) times daily.        . polyethylene glycol (MIRALAX / GLYCOLAX) packet Take 17 g by mouth daily.        . potassium chloride (K-DUR) 10 MEQ tablet Take 10 mEq by mouth daily.      Marland Kitchen senna-docusate (SENOKOT-S) 8.6-50 MG per tablet Take 1 tablet by mouth 2  (two) times daily.      . simvastatin (ZOCOR) 20 MG tablet Take 20 mg by mouth daily.        Marland Kitchen sulfamethoxazole-trimethoprim (BACTRIM) 400-80 MG per tablet Take 1 tablet by mouth 2 (two) times daily.  6 tablet  0    Results for orders placed during the hospital encounter of 09/30/11 (from the past 48 hour(s))  HEPATIC FUNCTION PANEL     Status: Abnormal   Collection Time   09/30/11  5:01 PM      Component Value Range Comment   Total Protein 6.5  6.0 - 8.3 g/dL    Albumin 2.9 (*) 3.5 - 5.2 g/dL    AST 23  0 - 37 U/L    ALT 13  0 - 35 U/L    Alkaline Phosphatase 106  39 - 117 U/L    Total Bilirubin 0.2 (*) 0.3 - 1.2 mg/dL    Bilirubin, Direct <9.6  0.0 - 0.3 mg/dL    Indirect Bilirubin NOT CALCULATED  0.3 - 0.9 mg/dL   LIPASE, BLOOD     Status: Normal   Collection Time   09/30/11  5:01 PM      Component Value Range Comment   Lipase 27  11 - 59 U/L   CBC WITH DIFFERENTIAL     Status: Abnormal   Collection Time   09/30/11  5:10 PM      Component Value Range Comment   WBC 10.1  4.0 - 10.5 K/uL    RBC 3.74 (*) 3.87 - 5.11 MIL/uL    Hemoglobin 11.2 (*) 12.0 - 15.0 g/dL    HCT 04.5 (*) 40.9 - 46.0 %    MCV 92.8  78.0 - 100.0 fL    MCH 29.9  26.0 - 34.0 pg    MCHC 32.3  30.0 - 36.0 g/dL    RDW 81.1  91.4 - 78.2 %    Platelets 264  150 - 400 K/uL    Neutrophils Relative 78 (*) 43 - 77 %    Neutro Abs 7.8 (*) 1.7 - 7.7 K/uL    Lymphocytes Relative 13  12 - 46 %    Lymphs Abs 1.3  0.7 - 4.0 K/uL    Monocytes Relative 7  3 - 12 %    Monocytes Absolute 0.7  0.1 - 1.0 K/uL    Eosinophils Relative 1  0 - 5 %  Eosinophils Absolute 0.1  0.0 - 0.7 K/uL    Basophils Relative 1  0 - 1 %    Basophils Absolute 0.1  0.0 - 0.1 K/uL   BASIC METABOLIC PANEL     Status: Abnormal   Collection Time   09/30/11  5:10 PM      Component Value Range Comment   Sodium 135  135 - 145 mEq/L    Potassium 3.5  3.5 - 5.1 mEq/L DELTA CHECK NOTED   Chloride 90 (*) 96 - 112 mEq/L    CO2 35 (*) 19 - 32 mEq/L     Glucose, Bld 251 (*) 70 - 99 mg/dL    BUN 11  6 - 23 mg/dL    Creatinine, Ser 1.61  0.50 - 1.10 mg/dL    Calcium 8.7  8.4 - 09.6 mg/dL    GFR calc non Af Amer 61 (*) >90 mL/min    GFR calc Af Amer 71 (*) >90 mL/min   LACTIC ACID, PLASMA     Status: Normal   Collection Time   09/30/11  5:10 PM      Component Value Range Comment   Lactic Acid, Venous 1.9  0.5 - 2.2 mmol/L   POCT I-STAT TROPONIN I     Status: Normal   Collection Time   09/30/11  5:24 PM      Component Value Range Comment   Troponin i, poc 0.01  0.00 - 0.08 ng/mL    Comment 3            URINALYSIS, ROUTINE W REFLEX MICROSCOPIC     Status: Abnormal   Collection Time   09/30/11  6:57 PM      Component Value Range Comment   Color, Urine YELLOW  YELLOW    APPearance CLEAR  CLEAR    Specific Gravity, Urine 1.012  1.005 - 1.030    pH 6.0  5.0 - 8.0    Glucose, UA NEGATIVE  NEGATIVE mg/dL    Hgb urine dipstick NEGATIVE  NEGATIVE    Bilirubin Urine SMALL (*) NEGATIVE    Ketones, ur NEGATIVE  NEGATIVE mg/dL    Protein, ur NEGATIVE  NEGATIVE mg/dL    Urobilinogen, UA 0.2  0.0 - 1.0 mg/dL    Nitrite NEGATIVE  NEGATIVE    Leukocytes, UA MODERATE (*) NEGATIVE   URINE MICROSCOPIC-ADD ON     Status: Abnormal   Collection Time   09/30/11  6:57 PM      Component Value Range Comment   Squamous Epithelial / LPF RARE  RARE    WBC, UA 7-10  <3 WBC/hpf    Casts HYALINE CASTS (*) NEGATIVE    Urine-Other MUCOUS PRESENT     GLUCOSE, CAPILLARY     Status: Abnormal   Collection Time   10/01/11 12:10 AM      Component Value Range Comment   Glucose-Capillary 201 (*) 70 - 99 mg/dL    Dg Abd 1 View  0/06/5407  *RADIOLOGY REPORT*  Clinical Data: Epistaxis  ABDOMEN - 1 VIEW  Comparison: 09/26/2011  Findings: Generalized gaseous distention of small and large bowel is present.  Calcified uterine fibroids project over the pelvis. Osteopenia.  Degenerative change of the hip joints right greater than left.  No obvious free intraperitoneal gas.   IMPRESSION: Mild generalized bowel distention.  Original Report Authenticated By: Donavan Burnet, M.D.   Dg Chest Port 1 View  09/30/2011  *RADIOLOGY REPORT*  Clinical Data: Hypertension.  Epistaxis.  PORTABLE CHEST -  1 VIEW  Comparison: 09/27/2011  Findings: Moderate cardiomegaly.  Under aerated lungs.  Left basilar atelectasis.  No pneumothorax.  IMPRESSION: Cardiomegaly.  Left basilar atelectasis.  Original Report Authenticated By: Donavan Burnet, M.D.    Review of Systems  Constitutional: Negative.   HENT: Positive for nosebleeds.   Eyes: Negative.   Respiratory: Negative.   Cardiovascular: Negative.   Gastrointestinal: Positive for nausea and vomiting.  Genitourinary: Negative.   Musculoskeletal: Negative.   Skin: Negative.   Neurological: Negative.   Endo/Heme/Allergies: Negative.   Psychiatric/Behavioral: Negative.     Blood pressure 182/69, pulse 63, temperature 98.1 F (36.7 C), temperature source Oral, resp. rate 20, height 5\' 9"  (1.753 m), weight 79.3 kg (174 lb 13.2 oz), SpO2 95.00%. Physical Exam  Constitutional: She appears well-developed and well-nourished. No distress.  HENT:  Head: Normocephalic and atraumatic.  Right Ear: External ear normal.  Left Ear: External ear normal.  Nose: Nose normal.  Mouth/Throat: Oropharynx is clear and moist. No oropharyngeal exudate.  Eyes: Conjunctivae are normal. Pupils are equal, round, and reactive to light. Right eye exhibits no discharge. Left eye exhibits no discharge. No scleral icterus.  Neck: Normal range of motion. Neck supple.  Cardiovascular: Normal rate and regular rhythm.   Respiratory: Effort normal and breath sounds normal. No respiratory distress. She has no wheezes. She has no rales.  GI: Soft. Bowel sounds are normal. She exhibits distension. There is no tenderness. There is no rebound.  Musculoskeletal: Normal range of motion. She exhibits no edema and no tenderness.  Neurological: She is alert.       Follows  commands and moves all extremities.  Skin: Skin is warm and dry. She is not diaphoretic.     Assessment/Plan #1. Nausea and vomiting - not sure if patient is developing ileus. On exam patient does have bowel sounds. I have ordered CT abdomen pelvis to check for any obstruction or developing ileus. I have placed patient on clear liquid diet. #2. Recent admission for sepsis secondary to UTI and was found to have ESBL - urine cultures showed Escherichia coli sensitive to imipenem. At this time as patient's by mouth intake is unreliable I have placed patient on IV imipenem. Repeat urine culture has been ordered. #3. Uncontrolled hypertension probably related to nausea and vomiting - continue present home medications. Place patient on when necessary IV hydralazine for systolic blood pressure was 160. #4. History of CHF diastolic - I have held her Lasix for now. Closely follow intake output and metabolic panel and respiratory status. #5. Diabetes mellitus type 2 - until patient's by mouth intake is reliable patient will be on sliding-scale coverage. Restart Lantus once patient is able to eat well. #6. History of severe hyponatremia recently admitted last month and was on demeclocycline - Follow metabolic panel closely.   CODE STATUS - DO NOT RESUSCITATE as confirmed with patient's family.  Imad Shostak N. 10/01/2011, 2:02 AM

## 2011-10-02 ENCOUNTER — Inpatient Hospital Stay (HOSPITAL_COMMUNITY): Payer: Medicare Other

## 2011-10-02 DIAGNOSIS — I509 Heart failure, unspecified: Secondary | ICD-10-CM

## 2011-10-02 DIAGNOSIS — I5032 Chronic diastolic (congestive) heart failure: Secondary | ICD-10-CM

## 2011-10-02 DIAGNOSIS — A498 Other bacterial infections of unspecified site: Secondary | ICD-10-CM

## 2011-10-02 DIAGNOSIS — E039 Hypothyroidism, unspecified: Secondary | ICD-10-CM

## 2011-10-02 LAB — GLUCOSE, CAPILLARY
Glucose-Capillary: 210 mg/dL — ABNORMAL HIGH (ref 70–99)
Glucose-Capillary: 225 mg/dL — ABNORMAL HIGH (ref 70–99)
Glucose-Capillary: 220 mg/dL — ABNORMAL HIGH (ref 70–99)

## 2011-10-02 MED ORDER — BOOST / RESOURCE BREEZE PO LIQD
1.0000 | Freq: Three times a day (TID) | ORAL | Status: DC
  Administered 2011-10-02 – 2011-10-06 (×6): 1 via ORAL

## 2011-10-02 MED ORDER — FUROSEMIDE 40 MG PO TABS
40.0000 mg | ORAL_TABLET | Freq: Every day | ORAL | Status: DC
  Administered 2011-10-02 – 2011-10-03 (×2): 40 mg via ORAL
  Filled 2011-10-02 (×2): qty 1

## 2011-10-02 MED ORDER — PANTOPRAZOLE SODIUM 40 MG PO PACK
40.0000 mg | PACK | Freq: Two times a day (BID) | ORAL | Status: DC
  Administered 2011-10-02 – 2011-10-06 (×8): 40 mg
  Filled 2011-10-02 (×11): qty 20

## 2011-10-02 MED ORDER — PANTOPRAZOLE SODIUM 40 MG PO PACK
40.0000 mg | PACK | Freq: Every day | ORAL | Status: DC
  Filled 2011-10-02 (×2): qty 20

## 2011-10-02 MED ORDER — ENOXAPARIN SODIUM 40 MG/0.4ML ~~LOC~~ SOLN
40.0000 mg | SUBCUTANEOUS | Status: DC
  Administered 2011-10-02 – 2011-10-05 (×4): 40 mg via SUBCUTANEOUS
  Filled 2011-10-02 (×5): qty 0.4

## 2011-10-02 MED ORDER — POTASSIUM CHLORIDE CRYS ER 20 MEQ PO TBCR
40.0000 meq | EXTENDED_RELEASE_TABLET | Freq: Once | ORAL | Status: AC
  Administered 2011-10-02: 40 meq via ORAL
  Filled 2011-10-02: qty 2

## 2011-10-02 NOTE — Progress Notes (Addendum)
INITIAL ADULT NUTRITION ASSESSMENT Date: 10/02/2011   Time: 11:11 AM  INTERVENTION:  Discontinue Ensure Complete  Try Boost Breeze PO TID   DOCUMENTATION CODES Per approved criteria  -Non-severe (moderate) malnutrition in the context of acute illness or injury    Reason for Assessment: Nutrition Risk report (dependent feeder)  ASSESSMENT: Female 78 y.o.  Dx: Nausea and vomiting  Hx:  Past Medical History  Diagnosis Date  . Dysphagia   . Diabetes mellitus   . Hypertension   . Hypothyroidism   . Hyperlipemia   . GERD (gastroesophageal reflux disease)   . Depression   . Anxiety   . Acute MI   . Pneumonia   . Stroke   . UTI (urinary tract infection)   . COPD (chronic obstructive pulmonary disease)   . CAD (coronary artery disease)   . Late effects of CVA (cerebrovascular accident)   . DEMENTIA   . Leukocytosis   . Anemia   . Hypokalemia   . Paralytic ileus   . Pleural effusion     Past Surgical History  Procedure Date  . Back surgery     Related Meds:  Scheduled Meds:   . aspirin  81 mg Oral Daily  . demeclocycline  300 mg Oral BID  . diltiazem  180 mg Oral Daily  . feeding supplement  237 mL Oral TID BM  . furosemide  40 mg Oral Daily  . hydrALAZINE  100 mg Oral Q8H  . imipenem-cilastatin  500 mg Intravenous Q8H  . insulin aspart  0-9 Units Subcutaneous TID WC  . levothyroxine  100 mcg Oral QAC breakfast  . metoprolol  50 mg Oral BID  . pantoprazole  40 mg Oral Q1200  . polyethylene glycol  17 g Oral BID  . potassium chloride  40 mEq Oral Once  . simvastatin  10 mg Oral Daily  . DISCONTD: simvastatin  20 mg Oral Daily  . DISCONTD: sodium chloride  3 mL Intravenous Q12H  . DISCONTD: sodium chloride  3 mL Intravenous Q12H   Continuous Infusions:  PRN Meds:.acetaminophen, acetaminophen, albuterol, bisacodyl, glucagon, hydrALAZINE, ipratropium, ondansetron (ZOFRAN) IV, ondansetron   Ht: 5\' 9"  (175.3 cm)  Wt: 176 lb 5.9 oz (80 kg)  Ideal Wt:  65.9 kg % Ideal Wt: 121%  Wt Readings from Last 12 Encounters:  10/01/11 176 lb 5.9 oz (80 kg)  09/29/11 182 lb 14.4 oz (82.963 kg)  09/07/11 176 lb 5.9 oz (80 kg)  08/21/11 177 lb 0.5 oz (80.3 kg)  06/22/11 189 lb (85.73 kg)    Usual Wt: 189 lb (4 months ago) % Usual Wt: 93%  Body mass index is 26.05 kg/(m^2).  Food/Nutrition Related Hx: poor intake over the past 2-3 weeks per family member.  Requires assistance with feeding--someone usually feeds her per son.  Dysphagia 3 thin liquid diet on previous admission.  Labs:  CMP     Component Value Date/Time   NA 137 10/01/2011 0615   K 3.3* 10/01/2011 0615   CL 91* 10/01/2011 0615   CO2 32 10/01/2011 0615   GLUCOSE 189* 10/01/2011 0615   BUN 11 10/01/2011 0615   CREATININE 0.81 10/01/2011 0615   CALCIUM 8.7 10/01/2011 0615   PROT 5.7* 10/01/2011 0615   ALBUMIN 2.7* 10/01/2011 0615   AST 14 10/01/2011 0615   ALT 10 10/01/2011 0615   ALKPHOS 94 10/01/2011 0615   BILITOT 0.3 10/01/2011 0615   GFRNONAA 62* 10/01/2011 0615   GFRAA 72* 10/01/2011 0615  CBG (last 3)   Basename 10/02/11 0802 10/02/11 0408 10/02/11 0006  GLUCAP 220* 210* 188*    Potassium  Date/Time Value Range Status  10/01/2011  6:15 AM 3.3* 3.5 - 5.1 mEq/L Final  09/30/2011  5:10 PM 3.5  3.5 - 5.1 mEq/L Final     DELTA CHECK NOTED  09/29/2011  5:05 AM 4.5  3.5 - 5.1 mEq/L Final     HEMOLYSIS AT THIS LEVEL MAY AFFECT RESULT      Intake/Output Summary (Last 24 hours) at 10/02/11 1115 Last data filed at 10/02/11 0900  Gross per 24 hour  Intake    360 ml  Output      0 ml  Net    360 ml     Diet Order: Clear Liquid  Supplements/Tube Feeding:  Ensure Complete PO TID between meals  IVF:  None  Estimated Nutritional Needs:   Kcal: 1500-1700 Protein: 80-90 grams Fluid: 1.5-1.7 liters  Patient with non-severe (moderate) malnutrition in the context of acute illness, given recent 7% weight loss in 4 months and intake < 75% of estimated energy requirement for > 7  days.  Current intake since this admission has been minimal related to abdominal pain per son.  Patient does not like the Ensure supplements.  Per patient's son, she has not been eating very much for the past 2-3 weeks.  Patient was discharged to a nursing home 7/9, then immediately re-admitted for multiple episodes of nausea and vomiting.  NUTRITION DIAGNOSIS: -Malnutrition.  Status: Ongoing  RELATED TO: nausea, abdominal pain  AS EVIDENCE BY: 7% weight loss in 4 months and intake < 75% of estimated energy requirement for > 7 days  MONITORING/EVALUATION(Goals):  Goal:  Intake to meet >90% of estimated nutrition needs to prevent further depletion of nutrition stores.  Monitor:  PO intake, diet advancement, weight trend.  EDUCATION NEEDS: -Education not appropriate at this time      Joaquin Courts, RD, CNSC, [place] Pager# [phone] After Hours Pager# [phone]  10/02/2011, 11:11 AM

## 2011-10-02 NOTE — Progress Notes (Signed)
Inpatient Diabetes Program Recommendations  AACE/ADA: New Consensus Statement on Inpatient Glycemic Control (2009)  Target Ranges:  Prepandial:   less than 140 mg/dL      Peak postprandial:   less than 180 mg/dL (1-2 hours)      Critically ill patients:  140 - 180 mg/dL   Results for NYANA, HAREN (MRN [id number]) as of 10/02/2011 14:45  Ref. Range 10/02/2011 00:06 10/02/2011 04:08 10/02/2011 08:02 10/02/2011 11:32  Glucose-Capillary Latest Range: 70-99 mg/dL 865 (H) 784 (H) 696 (H) 225 (H)     Inpatient Diabetes Program Recommendations Insulin - Basal: Please start at least 1/2 home basal insulin dose- Lantus 9 units QHS & titrate as needed.  Note: Will follow. Ambrose Finland RN, MSN, CDE Diabetes Coordinator Inpatient Diabetes Program [phone]'

## 2011-10-02 NOTE — Progress Notes (Signed)
Subjective: Patient awake, following command, neuro exam non focal. She denies abdominal pain.  Per daughter patient does not want to eat. When drinks complaint of sickness stomach. Patient denies pain on swallowing.  Objective: Filed Vitals:   10/01/11 1800 10/01/11 2023 10/02/11 0607 10/02/11 1000  BP: 176/66 158/61 176/54 190/49  Pulse: 66 61 51 63  Temp: 98.9 F (37.2 C) 98.4 F (36.9 C) 97.6 F (36.4 C) 98.2 F (36.8 C)  TempSrc: Oral Oral Oral Oral  Resp: 20 16 18 18   Height:      Weight:  80 kg (176 lb 5.9 oz)    SpO2: 100% 98% 97% 94%   Weight change: 0.7 kg (1 lb 8.7 oz)   General: Alert, awake, oriented x3, in no acute distress.  HEENT: No bruits, no goiter.  Heart: Regular rate and rhythm, without murmurs, rubs, gallops.  Lungs: Crackles bases,, bilateral air movement.  Abdomen: Soft, nontender, nondistended, positive bowel sounds.  Neuro: Grossly intact, nonfocal. Extremities; no edema.   Lab Results:  [facility] 10/01/11 0615 09/30/11 1710  NA 137 135  K 3.3* 3.5  CL 91* 90*  CO2 32 35*  GLUCOSE 189* 251*  BUN 11 11  CREATININE 0.81 0.82  CALCIUM 8.7 8.7  MG -- --  PHOS -- --    Basename 10/01/11 0615 09/30/11 1701  AST 14 23  ALT 10 13  ALKPHOS 94 106  BILITOT 0.3 0.2*  PROT 5.7* 6.5  ALBUMIN 2.7* 2.9*    Basename 09/30/11 1701  LIPASE 27  AMYLASE --    Basename 10/01/11 0615 09/30/11 1710  WBC 8.0 10.1  NEUTROABS 5.9 7.8*  HGB 10.4* 11.2*  HCT 32.5* 34.7*  MCV 92.9 92.8  PLT 228 264    Micro Results: No results found for this or any previous visit (from the past 240 hour(s)).  Studies/Results: Ct Abdomen Pelvis Wo Contrast  10/01/2011  *RADIOLOGY REPORT*  Clinical Data: Nausea and vomiting.  Constipation.  Sepsis and UTI.  CT ABDOMEN AND PELVIS WITHOUT CONTRAST  Technique:  Multidetector CT imaging of the abdomen and pelvis was performed following the standard protocol without intravenous contrast.  Comparison: 06/22/2011   Findings: There is a small right pleural effusion layering dependently.  There is dependent pulmonary atelectasis bilaterally. Extensive coronary artery calcification is noted.  There is a tiny amount of layering pleural fluid on the left.  No pericardial fluid.  The liver is unremarkable without contrast.  There are multiple gallstones in the gallbladder.  No sign of biliary obstruction or gross inflammation.  The spleen is unremarkable containing some small granulomas.  No lesion of the pancreas.  The adrenal glands are normal.  The kidneys show mild atrophy and some vascular calcification.  No cyst, mass, stone or hydronephrosis.  The aorta shows atherosclerotic change but no aneurysm.  The IVC is unremarkable. No retroperitoneal mass or adenopathy.  No free intraperitoneal fluid or air.  The appendix is normal.  Calcified uterine fibroid is again noted.  There is a small amount of air in the bladder, presumably due to previous catheterization.  No pelvic collection. Chronic degenerative and postsurgical changes effect the spine.  IMPRESSION: No acute finding.  Cholelithiasis without CT evidence of cholecystitis or obstruction.  Ultrasound suggested if there is clinical concern.  Pleural effusions right larger than the left with dependent atelectasis.  Coronary artery calcification.  Aortic calcification.  Original Report Authenticated By: Thomasenia Sales, M.D.   Dg Abd 1 View  09/30/2011  *RADIOLOGY  REPORT*  Clinical Data: Epistaxis  ABDOMEN - 1 VIEW  Comparison: 09/26/2011  Findings: Generalized gaseous distention of small and large bowel is present.  Calcified uterine fibroids project over the pelvis. Osteopenia.  Degenerative change of the hip joints right greater than left.  No obvious free intraperitoneal gas.  IMPRESSION: Mild generalized bowel distention.  Original Report Authenticated By: Donavan Burnet, M.D.   Dg Chest Port 1 View  09/30/2011  *RADIOLOGY REPORT*  Clinical Data: Hypertension.   Epistaxis.  PORTABLE CHEST - 1 VIEW  Comparison: 09/27/2011  Findings: Moderate cardiomegaly.  Under aerated lungs.  Left basilar atelectasis.  No pneumothorax.  IMPRESSION: Cardiomegaly.  Left basilar atelectasis.  Original Report Authenticated By: Donavan Burnet, M.D.    Medications: I have reviewed the patient's current medications.  1. Nausea and vomiting -Unclear etiology, Differential medications, Ileus, gastritis. CT abdomen negative for acute finding.  Doubt worsening infection UA with less WBC than prior admission. Lipase negative, LFT normal. Per daughter prior history of gallstone but on exam no pain on RUQ and LFT norma. Will get RUQ [place]. Will consider Reglan. I will increase protonix to twice a day.   2. Recent admission for sepsis secondary to UTI and was found to have ESBL - urine cultures showed Escherichia coli sensitive to imipenem.  Continue with IV antibiotics until able to tolerates oral medications. UA will less WBC, follow urine culture. She received 7 days during prior admission. 9/10.   3. Uncontrolled hypertension probably related to nausea and vomiting - continue present home medications. Place patient on when necessary IV hydralazine for systolic blood pressure was 160.   4. History of CHF diastolic -  Resume lasix.   5. Diabetes mellitus type 2 - until patient's by mouth intake is reliable patient will be on sliding-scale coverage. Restart Lantus once patient is able to eat well.   6. History of severe hyponatremia recently admitted last month and was on demeclocycline - Follow metabolic panel closely.   7-Hypokalemia: replace.  8-DVT prophylaxis: Lovenox.     LOS: 2 days   Paarth Cropper M.D.  [facility] 10/02/2011, 1:55 PM

## 2011-10-02 NOTE — Plan of Care (Signed)
Problem: Phase I Progression Outcomes Goal: Voiding-avoid urinary catheter unless indicated Outcome: Completed/Met Date Met:  10/02/11 Incont.

## 2011-10-03 LAB — GLUCOSE, CAPILLARY
Glucose-Capillary: 205 mg/dL — ABNORMAL HIGH (ref 70–99)
Glucose-Capillary: 250 mg/dL — ABNORMAL HIGH (ref 70–99)
Glucose-Capillary: 264 mg/dL — ABNORMAL HIGH (ref 70–99)
Glucose-Capillary: 289 mg/dL — ABNORMAL HIGH (ref 70–99)

## 2011-10-03 LAB — CBC
HCT: 35 % — ABNORMAL LOW (ref 36.0–46.0)
Hemoglobin: 11.4 g/dL — ABNORMAL LOW (ref 12.0–15.0)
MCV: 93.3 fL (ref 78.0–100.0)
RDW: 14.5 % (ref 11.5–15.5)
WBC: 7.9 10*3/uL (ref 4.0–10.5)

## 2011-10-03 LAB — BASIC METABOLIC PANEL
BUN: 10 mg/dL (ref 6–23)
Chloride: 87 mEq/L — ABNORMAL LOW (ref 96–112)
Creatinine, Ser: 0.74 mg/dL (ref 0.50–1.10)
GFR calc Af Amer: 84 mL/min — ABNORMAL LOW (ref >90)
Glucose, Bld: 235 mg/dL — ABNORMAL HIGH (ref 70–99)

## 2011-10-03 MED ORDER — FUROSEMIDE 20 MG PO TABS
20.0000 mg | ORAL_TABLET | Freq: Every day | ORAL | Status: DC
  Administered 2011-10-04 – 2011-10-06 (×3): 20 mg via ORAL
  Filled 2011-10-03 (×3): qty 1

## 2011-10-03 MED ORDER — INSULIN GLARGINE 100 UNIT/ML ~~LOC~~ SOLN
12.0000 [IU] | Freq: Every day | SUBCUTANEOUS | Status: DC
  Administered 2011-10-03 – 2011-10-05 (×3): 12 [IU] via SUBCUTANEOUS

## 2011-10-03 NOTE — Progress Notes (Signed)
Subjective: More awake today. Wondering how longer she needs to be on clear  diet.  No more vomiting.  Objective: Filed Vitals:   10/02/11 1800 10/02/11 2220 10/03/11 0514 10/03/11 0937  BP: 172/90 182/71 182/64 186/69  Pulse: 66 58 56 60  Temp: 97.8 F (36.6 C) 97.7 F (36.5 C) 97.5 F (36.4 C) 97.6 F (36.4 C)  TempSrc: Oral Oral Oral Oral  Resp: 20 19 18 17   Height:      Weight:  76.6 kg (168 lb 14 oz)    SpO2: 97% 97% 93% 99%   Weight change: -3.4 kg (-7 lb 7.9 oz)   General: Alert, awake, oriented x3, in no acute distress.  HEENT: No bruits, no goiter.  Heart: Regular rate and rhythm, without murmurs, rubs, gallops.  Lungs: CTA, bilateral air movement.  Abdomen: Soft, nontender, nondistended, positive bowel sounds.  Neuro: Grossly intact, nonfocal. Extremities; no edema.   Lab Results:  [facility] 10/03/11 0535 10/01/11 0615  NA 135 137  K 4.1 3.3*  CL 87* 91*  CO2 38* 32  GLUCOSE 235* 189*  BUN 10 11  CREATININE 0.74 0.81  CALCIUM 8.8 8.7  MG -- --  PHOS -- --    Basename 10/01/11 0615 09/30/11 1701  AST 14 23  ALT 10 13  ALKPHOS 94 106  BILITOT 0.3 0.2*  PROT 5.7* 6.5  ALBUMIN 2.7* 2.9*    Basename 09/30/11 1701  LIPASE 27  AMYLASE --    Basename 10/03/11 0535 10/01/11 0615 09/30/11 1710  WBC 7.9 8.0 --  NEUTROABS -- 5.9 7.8*  HGB 11.4* 10.4* --  HCT 35.0* 32.5* --  MCV 93.3 92.9 --  PLT 236 228 --   Micro Results: No results found for this or any previous visit (from the past 240 hour(s)).  Studies/Results: Ct Abdomen Pelvis Wo Contrast  10/01/2011  *RADIOLOGY REPORT*  Clinical Data: Nausea and vomiting.  Constipation.  Sepsis and UTI.  CT ABDOMEN AND PELVIS WITHOUT CONTRAST  Technique:  Multidetector CT imaging of the abdomen and pelvis was performed following the standard protocol without intravenous contrast.  Comparison: 06/22/2011  Findings: There is a small right pleural effusion layering dependently.  There is dependent pulmonary  atelectasis bilaterally. Extensive coronary artery calcification is noted.  There is a tiny amount of layering pleural fluid on the left.  No pericardial fluid.  The liver is unremarkable without contrast.  There are multiple gallstones in the gallbladder.  No sign of biliary obstruction or gross inflammation.  The spleen is unremarkable containing some small granulomas.  No lesion of the pancreas.  The adrenal glands are normal.  The kidneys show mild atrophy and some vascular calcification.  No cyst, mass, stone or hydronephrosis.  The aorta shows atherosclerotic change but no aneurysm.  The IVC is unremarkable. No retroperitoneal mass or adenopathy.  No free intraperitoneal fluid or air.  The appendix is normal.  Calcified uterine fibroid is again noted.  There is a small amount of air in the bladder, presumably due to previous catheterization.  No pelvic collection. Chronic degenerative and postsurgical changes effect the spine.  IMPRESSION: No acute finding.  Cholelithiasis without CT evidence of cholecystitis or obstruction.  Ultrasound suggested if there is clinical concern.  Pleural effusions right larger than the left with dependent atelectasis.  Coronary artery calcification.  Aortic calcification.  Original Report Authenticated By: Thomasenia Sales, M.D.   US Abdomen Complete  10/02/2011  *RADIOLOGY REPORT*  Clinical Data:  Right upper quadrant pain.  Evaluate for cholecystitis.  COMPLETE ABDOMINAL ULTRASOUND  Comparison:  10/01/2011 CT.  Findings:  Gallbladder:  Several small gallstones.  Difficult to state if these move as the patient was not able to move into the decubitus position.  No gallbladder wall thickening or pericholecystic fluid is noted.  The patient was not tender over this region during scanning.  Common bile duct:  4.4 mm.  Liver:  No focal lesion identified.  Within normal limits in parenchymal echogenicity.  IVC:  Appears normal.  Pancreas:  Slightly limited evaluation secondary to  overlying bowel gas.  Spleen:  Not well delineated secondary to patient's inability to move.  Right Kidney:  10.9 cm per No hydronephrosis or renal mass  Left Kidney:  10.6 cm. No hydronephrosis or renal mass.  Abdominal aorta:  Atherosclerotic type changes with maximal AP dimension of 1.8 cm.  IMPRESSION: Several small gallstones without gallbladder wall thickening or pericholecystic fluid.  The patient did not move to determine if stones move.  The patient was not tender over this region during scanning.  Pancreas and spleen suboptimally evaluated.  Original Report Authenticated By: Fuller Canada, M.D.    Medications: I have reviewed the patient's current medications.  1. Nausea and vomiting -Unclear etiology, Differential medications, Ileus, gastritis. CT abdomen negative for acute finding. Doubt worsening infection UA with less WBC than prior admission. Lipase negative, LFT normal. Per daughter prior history of gallstone but on exam no pain on RUQ and LFT norma.  [place] negative for cholecystitis. protonix increase to twice a day. Tolerating clears. Advance diet.   2. Recent admission for sepsis secondary to UTI and was found to have ESBL - urine cultures showed Escherichia coli sensitive to imipenem.  Continue with IV antibiotics until able to tolerates oral medications. UA will less WBC, follow urine culture. She received 7 days during prior admission. 10/10.   3. Uncontrolled hypertension probably related to nausea and vomiting - continue present home medications. Place patient on when necessary IV hydralazine for systolic blood pressure was 160.  4. History of CHF diastolic - Resume lasix.  5. Diabetes mellitus type 2 - resume lantus. 6. History of severe hyponatremia recently admitted last month and was on demeclocycline - Follow metabolic panel closely.  7-Hypokalemia: replace.  8-DVT prophylaxis: Lovenox.         LOS: 3 days   Breyona Swander M.D.  [facility] 10/03/2011,  1:25 PM

## 2011-10-03 NOTE — Progress Notes (Signed)
ANTIBIOTIC CONSULT NOTE - Initial  Pharmacy Consult for Primaxin Indication: UTI  No Known Allergies  Labs:  Basename 10/03/11 0535 10/01/11 0615 09/30/11 1710  WBC 7.9 8.0 10.1  HGB 11.4* 10.4* 11.2*  PLT 236 228 264  LABCREA -- -- --  CREATININE 0.74 0.81 0.82   Estimated Creatinine Clearance: 48.8 ml/min (by C-G formula based on Cr of 0.74). No results found for this basename: VANCOTROUGH:2,VANCOPEAK:2,VANCORANDOM:2,GENTTROUGH:2,GENTPEAK:2,GENTRANDOM:2,TOBRATROUGH:2,TOBRAPEAK:2,TOBRARND:2,AMIKACINPEAK:2,AMIKACINTROU:2,AMIKACIN:2, in the last 72 hours   Microbiology: Recent Results (from the past 720 hour(s))  URINE CULTURE     Status: Normal   Collection Time   09/04/11  3:16 AM      Component Value Range Status Comment   Specimen Description URINE, CATHETERIZED   Final    Special Requests NONE   Final    Culture  Setup Time [id number]   Final    Colony Count NO GROWTH   Final    Culture NO GROWTH   Final    Report Status 09/05/2011 FINAL   Final   URINE CULTURE     Status: Normal   Collection Time   09/22/11  6:12 AM      Component Value Range Status Comment   Specimen Description URINE, CATHETERIZED   Final    Special Requests NONE   Final    Culture  Setup Time 09/22/2011 12:53   Final    Colony Count >=100,000 COLONIES/ML   Final    Culture     Final    Value: ESCHERICHIA COLI     Note: Confirmed Extended Spectrum Beta-Lactamase Producer (ESBL) CRITICAL RESULT CALLED TO, READ BACK BY AND VERIFIED WITH: ZENEIDA@12 :40PM ON 09/24/11 BY DANTS   Report Status 09/24/2011 FINAL   Final    Organism ID, Bacteria ESCHERICHIA COLI   Final   MRSA PCR SCREENING     Status: Normal   Collection Time   09/22/11  8:43 AM      Component Value Range Status Comment   MRSA by PCR NEGATIVE  NEGATIVE Final   CULTURE, BLOOD (ROUTINE X 2)     Status: Normal   Collection Time   09/22/11 10:04 AM      Component Value Range Status Comment   Specimen Description BLOOD LEFT ARM   Final    Special Requests BOTTLES DRAWN AEROBIC ONLY 10CC   Final    Culture  Setup Time 09/23/2011 02:12   Final    Culture NO GROWTH 5 DAYS   Final    Report Status 09/29/2011 FINAL   Final   CULTURE, BLOOD (ROUTINE X 2)     Status: Normal   Collection Time   09/22/11 10:13 AM      Component Value Range Status Comment   Specimen Description BLOOD LEFT HAND   Final    Special Requests BOTTLES DRAWN AEROBIC ONLY [facility]   Final    Culture  Setup Time 09/23/2011 02:12   Final    Culture NO GROWTH 5 DAYS   Final    Report Status 09/29/2011 FINAL   Final     Anti-infectives     Start     Dose/Rate Route Frequency Ordered Stop   10/01/11 0100   imipenem-cilastatin (PRIMAXIN) 500 mg in sodium chloride 0.9 % 100 mL IVPB        500 mg 200 mL/hr over 30 Minutes Intravenous 3 times per day 10/01/11 0025     10/01/11 0022   demeclocycline (DECLOMYCIN) tablet 300 mg  300 mg Oral 2 times daily 10/01/11 0022     09/30/11 2230   imipenem-cilastatin (PRIMAXIN) 500 mg in sodium chloride 0.9 % 100 mL IVPB  Status:  Discontinued        500 mg 200 mL/hr over 30 Minutes Intravenous  Once 09/30/11 2228 10/01/11 0025          Assessment: Pt discharged earlier today 7.10 but on route to [facility] began to have nosebleed, nausea, HTN. Now readmitted. To continue primaxin- on day # 9 for ESBL E. Coli UTI. Pt is afebrile and last WBC was WNL. Estimated CrCl ~51ml/min so dose is appropriate.   Plan:  1. Continue primaxin 500mg  IV Q8H for now 2. Clarify length of therapy 3. Monitor renal function and adjust as appropriate   Okey Regal, PharmD [phone] 10/03/2011,11:08 AM

## 2011-10-03 NOTE — Progress Notes (Signed)
Inpatient Diabetes Program Recommendations  AACE/ADA: New Consensus Statement on Inpatient Glycemic Control (2009)  Target Ranges:  Prepandial:   less than 140 mg/dL      Peak postprandial:   less than 180 mg/dL (1-2 hours)      Critically ill patients:  140 - 180 mg/dL   Reason for Visit: CBGs 10/02/11  [phone] mg/dl       0/98/11  914-782 mg/dl  Inpatient Diabetes Program Recommendations Insulin - Basal: . Add at least 1/2 home basal insulin dose- Lantus 9 units Q HS & titrate as needed. Correction (SSI): Increase Novolog correction scale to MODERATE if CBGs conintue greater than 180 mg/dl  Note:

## 2011-10-04 LAB — GLUCOSE, CAPILLARY
Glucose-Capillary: 246 mg/dL — ABNORMAL HIGH (ref 70–99)
Glucose-Capillary: 227 mg/dL — ABNORMAL HIGH (ref 70–99)
Glucose-Capillary: 322 mg/dL — ABNORMAL HIGH (ref 70–99)

## 2011-10-04 NOTE — Progress Notes (Signed)
Subjective: Patient in bed, per nurse patient eat breakfast. Patient denies abdominal pain.  Objective: Filed Vitals:   10/03/11 1657 10/03/11 2029 10/04/11 0645 10/04/11 0907  BP: 169/63 156/63 185/65 182/65  Pulse: 65 65 65 63  Temp: 98.2 F (36.8 C) 98.6 F (37 C) 97.4 F (36.3 C) 98 F (36.7 C)  TempSrc: Oral Oral Oral Oral  Resp: 18 20 20 20   Height:      Weight:  77 kg (169 lb 12.1 oz)    SpO2: 95% 97% 92% 93%   Weight change: 0.4 kg (14.1 oz)   General: Alert, awake, oriented x3, in no acute distress.  HEENT: No bruits, no goiter.  Heart: Regular rate and rhythm, without murmurs, rubs, gallops.  Lungs: CTA, bilateral air movement.  Abdomen: Soft, nontender, nondistended, positive bowel sounds.  Extremities; no edema.    Lab Results:  [facility] 10/03/11 0535  NA 135  K 4.1  CL 87*  CO2 38*  GLUCOSE 235*  BUN 10  CREATININE 0.74  CALCIUM 8.8  MG --  PHOS --    Basename 10/03/11 0535  WBC 7.9  NEUTROABS --  HGB 11.4*  HCT 35.0*  MCV 93.3  PLT 236    Micro Results: Recent Results (from the past 240 hour(s))  URINE CULTURE     Status: Normal   Collection Time   10/02/11  5:22 PM      Component Value Range Status Comment   Specimen Description URINE, CATHETERIZED   Final    Special Requests NONE   Final    Culture  Setup Time 10/02/2011 19:20   Final    Colony Count NO GROWTH   Final    Culture NO GROWTH   Final    Report Status 10/03/2011 FINAL   Final     Studies/Results: US Abdomen Complete  10/02/2011  *RADIOLOGY REPORT*  Clinical Data:  Right upper quadrant pain.  Evaluate for cholecystitis.  COMPLETE ABDOMINAL ULTRASOUND  Comparison:  10/01/2011 CT.  Findings:  Gallbladder:  Several small gallstones.  Difficult to state if these move as the patient was not able to move into the decubitus position.  No gallbladder wall thickening or pericholecystic fluid is noted.  The patient was not tender over this region during scanning.  Common bile duct:   4.4 mm.  Liver:  No focal lesion identified.  Within normal limits in parenchymal echogenicity.  IVC:  Appears normal.  Pancreas:  Slightly limited evaluation secondary to overlying bowel gas.  Spleen:  Not well delineated secondary to patient's inability to move.  Right Kidney:  10.9 cm per No hydronephrosis or renal mass  Left Kidney:  10.6 cm. No hydronephrosis or renal mass.  Abdominal aorta:  Atherosclerotic type changes with maximal AP dimension of 1.8 cm.  IMPRESSION: Several small gallstones without gallbladder wall thickening or pericholecystic fluid.  The patient did not move to determine if stones move.  The patient was not tender over this region during scanning.  Pancreas and spleen suboptimally evaluated.  Original Report Authenticated By: Fuller Canada, M.D.    Medications: I have reviewed the patient's current medications.  1. Nausea and vomiting -Unclear etiology, Differential medications,  gastritis. CT abdomen negative for acute finding. Doubt worsening infection UA with less WBC than prior admission. Lipase negative, LFT normal. Per daughter prior history of gallstone but on exam no pain on RUQ and LFT norma. [place] negative for cholecystitis. protonix increase to twice a day. Tolerating regular diet.  2. Recent  admission for sepsis secondary to UTI and was found to have ESBL - urine cultures showed Escherichia coli sensitive to imipenem.  Continue with IV antibiotics until able to tolerates oral medications. UA will less WBC, repeated  urine culture no growth. She received 7 days during prior admission. 10/10. I will stop antibiotics.   3. Uncontrolled hypertension probably related to nausea and vomiting - continue present home medications. Place patient on when necessary IV hydralazine for systolic blood pressure was 160.  4. History of CHF diastolic - Resume lasix.  5. Diabetes mellitus type 2 - resume lantus.  6. History of severe hyponatremia recently admitted last month and was  on demeclocycline - Follow metabolic panel closely.  7-Hypokalemia: replace.  8-DVT prophylaxis: Lovenox.   Plan for discharge tomorrow.     LOS: 4 days   Sharissa Brierley M.D.  [facility] 10/04/2011, 10:31 AM

## 2011-10-05 DIAGNOSIS — K5900 Constipation, unspecified: Secondary | ICD-10-CM

## 2011-10-05 LAB — BASIC METABOLIC PANEL
BUN: 14 mg/dL (ref 6–23)
Calcium: 8.9 mg/dL (ref 8.4–10.5)
Creatinine, Ser: 0.83 mg/dL (ref 0.50–1.10)
GFR calc non Af Amer: 60 mL/min — ABNORMAL LOW (ref >90)
Glucose, Bld: 260 mg/dL — ABNORMAL HIGH (ref 70–99)
Sodium: 131 mEq/L — ABNORMAL LOW (ref 135–145)

## 2011-10-05 LAB — GLUCOSE, CAPILLARY: Glucose-Capillary: 261 mg/dL — ABNORMAL HIGH (ref 70–99)

## 2011-10-05 MED ORDER — SENNOSIDES-DOCUSATE SODIUM 8.6-50 MG PO TABS
1.0000 | ORAL_TABLET | Freq: Two times a day (BID) | ORAL | Status: DC
  Administered 2011-10-05 – 2011-10-06 (×3): 1 via ORAL
  Filled 2011-10-05 (×4): qty 1

## 2011-10-05 MED ORDER — LISINOPRIL 20 MG PO TABS
20.0000 mg | ORAL_TABLET | Freq: Every day | ORAL | Status: DC
  Administered 2011-10-05 – 2011-10-06 (×2): 20 mg via ORAL
  Filled 2011-10-05 (×2): qty 1

## 2011-10-05 MED ORDER — FLEET ENEMA 7-19 GM/118ML RE ENEM: 1.0000 | ENEMA | Freq: Once | RECTAL | Status: DC

## 2011-10-05 NOTE — Progress Notes (Signed)
Subjective: Patient lying in bed. No bowel movement. She is eating more. She denies to me abdominal pain. Son at bedside. Updated given. I also spoke with daughter over phone who was concern with increase BP.   Objective: Filed Vitals:   10/04/11 1625 10/04/11 2122 10/05/11 0554 10/05/11 0913  BP: 173/63 166/67 188/62 183/61  Pulse: 65 65 59 66  Temp: 98.4 F (36.9 C) 98.5 F (36.9 C) 98.1 F (36.7 C) 97.9 F (36.6 C)  TempSrc: Oral Oral Oral Oral  Resp: 20 20 18 17   Height:      Weight:      SpO2: 93% 95% 95% 96%   Weight change:    General: Alert, awake, oriented x3, in no acute distress.  HEENT: No bruits, no goiter.  Heart: Regular rate and rhythm, without murmurs, rubs, gallops.  Lungs: CTA, bilateral air movement.  Abdomen: Soft, nontender, nondistended, positive bowel sounds.  Extremities; no edema.    Lab Results:  [facility] 10/03/11 0535  NA 135  K 4.1  CL 87*  CO2 38*  GLUCOSE 235*  BUN 10  CREATININE 0.74  CALCIUM 8.8  MG --  PHOS --    Basename 10/03/11 0535  WBC 7.9  NEUTROABS --  HGB 11.4*  HCT 35.0*  MCV 93.3  PLT 236    Micro Results: Recent Results (from the past 240 hour(s))  URINE CULTURE     Status: Normal   Collection Time   10/02/11  5:22 PM      Component Value Range Status Comment   Specimen Description URINE, CATHETERIZED   Final    Special Requests NONE   Final    Culture  Setup Time 10/02/2011 19:20   Final    Colony Count NO GROWTH   Final    Culture NO GROWTH   Final    Report Status 10/03/2011 FINAL   Final     Studies/Results: No results found.  Medications: I have reviewed the patient's current medications.  1. Nausea and vomiting -Unclear etiology, Differential medications, gastritis. CT abdomen negative for acute finding. Doubt worsening infection UA with less WBC than prior admission. Lipase negative, LFT normal. Per daughter prior history of gallstone but on exam no pain on RUQ and LFT normal. [place] negative for  cholecystitis. protonix increase to twice a day. Tolerating regular diet.   2. Recent admission for sepsis secondary to UTI and was found to have ESBL - urine cultures showed Escherichia coli sensitive to imipenem.   UA will less WBC, repeated urine culture no growth. She received 7 days during prior admission. She completed 10/10 of imipenem. Antibiotics finished 7-13.   3. Uncontrolled hypertension probably related to nausea and vomiting - continue present home medications. Place patient on when necessary IV hydralazine for systolic blood pressure was 160. I will add lisinopril.  4. History of CHF diastolic - Continue with  lasix.  5. Diabetes mellitus type 2 - Continue with  lantus.  6. History of severe hyponatremia recently admitted last month and was on demeclocycline - Follow metabolic panel closely.  7-Hypokalemia: B-met pending.  8-DVT prophylaxis: Lovenox.  9-Constipation: I will give docusate. Enema.     LOS: 5 days   [name] M.D.  [facility] 10/05/2011, 10:44 AM

## 2011-10-06 LAB — GLUCOSE, CAPILLARY: Glucose-Capillary: 258 mg/dL — ABNORMAL HIGH (ref 70–99)

## 2011-10-06 MED ORDER — INSULIN GLARGINE 100 UNIT/ML ~~LOC~~ SOLN: 12.0000 [IU] | Freq: Every day | SUBCUTANEOUS | Status: DC

## 2011-10-06 MED ORDER — LISINOPRIL 20 MG PO TABS: 20.0000 mg | ORAL_TABLET | Freq: Every day | ORAL | Status: DC

## 2011-10-06 MED ORDER — PANTOPRAZOLE SODIUM 40 MG PO PACK: 40.0000 mg | PACK | Freq: Two times a day (BID) | ORAL | Status: DC

## 2011-10-06 NOTE — Discharge Summary (Signed)
Physician Discharge Summary  [name] ZOX:[id number] DOB: May 31, 1918 DOA: 09/30/2011  PCP: Colon Branch, MD  Admit date: 09/30/2011 Discharge date: 10/06/2011  Discharge Diagnoses:    Nausea and vomiting, Gastritis, medication, vs constipation.   Hypertension, uncontrolled.   DM II (diabetes mellitus, type II), controlled  UTI (lower urinary tract infection), E coli, finish treatment.   Constipation, resolved  Hyponatremia.   History of CHF diastolic  Diabetes mellitus type 2  Discharge Condition: Stable.  Disposition: Patient will need B-met in 3 days to follow sodium level. Adjust medications as needed. Need to make sure patient has Bowel movement, she is at risk for fecal impaction.     Diet: Carb Modified.   History of present illness:  78 year old female was discharged day prior to this admission after being treated for sepsis secondary to UTI and was found to have ESBL was brought back to the ER after patient was about to reach nursing home in the ambulance when patient started developing multiple episodes of nausea and vomiting and was found to have high blood pressure and developed epistaxis. By the time patient reached the ER the epistaxis has resolved. Patient did not have any further episodes of nausea vomiting but still feels uncomfortable. Denies any abdominal pain. The last time she moved her bowels was more than 48 hours ago. X-rays show distention of the small and large bowels. Patient will be admitted for further observation.   Hospital Course:  1. Nausea and vomiting -Unclear etiology, Differential medications, gastritis. CT abdomen negative for acute finding. Doubt worsening infection UA with less WBC than prior admission. Lipase negative, LFT normal. Per daughter prior history of gallstone but on exam no pain on RUQ and LFT normal. [place] negative for cholecystitis. protonix increase to twice a day. Tolerating regular diet.   2. Recent admission for sepsis secondary  to UTI and was found to have ESBL - urine cultures showed Escherichia coli sensitive to imipenem.  UA will less WBC, repeated urine culture no growth. She received 7 days during prior admission. She completed 10/10 of imipenem. Antibiotics finished 7-13.   3. Uncontrolled hypertension probably related to nausea and vomiting - continue present home medications.  I add lisinopril. Adjust medications as needed.  4. History of CHF diastolic - Continue with lasix.  5. Diabetes mellitus type 2 - Continue with lantus decrease dose to 12 units, patient eating less.  6. History of severe hyponatremia recently admitted last month and was on demeclocycline - Follow metabolic panel closely. Sodium at 131. Please repat B-met adjust medications as needed.  7-Hypokalemia: Resolved.  8-DVT prophylaxis: Lovenox.  9-Constipation: Patient with fecal impaction, resolved. She had a good BM. She is on bowel regimen.  Patient stable for discharge.    Discharge Exam: Filed Vitals:   10/05/11 2121  BP: 162/56  Pulse: 62  Temp: 98.3 F (36.8 C)  Resp: 18   Filed Vitals:   10/05/11 1315 10/05/11 1455 10/05/11 1735 10/05/11 2121  BP: 198/65 157/59 173/62 162/56  Pulse: 62 67 60 62  Temp: 97.8 F (36.6 C)  97.9 F (36.6 C) 98.3 F (36.8 C)  TempSrc: Oral  Oral Oral  Resp: 17 18 18 18   Height:      Weight:      SpO2: 95% 97% 97% 95%   General: Resting , calm, answer question one word, no distress.  Cardiovascular: S1, S2 RRR. Respiratory: CTA. Abdomen: soft, nt, nd, no rigidity.   Discharge Instructions  Discharge Orders  Future Orders Please Complete By Expires   Diet Carb Modified      Increase activity slowly        Medication List  As of 10/06/2011  8:46 AM   STOP taking these medications         sulfamethoxazole-trimethoprim 400-80 MG per tablet         TAKE these medications         acetaminophen 325 MG tablet   Commonly known as: TYLENOL   Take 650 mg by mouth every 4 (four)  hours as needed. For pain.      albuterol (5 MG/ML) 0.5% nebulizer solution   Commonly known as: PROVENTIL   Take 0.5 mLs (2.5 mg total) by nebulization every 2 (two) hours as needed for wheezing or shortness of breath.      aspirin 81 MG chewable tablet   Chew 81 mg by mouth daily.      beta carotene w/minerals tablet   Take 1 tablet by mouth daily.      bisacodyl 10 MG suppository   Commonly known as: DULCOLAX   Place 10 mg rectally daily as needed. For constipation      demeclocycline 150 MG tablet   Commonly known as: DECLOMYCIN   Take 2 tablets (300 mg total) by mouth 2 (two) times daily.      diltiazem 180 MG 24 hr capsule   Commonly known as: CARDIZEM CD   Take 180 mg by mouth daily.      feeding supplement Liqd   Take 237 mLs by mouth 3 (three) times daily between meals.      furosemide 40 MG tablet   Commonly known as: LASIX   Take 1 tablet (40 mg total) by mouth daily.      GLUCAGON EMERGENCY 1 MG injection   Generic drug: glucagon   Inject 1 mg into the vein once as needed. 1 mg injection IM      hydrALAZINE 100 MG tablet   Commonly known as: APRESOLINE   Take 1 tablet (100 mg total) by mouth every 8 (eight) hours.      insulin aspart 100 UNIT/ML injection   Commonly known as: novoLOG   Inject 4 Units into the skin 3 (three) times daily with meals.      insulin glargine 100 UNIT/ML injection   Commonly known as: LANTUS   Inject 12 Units into the skin at bedtime.      ipratropium 0.02 % nebulizer solution   Commonly known as: ATROVENT   Take 0.5 mg by nebulization every 2 (two) hours as needed. For shortness of breath.      lactulose 10 GM/15ML solution   Commonly known as: CHRONULAC   Take 20 g by mouth 2 (two) times daily.      levothyroxine 100 MCG tablet   Commonly known as: SYNTHROID, LEVOTHROID   Take 100 mcg by mouth daily before breakfast.      lisinopril 20 MG tablet   Commonly known as: PRINIVIL,ZESTRIL   Take 1 tablet (20 mg total) by  mouth daily.      LOPRESSOR 50 MG tablet   Generic drug: metoprolol   Take 50 mg by mouth 2 (two) times daily.      omeprazole 20 MG capsule   Commonly known as: PRILOSEC   Take 20 mg by mouth 2 (two) times daily.      pantoprazole sodium 40 mg/20 mL Pack   Commonly known as: PROTONIX   Place 20  mLs (40 mg total) into feeding tube 2 (two) times daily.      polyethylene glycol packet   Commonly known as: MIRALAX / GLYCOLAX   Take 17 g by mouth daily.      potassium chloride 10 MEQ tablet   Commonly known as: K-DUR   Take 10 mEq by mouth daily.      senna-docusate 8.6-50 MG per tablet   Commonly known as: Senokot-S   Take 1 tablet by mouth 2 (two) times daily.      simvastatin 20 MG tablet   Commonly known as: ZOCOR   Take 20 mg by mouth daily.              The results of significant diagnostics from this hospitalization (including imaging, microbiology, ancillary and laboratory) are listed below for reference.    Significant Diagnostic Studies: Ct Abdomen Pelvis Wo Contrast  10/01/2011  *RADIOLOGY REPORT*  Clinical Data: Nausea and vomiting.  Constipation.  Sepsis and UTI.  CT ABDOMEN AND PELVIS WITHOUT CONTRAST  Technique:  Multidetector CT imaging of the abdomen and pelvis was performed following the standard protocol without intravenous contrast.  Comparison: 06/22/2011  Findings: There is a small right pleural effusion layering dependently.  There is dependent pulmonary atelectasis bilaterally. Extensive coronary artery calcification is noted.  There is a tiny amount of layering pleural fluid on the left.  No pericardial fluid.  The liver is unremarkable without contrast.  There are multiple gallstones in the gallbladder.  No sign of biliary obstruction or gross inflammation.  The spleen is unremarkable containing some small granulomas.  No lesion of the pancreas.  The adrenal glands are normal.  The kidneys show mild atrophy and some vascular calcification.  No cyst,  mass, stone or hydronephrosis.  The aorta shows atherosclerotic change but no aneurysm.  The IVC is unremarkable. No retroperitoneal mass or adenopathy.  No free intraperitoneal fluid or air.  The appendix is normal.  Calcified uterine fibroid is again noted.  There is a small amount of air in the bladder, presumably due to previous catheterization.  No pelvic collection. Chronic degenerative and postsurgical changes effect the spine.  IMPRESSION: No acute finding.  Cholelithiasis without CT evidence of cholecystitis or obstruction.  Ultrasound suggested if there is clinical concern.  Pleural effusions right larger than the left with dependent atelectasis.  Coronary artery calcification.  Aortic calcification.  Original Report Authenticated By: Thomasenia Sales, M.D.   Dg Abd 1 View  09/30/2011  *RADIOLOGY REPORT*  Clinical Data: Epistaxis  ABDOMEN - 1 VIEW  Comparison: 09/26/2011  Findings: Generalized gaseous distention of small and large bowel is present.  Calcified uterine fibroids project over the pelvis. Osteopenia.  Degenerative change of the hip joints right greater than left.  No obvious free intraperitoneal gas.  IMPRESSION: Mild generalized bowel distention.  Original Report Authenticated By: Donavan Burnet, M.D.   Dg Abd 1 View  09/26/2011  *RADIOLOGY REPORT*  Clinical Data: Abdominal pain and distention.  ABDOMEN - 1 VIEW  Comparison: 08/14/2011.  Findings: On the supine image the small bowel pattern shows one loop of slightly dilated small intestine in the lower left abdomen. Colonic and rectal gas is present.  Air-fluid levels cannot be assessed without erect or decubitus examination.  No opaque calculi are seen.  Calcified uterine leiomyoma appear stable. Nonaneurysmal arterial calcifications are present.  There is osteopenic appearance of the bones.  There is moderate scoliosis convexity to the right.  Osteophytes are seen representing degenerative  spondylosis.  IMPRESSION:  Supine images were  submitted.  Bowel gas pattern is nonspecific and appears to be nonobstructive as can be determined on supine images. Air-fluid levels cannot be assessed on supine image.  Stable calcified uterine leiomyoma.  Nonaneurysmal arterial calcifications.  Chronic bony changes are detailed above.  Original Report Authenticated By: Crawford Givens, M.D.   Ct Head Wo Contrast  09/22/2011  *RADIOLOGY REPORT*  Clinical Data: Altered mental status  CT HEAD WITHOUT CONTRAST  Technique:  Contiguous axial images were obtained from the base of the skull through the vertex without contrast.  Comparison: 08/30/2011  Findings: Prominence of the sulci, cisterns, and ventricles, in keeping with volume loss. There are subcortical and periventricular white matter hypodensities, a nonspecific finding most often seen with chronic microangiopathic changes.  There is no evidence for acute hemorrhage, overt hydrocephalus, mass lesion, or abnormal extra-axial fluid collection.  No definite CT evidence for acute cortical based (large artery) infarction. Remote left sided infarction involving the centrum semiovale, corona radiata, and anterior limb internal capsule. Atherosclerotic vascular calcification. The visualized paranasal sinuses and mastoid air cells are predominately clear.  IMPRESSION: No appreciable interval change.  White matter hypodensities, remote left basal ganglia/white matter lacunar infarction, and volume loss.  Original Report Authenticated By: Waneta Martins, M.D.   US Abdomen Complete  10/02/2011  *RADIOLOGY REPORT*  Clinical Data:  Right upper quadrant pain.  Evaluate for cholecystitis.  COMPLETE ABDOMINAL ULTRASOUND  Comparison:  10/01/2011 CT.  Findings:  Gallbladder:  Several small gallstones.  Difficult to state if these move as the patient was not able to move into the decubitus position.  No gallbladder wall thickening or pericholecystic fluid is noted.  The patient was not tender over this region during scanning.   Common bile duct:  4.4 mm.  Liver:  No focal lesion identified.  Within normal limits in parenchymal echogenicity.  IVC:  Appears normal.  Pancreas:  Slightly limited evaluation secondary to overlying bowel gas.  Spleen:  Not well delineated secondary to patient's inability to move.  Right Kidney:  10.9 cm per No hydronephrosis or renal mass  Left Kidney:  10.6 cm. No hydronephrosis or renal mass.  Abdominal aorta:  Atherosclerotic type changes with maximal AP dimension of 1.8 cm.  IMPRESSION: Several small gallstones without gallbladder wall thickening or pericholecystic fluid.  The patient did not move to determine if stones move.  The patient was not tender over this region during scanning.  Pancreas and spleen suboptimally evaluated.  Original Report Authenticated By: Fuller Canada, M.D.   Dg Chest Port 1 View  09/30/2011  *RADIOLOGY REPORT*  Clinical Data: Hypertension.  Epistaxis.  PORTABLE CHEST - 1 VIEW  Comparison: 09/27/2011  Findings: Moderate cardiomegaly.  Under aerated lungs.  Left basilar atelectasis.  No pneumothorax.  IMPRESSION: Cardiomegaly.  Left basilar atelectasis.  Original Report Authenticated By: Donavan Burnet, M.D.   Dg Chest Port 1 View  09/27/2011  *RADIOLOGY REPORT*  Clinical Data: Shortness of breath.  PORTABLE CHEST - 1 VIEW 09/27/2011 1310 hours:  Comparison: Portable chest x-ray 09/22/2011, 08/30/2011, 08/18/2011, 06/21/2011.  Findings: Cardiac silhouette enlarged but stable.  Pulmonary venous hypertension with minimal interstitial pulmonary edema as evidenced by scattered Kerley B lines.  Small bilateral pleural effusions suspected.  Suboptimal inspiration accounts for mild bibasilar atelectasis.  No confluent airspace consolidation.  IMPRESSION: Stable cardiomegaly.  Minimal interstitial pulmonary edema and small bilateral pleural effusions consistent with minimal CHF.  Original Report Authenticated By: Arnell Sieving, M.D.  Dg Chest Portable 1 View  09/22/2011   *RADIOLOGY REPORT*  Clinical Data: Altered mental status  PORTABLE CHEST - 1 VIEW  Comparison: 09/04/2011  Findings: Hypoaeration results in interstitial and vascular crowding.  Cardiomegaly.  Lung bases are obscured by elevated hemidiaphragms.  Images further degraded by patient rotation. Interstitial prominence.  No focal consolidation visualized. No pneumothorax or large pleural effusion.  No acute osseous finding.  IMPRESSION: Degraded by hypoaeration as above.  Cardiomegaly and interstitial prominence without focal consolidation visualized.  Original Report Authenticated By: Waneta Martins, M.D.    Microbiology: Recent Results (from the past 240 hour(s))  URINE CULTURE     Status: Normal   Collection Time   10/02/11  5:22 PM      Component Value Range Status Comment   Specimen Description URINE, CATHETERIZED   Final    Special Requests NONE   Final    Culture  Setup Time 10/02/2011 19:20   Final    Colony Count NO GROWTH   Final    Culture NO GROWTH   Final    Report Status 10/03/2011 FINAL   Final      Labs: Basic Metabolic Panel:  Lab 10/05/11 0981 10/03/11 0535 10/01/11 0615 09/30/11 1710  NA 131* 135 137 135  K 4.2 4.1 -- --  CL 85* 87* 91* 90*  CO2 38* 38* 32 35*  GLUCOSE 260* 235* 189* 251*  BUN 14 10 11 11   CREATININE 0.83 0.74 0.81 0.82  CALCIUM 8.9 8.8 8.7 8.7  MG -- -- -- --  PHOS -- -- -- --   Liver Function Tests:  Lab 10/01/11 0615 09/30/11 1701  AST 14 23  ALT 10 13  ALKPHOS 94 106  BILITOT 0.3 0.2*  PROT 5.7* 6.5  ALBUMIN 2.7* 2.9*    Lab 09/30/11 1701  LIPASE 27  AMYLASE --   CBC:  Lab 10/03/11 0535 10/01/11 0615 09/30/11 1710  WBC 7.9 8.0 10.1  NEUTROABS -- 5.9 7.8*  HGB 11.4* 10.4* 11.2*  HCT 35.0* 32.5* 34.7*  MCV 93.3 92.9 92.8  PLT 236 228 264   CBG:  Lab 10/06/11 0739 10/05/11 2125 10/05/11 1702 10/05/11 1133 10/05/11 0734  GLUCAP 239* 294* 248* 261* 230*    Time coordinating discharge: 30 minutes.    SignedHartley Barefoot  [facility] 10/06/2011, 8:46 AM

## 2011-10-06 NOTE — Clinical Social Work Psychosocial (Signed)
Clinical Social Work Department BRIEF PSYCHOSOCIAL ASSESSMENT 10/06/2011  Patient:  [name], [name]     Account Number:  [id number]     Admit date:  09/30/2011  Clinical Social Worker:  ,  Date/Time:  10/06/2011 12:40 PM  Referred by:  RN  Date Referred:  10/06/2011 Referred for  SNF Placement   Other Referral:   Interview type:  Other - See comment Other interview type:   CSW talked with patient's bedside nurse, Admissions director at [facility], and patient's daughter who was at the bedside to advise that all was in readiness to call ambulance.    PSYCHOSOCIAL DATA Living Status:  FACILITY Admitted from facility:   Level of care:  Skilled Nursing Facility Primary support name:   Primary support relationship to patient:  CHILD, ADULT Degree of support available:   Jason Fila - daughter    CURRENT CONCERNS Current Concerns  Post-Acute Placement   Other Concerns:    SOCIAL WORK ASSESSMENT / PLAN Patient was d/c'd back to facility on 7/9, but was brought back to hospital by ambulance before reaching SNF. Patient ready for d/c today and daughter at bedside. Facility contacted, FL-2 completed and signed by MD, and d/c packet completed. Nurse and daughter advised and ambulance called.   Assessment/plan status:  No Further Intervention Required Other assessment/ plan:   Information/referral to community resources:    PATIENT'S/FAMILY'S RESPONSE TO PLAN OF CARE: Daughter appreciative of CSW working expeditiously to get patient's paperwork ready as she had an engagement at 1 pm and wanted patient discharged ASAP.

## 2011-10-06 NOTE — Clinical Social Work Note (Signed)
Patient medically stable for discharge back to [facility] skilled nursing facility. Discharge information forwarded to facility. Daughter advised that CSW ready to call ambulance and given clearance by RN. CSW facilitated transport to SNF via ambulance.  Genelle Bal, MSW, LCSW [phone]

## 2011-10-07 NOTE — Progress Notes (Signed)
Pt discharged to SNF after d/c summary reviewed and caregiver were capable of re verbalizing medications and follow up appointments. Pt remains stable. No signs and symptoms of distress. Educated to return to ER in the event of SOB, dizziness, chest pain, or fainting. Laverda Sorenson, RN

## 2012-04-10 ENCOUNTER — Emergency Department (HOSPITAL_COMMUNITY): Payer: Medicare Other

## 2012-04-10 ENCOUNTER — Encounter (HOSPITAL_COMMUNITY): Payer: Self-pay

## 2012-04-10 ENCOUNTER — Emergency Department (HOSPITAL_COMMUNITY)
Admission: EM | Admit: 2012-04-10 | Discharge: 2012-04-10 | Disposition: A | Discharge status: Skilled Nursing Facility | Payer: Medicare Other | Attending: Emergency Medicine | Admitting: Emergency Medicine

## 2012-04-10 DIAGNOSIS — J449 Chronic obstructive pulmonary disease, unspecified: Secondary | ICD-10-CM | POA: Insufficient documentation

## 2012-04-10 DIAGNOSIS — K219 Gastro-esophageal reflux disease without esophagitis: Secondary | ICD-10-CM | POA: Insufficient documentation

## 2012-04-10 DIAGNOSIS — Z8709 Personal history of other diseases of the respiratory system: Secondary | ICD-10-CM | POA: Insufficient documentation

## 2012-04-10 DIAGNOSIS — I69921 Dysphasia following unspecified cerebrovascular disease: Secondary | ICD-10-CM | POA: Insufficient documentation

## 2012-04-10 DIAGNOSIS — Z8673 Personal history of transient ischemic attack (TIA), and cerebral infarction without residual deficits: Secondary | ICD-10-CM | POA: Insufficient documentation

## 2012-04-10 DIAGNOSIS — F411 Generalized anxiety disorder: Secondary | ICD-10-CM | POA: Insufficient documentation

## 2012-04-10 DIAGNOSIS — Z8719 Personal history of other diseases of the digestive system: Secondary | ICD-10-CM | POA: Insufficient documentation

## 2012-04-10 DIAGNOSIS — Z794 Long term (current) use of insulin: Secondary | ICD-10-CM | POA: Insufficient documentation

## 2012-04-10 DIAGNOSIS — E039 Hypothyroidism, unspecified: Secondary | ICD-10-CM | POA: Insufficient documentation

## 2012-04-10 DIAGNOSIS — Z862 Personal history of diseases of the blood and blood-forming organs and certain disorders involving the immune mechanism: Secondary | ICD-10-CM | POA: Insufficient documentation

## 2012-04-10 DIAGNOSIS — I252 Old myocardial infarction: Secondary | ICD-10-CM | POA: Insufficient documentation

## 2012-04-10 DIAGNOSIS — J209 Acute bronchitis, unspecified: Secondary | ICD-10-CM | POA: Insufficient documentation

## 2012-04-10 DIAGNOSIS — Z87440 Personal history of urinary (tract) infections: Secondary | ICD-10-CM | POA: Insufficient documentation

## 2012-04-10 DIAGNOSIS — E876 Hypokalemia: Secondary | ICD-10-CM | POA: Insufficient documentation

## 2012-04-10 DIAGNOSIS — I2510 Atherosclerotic heart disease of native coronary artery without angina pectoris: Secondary | ICD-10-CM | POA: Insufficient documentation

## 2012-04-10 DIAGNOSIS — Z7982 Long term (current) use of aspirin: Secondary | ICD-10-CM | POA: Insufficient documentation

## 2012-04-10 DIAGNOSIS — Z79899 Other long term (current) drug therapy: Secondary | ICD-10-CM | POA: Insufficient documentation

## 2012-04-10 DIAGNOSIS — Z8639 Personal history of other endocrine, nutritional and metabolic disease: Secondary | ICD-10-CM | POA: Insufficient documentation

## 2012-04-10 DIAGNOSIS — J40 Bronchitis, not specified as acute or chronic: Secondary | ICD-10-CM

## 2012-04-10 DIAGNOSIS — F329 Major depressive disorder, single episode, unspecified: Secondary | ICD-10-CM | POA: Insufficient documentation

## 2012-04-10 DIAGNOSIS — Z8659 Personal history of other mental and behavioral disorders: Secondary | ICD-10-CM | POA: Insufficient documentation

## 2012-04-10 DIAGNOSIS — F3289 Other specified depressive episodes: Secondary | ICD-10-CM | POA: Insufficient documentation

## 2012-04-10 DIAGNOSIS — J4489 Other specified chronic obstructive pulmonary disease: Secondary | ICD-10-CM | POA: Insufficient documentation

## 2012-04-10 DIAGNOSIS — E119 Type 2 diabetes mellitus without complications: Secondary | ICD-10-CM | POA: Insufficient documentation

## 2012-04-10 DIAGNOSIS — I10 Essential (primary) hypertension: Secondary | ICD-10-CM | POA: Insufficient documentation

## 2012-04-10 DIAGNOSIS — Z8701 Personal history of pneumonia (recurrent): Secondary | ICD-10-CM | POA: Insufficient documentation

## 2012-04-10 LAB — BASIC METABOLIC PANEL
BUN: 13 mg/dL (ref 6–23)
Calcium: 9.3 mg/dL (ref 8.4–10.5)
Chloride: 97 mEq/L (ref 96–112)
Creatinine, Ser: 0.96 mg/dL (ref 0.50–1.10)
Potassium: 4 mEq/L (ref 3.5–5.1)
Sodium: 139 mEq/L (ref 135–145)

## 2012-04-10 LAB — CBC WITH DIFFERENTIAL/PLATELET
Basophils Relative: 1 % (ref 0–1)
Eosinophils Absolute: 0.1 10*3/uL (ref 0.0–0.7)
Hemoglobin: 11.8 g/dL — ABNORMAL LOW (ref 12.0–15.0)
Lymphs Abs: 2 10*3/uL (ref 0.7–4.0)
MCHC: 32.2 g/dL (ref 30.0–36.0)
Monocytes Relative: 7 % (ref 3–12)
Neutro Abs: 4.2 10*3/uL (ref 1.7–7.7)
Neutrophils Relative %: 61 % (ref 43–77)
Platelets: 194 10*3/uL (ref 150–400)
RBC: 3.91 MIL/uL (ref 3.87–5.11)

## 2012-04-10 MED ORDER — OSELTAMIVIR PHOSPHATE 75 MG PO CAPS: 75.0000 mg | ORAL_CAPSULE | Freq: Two times a day (BID) | ORAL | Status: DC

## 2012-04-10 MED ORDER — ALBUTEROL SULFATE (5 MG/ML) 0.5% IN NEBU
5.0000 mg | INHALATION_SOLUTION | Freq: Once | RESPIRATORY_TRACT | Status: AC
  Administered 2012-04-10: 5 mg via RESPIRATORY_TRACT
  Filled 2012-04-10: qty 1

## 2012-04-10 MED ORDER — LEVOFLOXACIN 500 MG PO TABS: 500.0000 mg | ORAL_TABLET | Freq: Every day | ORAL | Status: DC

## 2012-04-10 MED ORDER — PREDNISONE 20 MG PO TABS
60.0000 mg | ORAL_TABLET | Freq: Once | ORAL | Status: AC
  Administered 2012-04-10: 60 mg via ORAL
  Filled 2012-04-10: qty 3

## 2012-04-10 MED ORDER — IPRATROPIUM BROMIDE 0.02 % IN SOLN
0.5000 mg | Freq: Once | RESPIRATORY_TRACT | Status: AC
  Administered 2012-04-10: 0.5 mg via RESPIRATORY_TRACT
  Filled 2012-04-10: qty 2.5

## 2012-04-10 MED ORDER — PREDNISONE 20 MG PO TABS: 20.0000 mg | ORAL_TABLET | Freq: Two times a day (BID) | ORAL | Status: DC

## 2012-04-10 MED ORDER — LEVOFLOXACIN 500 MG PO TABS
500.0000 mg | ORAL_TABLET | Freq: Every day | ORAL | Status: DC
  Administered 2012-04-10: 500 mg via ORAL
  Filled 2012-04-10: qty 1

## 2012-04-10 NOTE — ED Notes (Signed)
WUJ:[id number]<JY> Expected date:04/10/12<BR> Expected time:12:04 PM<BR> Means of arrival:Ambulance<BR> Comments:<BR> Anxiety

## 2012-04-10 NOTE — ED Notes (Addendum)
Pt sts "I can't breathe very well."  NAD noted.  O2 Sat 97% on RA.  Denies pain.  Hx COPD and Pneumonia.

## 2012-04-10 NOTE — ED Provider Notes (Signed)
History     CSN: [id number]  Arrival date & time 04/10/12  1203   First MD Initiated Contact with Patient 04/10/12 1257      Chief Complaint  Patient presents with  . Shortness of Breath    (Consider location/radiation/quality/duration/timing/severity/associated sxs/prior treatment) HPI Comments: RAVONDA BRECHEEN is a 79 y.o. Female who's been complaining of shortness of breath, for 5-7 days. She chest x-ray done at her facility, which is reported to be normal. She is using nebulizers, when necessary, without significant relief. There is no reported fever, chills, nausea, vomiting, weakness, or dizziness. There are no other modifying factors.  Patient is a 79 y.o. female presenting with shortness of breath. The history is provided by the patient.  Shortness of Breath  Associated symptoms include shortness of breath.    Past Medical History  Diagnosis Date  . Dysphagia   . Diabetes mellitus   . Hypertension   . Hypothyroidism   . Hyperlipemia   . GERD (gastroesophageal reflux disease)   . Depression   . Anxiety   . Acute MI   . Pneumonia   . Stroke   . UTI (urinary tract infection)   . COPD (chronic obstructive pulmonary disease)   . CAD (coronary artery disease)   . Late effects of CVA (cerebrovascular accident)   . DEMENTIA   . Leukocytosis   . Anemia   . Hypokalemia   . Paralytic ileus   . Pleural effusion     Past Surgical History  Procedure Date  . Back surgery     Family History  Problem Relation Age of Onset  . Hypertension Sister   . Hypertension Brother     History  Substance Use Topics  . Smoking status: Never Smoker   . Smokeless tobacco: Never Used  . Alcohol Use: No    OB History    Grav Para Term Preterm Abortions TAB SAB Ect Mult Living                  Review of Systems  Respiratory: Positive for shortness of breath.   All other systems reviewed and are negative.    Allergies  Review of patient's allergies indicates no known  allergies.  Home Medications   Current Outpatient Rx  Name  Route  Sig  Dispense  Refill  . ALBUTEROL SULFATE (2.5 MG/3ML) 0.083% IN NEBU   Nebulization   Take 2.5 mg by nebulization every 6 (six) hours as needed.         . ALPRAZOLAM 0.25 MG PO TABS   Oral   Take 0.25 mg by mouth 2 (two) times daily as needed.         . ASPIRIN 81 MG PO CHEW   Oral   Chew 81 mg by mouth daily.         . OCUVITE PO TABS   Oral   Take 1 tablet by mouth daily.           . FUROSEMIDE 40 MG PO TABS   Oral   Take 40 mg by mouth 2 (two) times daily.         . INSULIN ASPART 100 UNIT/ML [place] SOLN   Subcutaneous   Inject 4 Units into the skin 3 (three) times daily with meals.          . INSULIN GLARGINE 100 UNIT/ML [place] SOLN   Subcutaneous   Inject 27 Units into the skin at bedtime.         Marland Kitchen  IPRATROPIUM BROMIDE 0.02 % IN SOLN   Nebulization   Take 0.5 mg by nebulization every 2 (two) hours as needed. For shortness of breath.         Marland Kitchen LACTULOSE 10 GM/15ML PO SOLN   Oral   Take 20 g by mouth 2 (two) times daily.         Marland Kitchen LEVOTHYROXINE SODIUM 100 MCG PO TABS   Oral   Take 100 mcg by mouth daily before breakfast.         . LISINOPRIL 20 MG PO TABS   Oral   Take 1 tablet (20 mg total) by mouth daily.   30 tablet   0   . OMEPRAZOLE 20 MG PO CPDR   Oral   Take 20 mg by mouth 2 (two) times daily.           Marland Kitchen ONDANSETRON HCL 4 MG PO TABS   Oral   Take 4 mg by mouth every 8 (eight) hours as needed. Nausea and vomiting         . POLYETHYLENE GLYCOL 3350 PO PACK   Oral   Take 17 g by mouth daily.           Marland Kitchen POTASSIUM CHLORIDE ER 10 MEQ PO TBCR   Oral   Take 10-20 mEq by mouth 2 (two) times daily. in the morning and 10 meq in the evening         . SENNOSIDES-DOCUSATE SODIUM 8.6-50 MG PO TABS   Oral   Take 1 tablet by mouth 2 (two) times daily.         Marland Kitchen SALINE NASAL SPRAY 0.65 % NA SOLN   Nasal   Place 1 spray into the nose 3 (three) times daily  as needed. Nasal congestion         . ZOLPIDEM TARTRATE 5 MG PO TABS   Oral   Take 5 mg by mouth at bedtime as needed.         Marland Kitchen LEVOFLOXACIN 500 MG PO TABS   Oral   Take 1 tablet (500 mg total) by mouth daily.   7 tablet   0   . PREDNISONE 20 MG PO TABS   Oral   Take 1 tablet (20 mg total) by mouth 2 (two) times daily.   10 tablet   0     BP 155/49  Pulse 76  Temp 98 F (36.7 C) (Oral)  Resp 18  SpO2 100%  Physical Exam  Nursing note and vitals reviewed. Constitutional: She is oriented to person, place, and time. She appears well-developed and well-nourished.  HENT:  Head: Normocephalic and atraumatic.  Eyes: Conjunctivae normal and EOM are normal. Pupils are equal, round, and reactive to light.  Neck: Normal range of motion and phonation normal. Neck supple.  Cardiovascular: Normal rate, regular rhythm and intact distal pulses.   Pulmonary/Chest: Effort normal. She exhibits no tenderness.       Decreased breath sounds bilaterally, left greater than right, with rales left base.  Abdominal: Soft. She exhibits no distension. There is no tenderness. There is no guarding.  Musculoskeletal: Normal range of motion. She exhibits no edema and no tenderness.  Neurological: She is alert and oriented to person, place, and time. She has normal strength. She exhibits normal muscle tone.  Skin: Skin is warm and dry.  Psychiatric: She has a normal mood and affect. Her behavior is normal. Judgment and thought content normal.    ED Course  Procedures (  including critical care time)   Emergency department treatment: Nebulizer, prednisone, Levaquin.   Reevaluation: 16:20- patient feels better. Repeat vital signs are normal.     Date: 01/09/2012  Rate: 71  Rhythm: normal sinus rhythm  QRS Axis: normal  PR and QT Intervals: normal  ST/T Wave abnormalities: nonspecific T wave changes  PR and QRS Conduction Disutrbances:none  Narrative Interpretation:   Old EKG Reviewed:  unchanged   Labs Reviewed  CBC WITH DIFFERENTIAL - Abnormal; Notable for the following:    Hemoglobin 11.8 (*)     All other components within normal limits  BASIC METABOLIC PANEL - Abnormal; Notable for the following:    CO2 33 (*)     Glucose, Bld 136 (*)     GFR calc non Af Amer 50 (*)     GFR calc Af Amer 58 (*)     All other components within normal limits   Dg Chest 2 View (if Patient Has Fever And/or Copd)  04/10/2012  *RADIOLOGY REPORT*  Clinical Data: Shortness of breath  CHEST - 2 VIEW  Comparison: Chest radiograph of 09/30/2011 and 06/26/2011  Findings: The lung volumes are low bilaterally.  Cardiomegaly is stable.  There is fairly heavy atherosclerotic calcification of the thoracic aorta.  Pulmonary vascularity appears within normal limits.  No focal airspace disease or pneumothorax.  Negative for pulmonary edema.  On the lateral view, the costophrenic angles appear slightly blunted, raising the possibility of trace pleural effusions.  This finding is not definite.  Atherosclerotic calcification of the right upper extremity vasculature noted.  IMPRESSION: 1. Very low lung volumes. 2.  Question trace bilateral pleural effusions. 3.  Mild cardiomegaly.  Heavy atherosclerotic calcification of thoracic aortic arch.   Original Report Authenticated By: Britta Mccreedy, M.D.    Nursing notes, applicable records and vitals reviewed.  Radiologic Images/Reports reviewed.   1. Bronchitis       MDM  Evaluation is consistent with uncomplicated. Bronchitis. Doubt pneumonia, ACS, PE. Doubt metabolic instability, serious bacterial infection or impending vascular collapse; the patient is stable for discharge.     Plan: Home Medications- Levaquin, prednisone; Home Treatments- rest; Recommended follow up- PCP, one week     Flint Melter, MD 04/10/12 [phone]

## 2012-04-10 NOTE — ED Notes (Signed)
EMS sts Pt, from [facility], c/o SOB and anxiety, per facility staff.  Pt given albuterol and xanax at facility.  Vitals are stable.  Pt continually on 3L [place].  Denies pain.  NAD noted.

## 2013-01-12 ENCOUNTER — Non-Acute Institutional Stay (SKILLED_NURSING_FACILITY): Payer: Medicare Other | Admitting: Internal Medicine

## 2013-01-12 DIAGNOSIS — I119 Hypertensive heart disease without heart failure: Secondary | ICD-10-CM

## 2013-01-12 DIAGNOSIS — I5033 Acute on chronic diastolic (congestive) heart failure: Secondary | ICD-10-CM

## 2013-01-12 DIAGNOSIS — E1149 Type 2 diabetes mellitus with other diabetic neurological complication: Secondary | ICD-10-CM

## 2013-01-12 NOTE — Progress Notes (Signed)
Patient ID: [name], female   DOB: [birth year], 79 y.o.   MRN: [id number] Facility; [facility] Chief complaint; transferred to our services History; the patient's that tells me she has been in this nursing home for many years although the last hospitalization date I see is from 09/30/2011 through 10/06/2011. As far as I can see she is stable and the facility. She is on chronic oxygen. Nonambulatory. Nevertheless the patient appears to be quite cognizant. She really has no particular complaints for me today.  Past Medical History  Diagnosis Date  . Dysphagia   . Diabetes mellitus   . Hypertension   . Hypothyroidism   . Hyperlipemia   . GERD (gastroesophageal reflux disease)   . Depression   . Anxiety   . Acute MI   . Pneumonia   . Stroke   . UTI (urinary tract infection)   . COPD (chronic obstructive pulmonary disease)   . CAD (coronary artery disease)   . Late effects of CVA (cerebrovascular accident)   . DEMENTIA   . Leukocytosis   . Anemia   . Hypokalemia   . Paralytic ileus   . Pleural effusion    . Past Surgical History  Procedure Laterality Date  . Back surgery     CHEST - 2 VIEW   Comparison: Chest radiograph of 09/30/2011 and 06/26/2011   Findings: The lung volumes are low bilaterally.  Cardiomegaly is stable.  There is fairly heavy atherosclerotic calcification of the thoracic aorta.  Pulmonary vascularity appears within normal limits.  No focal airspace disease or pneumothorax.  Negative for pulmonary edema.  On the lateral view, the costophrenic angles appear slightly blunted, raising the possibility of trace pleural effusions.  This finding is not definite.  Atherosclerotic calcification of the right upper extremity vasculature noted.   IMPRESSION: 1. Very low lung volumes. 2.  Question trace bilateral pleural effusions. 3.  Mild cardiomegaly.  Heavy atherosclerotic calcification of thoracic aortic arch.     Original Report Authenticated By:  Britta Mccreedy, M.D.       [facility]*               *[facility]                       [address]                     [place], [place] [address]                         [phone]   ------------------------------------------------------------ Transthoracic Echocardiography  Patient:    [name], [name] MR #:       [id number] Study Date: 08/15/2011 Gender:     F Age:        89 Height:     162.6cm Weight:     86.6kg BSA:        2.61m^2 Pt. Status: Room:       1413    PERFORMING   [place], [facility]  SONOGRAPHER  Cathie Beams, RCS  ORDERING     Doutova, Anastassia  ATTENDING    Richarda Overlie cc:  ------------------------------------------------------------ LV EF: 60% -   65%  ------------------------------------------------------------ Indications:   Previous study 04/13/2007.   Dyspnea 786.09.   ------------------------------------------------------------ History:   PMH:  Possible congestive heart failure.  ------------------------------------------------------------ Study Conclusions  - Procedure narrative: Transthoracic echocardiography. Image   quality was  adequate. The study was technically difficult. - Left ventricle: The cavity size was normal. Wall thickness   was normal. Systolic function was normal. The estimated   ejection fraction was in the range of 60% to 65%. Doppler   parameters are consistent with abnormal left ventricular   relaxation (grade 1 diastolic dysfunction). - Mitral valve: Mild regurgitation. Transthoracic echocardiography.  M-mode, complete 2D, spectral Doppler, and color Doppler.  Height:  Height: 162.6cm. Height: 64in.  Weight:  Weight: 86.6kg. Weight: 190.5lb.  Body mass index:  BMI: 32.8kg/m^2.  Body surface area:    BSA: 2.66m^2.  Blood pressure:     165/60.  Patient status:  Inpatient.  Location:   Bedside.  ------------------------------------------------------------  ------------------------------------------------------------ Left ventricle:  The cavity size was normal. Wall thickness was normal. Systolic function was normal. The estimated ejection fraction was in the range of 60% to 65%. Doppler parameters are consistent with abnormal left ventricular relaxation (grade 1 diastolic dysfunction).  ------------------------------------------------------------ Aortic valve:   Mildly thickened leaflets.  Doppler:   No regurgitation.  ------------------------------------------------------------ Mitral valve:   Mildly thickened leaflets .  Doppler:   Mild regurgitation.    Peak gradient: 7mm Hg (D).  ------------------------------------------------------------ Left atrium:  The atrium was mildly dilated.  ------------------------------------------------------------ Right ventricle:  The cavity size was normal. Wall thickness was normal. Systolic function was normal.  ------------------------------------------------------------ Pulmonic valve:   Poorly visualized.  ------------------------------------------------------------ Tricuspid valve:   Structurally normal valve.   Leaflet separation was normal.  Doppler:  Transvalvular velocity was within the normal range.  Trivial regurgitation.  ------------------------------------------------------------ Right atrium:  The atrium was normal in size.  ------------------------------------------------------------ Pericardium:  There was no pericardial effusion.  ------------------------------------------------------------  2D measurements        Normal  Doppler               Normal Left ventricle                 measurements LVID ED,     48 mm     43-52   Left ventricle chord,                         Ea, lat      5.72 cm/ ------- PLAX                           ann, tiss         s LVID ES,     34 mm     23-38   DP chord,                          E/Ea, lat    23.6     ------- PLAX                           ann, tiss FS,          29 %      >29     DP chord,                         Ea, med      5.65 cm/ ------- PLAX                           ann, tiss  s LVPW, ED     10 mm     ------  DP IVS/LVPW    0.9        <1.3    E/Ea, med   23.89     ------- ratio, ED                      ann, tiss Ventricular septum             DP IVS, ED       9 mm     ------  Mitral valve Aorta                          Peak E vel    135 cm/ ------- Root         30 mm     ------                    s diam, ED                       Peak A vel    146 cm/ ------- Left atrium                                      s AP dim    42.04 mm     ------  Deceleratio   197 ms  150-230 AP dim     2.09 cm/m^2 <2.2    n time index                          Peak            7 mm  -------                                gradient, D       Hg                                Peak E/A      0.9     -------                                ratio                                Right ventricle                                Sa vel, lat  12.6 cm/ -------                                ann, tiss         s                                DP     Current medications; Synthroid 100 mg daily, ASA 81 daily, MiraLax 17 g daily Micro-K 20  mEq daily, Senokot one tablet twice a day, Prilosec 20 mg daily Lasix 40 mg daily senna etc. 0.25 twice daily Flonase 50 mcg twice daily, Lantus 36 units subcutaneous daily and NovoLog 6 units subcutaneous a.c. meals  Socially; the patient had is a widow. 2 out of her 3 children are still living. One in [place] 1 -- she has a DO NOT RESUSCITATE in place History   Social History  . Marital Status: Widowed    Spouse Name: N/A    Number of Children: N/A  . Years of Education: N/A   Occupational History  . Not on file.   Social History Main Topics  . Smoking status: Never Smoker   . Smokeless tobacco: Never Used  . Alcohol Use: No   . Drug Use: No  . Sexual Activity: No   Other Topics Concern  . Not on file   Social History Narrative  . No narrative on file   Review of systems Respiratory has a loose cough feels she cannot mobilize mucus Cardiac no chest pain Abdomen; she has no abdominal pain no dysphagia Musculoskeletal; says she has not walked since breaking her toes Psychiatry; states she has "racing thoughts in her head'  Physical examination Gen. very pleasant elderly woman in no distress oxygen on. O2 sat 98% on 2 L, pulse rate 92 respirations 18 Respiratory shallow but otherwise clear entry bilateral Cardiac heart sounds are normal no murmurs Abdomen no liver no spleen no tenderness Extremities no edema Psychiatric; suspect there is some degree of depression here however I would like to get to know her better before considering her for additional pharmacologic therapy.  Impression/plan #1 type 2 diabetes he has not been a recent hemoglobin A1c lab work was last checked in June I will recheck this. #2 I am uncertain about her requirement for oxygen. She does not carry an underlying pulmonary diagnosis #3 diastolically mediated heart failure she is on Lasix 40 mg a day. I will recheck her lab #4 listed as having dementia I am not completely certain about this. She give a very coherent account of her self. She does have dementia it's probably mild #5 hypertension I am uncertain about the control here.

## 2013-01-26 ENCOUNTER — Non-Acute Institutional Stay (SKILLED_NURSING_FACILITY): Payer: Medicare Other | Admitting: Internal Medicine

## 2013-01-26 DIAGNOSIS — I5032 Chronic diastolic (congestive) heart failure: Secondary | ICD-10-CM

## 2013-01-26 DIAGNOSIS — R059 Cough, unspecified: Secondary | ICD-10-CM

## 2013-01-26 DIAGNOSIS — I509 Heart failure, unspecified: Secondary | ICD-10-CM

## 2013-01-26 DIAGNOSIS — R05 Cough: Secondary | ICD-10-CM

## 2013-01-26 DIAGNOSIS — E039 Hypothyroidism, unspecified: Secondary | ICD-10-CM

## 2013-01-29 DIAGNOSIS — R05 Cough: Secondary | ICD-10-CM | POA: Insufficient documentation

## 2013-01-29 NOTE — Progress Notes (Signed)
Patient ID: [name], female   DOB: Oct 30, 1918, 79 y.o.   MRN: [id number]  This is an acute visit.  Level of care skilled.  Facility [facility].  Chief complaint-acute visit secondary to non-productive cough.  History of present illness.  Patient is a very pleasant 79 year old female with a history of COPD on chronic oxygen-however this has been quite stable.  She says she has a cough that is mostly nonproductive she would like to get something that helps her   Cough up some phlegm that she feels she has.  She denies any shortness of breath fever chills says is just somewhat irritating she feels she can't get everything out.  Her vital signs appear to be stable 2 saturations continued to be in the 90s on chronic oxygen.  Family medical social history as been reviewed per admission note on 01/12/2013.  Medications have been reviewed per MAR.  Review of systems.  General denies any fever or chills.  Oropharynx-does not complaining of any sore throat.  Eyes-does not complaining of any visual changes or drainage or eye eructation.  Respiratory-does not complaining of shortness of breath says she has a cough that is relatively nonproductive as noted above.  Cardiac-no complaints of chest pain does have history of CHF.  Physical exam.  She is afebrile pulse is 72 respirations 16 blood pressure 120/64-O2 saturation continues in the 90s on chronic oxygen.  General this is a very pleasant elderly resident who looks somewhat younger than her stated age.  Her chest is clear to auscultation without any labored breathing--there is shallow air entry which I suspect is chronic.  Heart is regular rate and rhythm without murmur gallop or rub  Her oropharynx is clear mucous membranes are moist.  Eyes I do not note any drainage or exudate  Labs.  10/19/2012.  Sodium 140 potassium 4.3 BUN 15 creatinine 1.03.  08/20/2012.  WBC 6.6 hemoglobin 11.4 platelets  228.  Assessment and plan.  #1-cough-clinical exam appears to be baseline I do not hear anything real concerning here however she says she has trouble getting phlegm out will treat this with Mucinex 600 mg twice a day for 7 days and monitor with vital signs twice a day for 3 days with pulse ox to make sure this doesn't progress to something more sinister.  #2-history of CHF.--Diastolic-she is on Lasix with potassium we'll update labs to make sure her electrolytes are stable clinically she appears to be stable at this point.  #3  she has a history of hypothyroidism she is on Synthroid we'll update this level as well.  [id number]

## 2013-04-05 ENCOUNTER — Encounter (HOSPITAL_COMMUNITY): Payer: Self-pay | Admitting: Emergency Medicine

## 2013-04-05 ENCOUNTER — Inpatient Hospital Stay (HOSPITAL_COMMUNITY)
Admission: EM | Admit: 2013-04-05 | Discharge: 2013-04-24 | DRG: 871 | Disposition: E | Payer: Medicare Other | Attending: Internal Medicine | Admitting: Internal Medicine

## 2013-04-05 ENCOUNTER — Emergency Department (HOSPITAL_COMMUNITY): Payer: Medicare Other

## 2013-04-05 DIAGNOSIS — E785 Hyperlipidemia, unspecified: Secondary | ICD-10-CM | POA: Diagnosis present

## 2013-04-05 DIAGNOSIS — F411 Generalized anxiety disorder: Secondary | ICD-10-CM | POA: Diagnosis present

## 2013-04-05 DIAGNOSIS — J9600 Acute respiratory failure, unspecified whether with hypoxia or hypercapnia: Secondary | ICD-10-CM | POA: Diagnosis not present

## 2013-04-05 DIAGNOSIS — J189 Pneumonia, unspecified organism: Secondary | ICD-10-CM

## 2013-04-05 DIAGNOSIS — R0602 Shortness of breath: Secondary | ICD-10-CM

## 2013-04-05 DIAGNOSIS — G9341 Metabolic encephalopathy: Secondary | ICD-10-CM | POA: Diagnosis present

## 2013-04-05 DIAGNOSIS — Z794 Long term (current) use of insulin: Secondary | ICD-10-CM

## 2013-04-05 DIAGNOSIS — K219 Gastro-esophageal reflux disease without esophagitis: Secondary | ICD-10-CM | POA: Diagnosis present

## 2013-04-05 DIAGNOSIS — I10 Essential (primary) hypertension: Secondary | ICD-10-CM

## 2013-04-05 DIAGNOSIS — F0390 Unspecified dementia without behavioral disturbance: Secondary | ICD-10-CM | POA: Diagnosis present

## 2013-04-05 DIAGNOSIS — I5033 Acute on chronic diastolic (congestive) heart failure: Secondary | ICD-10-CM | POA: Diagnosis present

## 2013-04-05 DIAGNOSIS — N179 Acute kidney failure, unspecified: Secondary | ICD-10-CM | POA: Diagnosis present

## 2013-04-05 DIAGNOSIS — I2510 Atherosclerotic heart disease of native coronary artery without angina pectoris: Secondary | ICD-10-CM

## 2013-04-05 DIAGNOSIS — E119 Type 2 diabetes mellitus without complications: Secondary | ICD-10-CM | POA: Diagnosis present

## 2013-04-05 DIAGNOSIS — I469 Cardiac arrest, cause unspecified: Secondary | ICD-10-CM | POA: Diagnosis not present

## 2013-04-05 DIAGNOSIS — E875 Hyperkalemia: Secondary | ICD-10-CM | POA: Diagnosis present

## 2013-04-05 DIAGNOSIS — I5032 Chronic diastolic (congestive) heart failure: Secondary | ICD-10-CM

## 2013-04-05 DIAGNOSIS — I4891 Unspecified atrial fibrillation: Secondary | ICD-10-CM | POA: Diagnosis not present

## 2013-04-05 DIAGNOSIS — I6990 Unspecified sequelae of unspecified cerebrovascular disease: Secondary | ICD-10-CM

## 2013-04-05 DIAGNOSIS — J449 Chronic obstructive pulmonary disease, unspecified: Secondary | ICD-10-CM | POA: Diagnosis present

## 2013-04-05 DIAGNOSIS — R1310 Dysphagia, unspecified: Secondary | ICD-10-CM

## 2013-04-05 DIAGNOSIS — A419 Sepsis, unspecified organism: Principal | ICD-10-CM

## 2013-04-05 DIAGNOSIS — E039 Hypothyroidism, unspecified: Secondary | ICD-10-CM | POA: Diagnosis present

## 2013-04-05 DIAGNOSIS — I509 Heart failure, unspecified: Secondary | ICD-10-CM

## 2013-04-05 DIAGNOSIS — Z66 Do not resuscitate: Secondary | ICD-10-CM | POA: Diagnosis present

## 2013-04-05 DIAGNOSIS — J4489 Other specified chronic obstructive pulmonary disease: Secondary | ICD-10-CM

## 2013-04-05 DIAGNOSIS — I447 Left bundle-branch block, unspecified: Secondary | ICD-10-CM | POA: Diagnosis present

## 2013-04-05 DIAGNOSIS — T68XXXA Hypothermia, initial encounter: Secondary | ICD-10-CM | POA: Diagnosis present

## 2013-04-05 LAB — COMPREHENSIVE METABOLIC PANEL
ALBUMIN: 3.3 g/dL — AB (ref 3.5–5.2)
ALK PHOS: 68 U/L (ref 39–117)
ALT: 12 U/L (ref 0–35)
AST: 16 U/L (ref 0–37)
BILIRUBIN TOTAL: 0.3 mg/dL (ref 0.3–1.2)
BUN: 13 mg/dL (ref 6–23)
CHLORIDE: 94 meq/L — AB (ref 96–112)
CO2: 31 meq/L (ref 19–32)
CREATININE: 1.12 mg/dL — AB (ref 0.50–1.10)
Calcium: 8.8 mg/dL (ref 8.4–10.5)
GFR, EST AFRICAN AMERICAN: 48 mL/min — AB (ref >90)
GFR, EST NON AFRICAN AMERICAN: 41 mL/min — AB (ref >90)
GLUCOSE: 255 mg/dL — AB (ref 70–99)
POTASSIUM: 5.6 meq/L — AB (ref 3.7–5.3)
Sodium: 137 mEq/L (ref 137–147)
Total Protein: 6.5 g/dL (ref 6.0–8.3)

## 2013-04-05 LAB — URINALYSIS, ROUTINE W REFLEX MICROSCOPIC
BILIRUBIN URINE: NEGATIVE
Glucose, UA: NEGATIVE mg/dL
KETONES UR: NEGATIVE mg/dL
Leukocytes, UA: NEGATIVE
Nitrite: NEGATIVE
Protein, ur: 100 mg/dL — AB
SPECIFIC GRAVITY, URINE: 1.025 (ref 1.005–1.030)
UROBILINOGEN UA: 0.2 mg/dL (ref 0.0–1.0)
pH: 5 (ref 5.0–8.0)

## 2013-04-05 LAB — CBC WITH DIFFERENTIAL/PLATELET
Basophils Absolute: 0 10*3/uL (ref 0.0–0.1)
Basophils Relative: 0 % (ref 0–1)
Eosinophils Absolute: 0 10*3/uL (ref 0.0–0.7)
Eosinophils Relative: 0 % (ref 0–5)
HEMATOCRIT: 40.4 % (ref 36.0–46.0)
HEMOGLOBIN: 12.4 g/dL (ref 12.0–15.0)
LYMPHS ABS: 1.1 10*3/uL (ref 0.7–4.0)
LYMPHS PCT: 6 % — AB (ref 12–46)
MCH: 30.7 pg (ref 26.0–34.0)
MCHC: 30.7 g/dL (ref 30.0–36.0)
MCV: 100 fL (ref 78.0–100.0)
MONO ABS: 1.2 10*3/uL — AB (ref 0.1–1.0)
MONOS PCT: 7 % (ref 3–12)
NEUTROS ABS: 14.6 10*3/uL — AB (ref 1.7–7.7)
NEUTROS PCT: 86 % — AB (ref 43–77)
Platelets: 168 10*3/uL (ref 150–400)
RBC: 4.04 MIL/uL (ref 3.87–5.11)
RDW: 13.4 % (ref 11.5–15.5)
WBC: 16.9 10*3/uL — ABNORMAL HIGH (ref 4.0–10.5)

## 2013-04-05 LAB — URINE MICROSCOPIC-ADD ON

## 2013-04-05 LAB — CG4 I-STAT (LACTIC ACID): LACTIC ACID, VENOUS: 4.05 mmol/L — AB (ref 0.5–2.2)

## 2013-04-05 LAB — PRO B NATRIURETIC PEPTIDE: PRO B NATRI PEPTIDE: 6048 pg/mL — AB (ref 0–450)

## 2013-04-05 LAB — TROPONIN I

## 2013-04-05 MED ORDER — OSELTAMIVIR PHOSPHATE 75 MG PO CAPS
75.0000 mg | ORAL_CAPSULE | Freq: Once | ORAL | Status: AC
  Administered 2013-04-05: 75 mg via ORAL
  Filled 2013-04-05: qty 1

## 2013-04-05 MED ORDER — SODIUM CHLORIDE 0.9 % IJ SOLN: 3.0000 mL | INTRAMUSCULAR | Status: DC | PRN

## 2013-04-05 MED ORDER — SODIUM CHLORIDE 0.9 % IJ SOLN: 3.0000 mL | Freq: Two times a day (BID) | INTRAMUSCULAR | Status: DC

## 2013-04-05 MED ORDER — FUROSEMIDE 10 MG/ML IJ SOLN
40.0000 mg | Freq: Once | INTRAMUSCULAR | Status: AC
  Administered 2013-04-05: 40 mg via INTRAVENOUS
  Filled 2013-04-05: qty 4

## 2013-04-05 MED ORDER — OSELTAMIVIR PHOSPHATE 75 MG PO CAPS
75.0000 mg | ORAL_CAPSULE | Freq: Two times a day (BID) | ORAL | Status: DC
  Administered 2013-04-06: 75 mg via ORAL
  Filled 2013-04-05 (×3): qty 1

## 2013-04-05 MED ORDER — VANCOMYCIN HCL IN DEXTROSE 1-5 GM/200ML-% IV SOLN
1000.0000 mg | Freq: Once | INTRAVENOUS | Status: AC
  Administered 2013-04-06: 1000 mg via INTRAVENOUS
  Filled 2013-04-05: qty 200

## 2013-04-05 MED ORDER — PIPERACILLIN-TAZOBACTAM 3.375 G IVPB
3.3750 g | Freq: Three times a day (TID) | INTRAVENOUS | Status: DC
  Administered 2013-04-06 – 2013-04-07 (×4): 3.375 g via INTRAVENOUS
  Filled 2013-04-05 (×7): qty 50

## 2013-04-05 MED ORDER — ENOXAPARIN SODIUM 40 MG/0.4ML ~~LOC~~ SOLN
40.0000 mg | SUBCUTANEOUS | Status: DC
  Administered 2013-04-06: 40 mg via SUBCUTANEOUS
  Filled 2013-04-05 (×2): qty 0.4

## 2013-04-05 MED ORDER — VANCOMYCIN HCL IN DEXTROSE 1-5 GM/200ML-% IV SOLN
1000.0000 mg | INTRAVENOUS | Status: DC
  Administered 2013-04-06: 1000 mg via INTRAVENOUS
  Filled 2013-04-05 (×3): qty 200

## 2013-04-05 MED ORDER — SODIUM CHLORIDE 0.9 % IV SOLN: 250.0000 mL | INTRAVENOUS | Status: DC | PRN

## 2013-04-05 MED ORDER — SODIUM CHLORIDE 0.9 % IV SOLN
INTRAVENOUS | Status: AC
  Administered 2013-04-05: 1000 mL via INTRAVENOUS

## 2013-04-05 MED ORDER — PIPERACILLIN-TAZOBACTAM 3.375 G IVPB 30 MIN
3.3750 g | Freq: Once | INTRAVENOUS | Status: AC
  Administered 2013-04-05: 3.375 g via INTRAVENOUS
  Filled 2013-04-05: qty 50

## 2013-04-05 NOTE — Progress Notes (Signed)
ANTIBIOTIC CONSULT NOTE - INITIAL  Pharmacy Consult for vancomycin and Zosyn Indication: HCAP  No Known Allergies  Patient Measurements:   Adjusted Body Weight: Estimated weight - 75kg  Vital Signs: Temp: 94.2 F (34.6 C) (01/13 1936) Temp src: Rectal (01/13 1936) BP: 143/60 mmHg (01/13 2015) Pulse Rate: 85 (01/13 2015) Intake/Output from previous day:   Intake/Output from this shift:    Labs:  Recent Labs  04/10/2013 1818  WBC 16.9*  HGB 12.4  PLT 168  CREATININE 1.12*   The CrCl is unknown because both a height and weight (above a minimum accepted value) are required for this calculation. No results found for this basename: VANCOTROUGH, VANCOPEAK, VANCORANDOM, GENTTROUGH, GENTPEAK, GENTRANDOM, TOBRATROUGH, TOBRAPEAK, TOBRARND, AMIKACINPEAK, AMIKACINTROU, AMIKACIN,  in the last 72 hours   Microbiology: No results found for this or any previous visit (from the past 720 hour(s)).  Medical History: Past Medical History  Diagnosis Date  . Dysphagia   . Diabetes mellitus   . Hypertension   . Hypothyroidism   . Hyperlipemia   . GERD (gastroesophageal reflux disease)   . Depression   . Anxiety   . Acute MI   . Pneumonia   . Stroke   . UTI (urinary tract infection)   . COPD (chronic obstructive pulmonary disease)   . CAD (coronary artery disease)   . Late effects of CVA (cerebrovascular accident)   . DEMENTIA   . Leukocytosis   . Anemia   . Hypokalemia   . Paralytic ileus   . Pleural effusion     Medications:  See home med list Assessment: 80 year old woman admitted from SNF for mental status changes.  Chest x-ray shows possible pneumonia.  Empiric vancomycin and Zosyn to start.  Goal of Therapy:  Vancomycin trough level 15-20 mcg/ml  Plan:  Zosyn 3.375g IV q8h (infuse over 4 hours) Vancomycin 1g IV q24 Monitor cultures and renal function.  Will check a vancomycin trough if therapy to continue > 5 days or if renal function changes.  [name], [name]  [name] 03/30/2013,8:45 PM

## 2013-04-05 NOTE — ED Notes (Signed)
MD at bedside. 

## 2013-04-05 NOTE — H&P (Signed)
PCP:   Colon Branch, MD   Chief Complaint:  ams  HPI: 80 yo female h/o diastolic chf, copd comes in from snf for somnulence, ams.  Pt had her xanax dosing double in the last 2 days, dtr concerned over sedating.  Pt is sedated but arouses to voice easily, tachypneic.  Temp is 94.  dtr not present.  Per history from edp, no n/v/d.  Pt arouses to voice and appears comfortable.  She does deny any pain anywhere.  She is DNR.  Review of Systems:  Unobtainable  Past Medical History: Past Medical History  Diagnosis Date  . Dysphagia   . Diabetes mellitus   . Hypertension   . Hypothyroidism   . Hyperlipemia   . GERD (gastroesophageal reflux disease)   . Depression   . Anxiety   . Acute MI   . Pneumonia   . Stroke   . UTI (urinary tract infection)   . COPD (chronic obstructive pulmonary disease)   . CAD (coronary artery disease)   . Late effects of CVA (cerebrovascular accident)   . DEMENTIA   . Leukocytosis   . Anemia   . Hypokalemia   . Paralytic ileus   . Pleural effusion    Past Surgical History  Procedure Laterality Date  . Back surgery      Medications: Prior to Admission medications   Medication Sig Start Date End Date Taking? Authorizing Provider  acetaminophen (TYLENOL) 650 MG suppository Place 650 mg rectally every 4 (four) hours as needed for moderate pain.   Yes Historical Provider, MD  ALPRAZolam (XANAX) 0.25 MG tablet Take 0.25 mg by mouth 2 (two) times daily as needed for anxiety.    Yes Historical Provider, MD  ALPRAZolam Prudy Feeler) 0.5 MG tablet Take 0.5 mg by mouth 2 (two) times daily.   Yes Historical Provider, MD  aspirin 81 MG chewable tablet Chew 81 mg by mouth daily.   Yes Historical Provider, MD  beta carotene w/minerals (OCUVITE) tablet Take 1 tablet by mouth daily.     Yes Historical Provider, MD  fluticasone (FLONASE) 50 MCG/ACT nasal spray Place 2 sprays into both nostrils 2 (two) times daily.   Yes Historical Provider, MD  furosemide (LASIX) 40  MG tablet Take 40 mg by mouth 2 (two) times daily.   Yes Historical Provider, MD  insulin aspart (NOVOLOG) 100 UNIT/ML injection Inject 6 Units into the skin 3 (three) times daily with meals.    Yes Historical Provider, MD  insulin glargine (LANTUS) 100 UNIT/ML injection Inject 36 Units into the skin at bedtime.  10/06/11  Yes Belkys A Regalado, MD  levothyroxine (SYNTHROID, LEVOTHROID) 100 MCG tablet Take 100 mcg by mouth daily before breakfast.   Yes Historical Provider, MD  omeprazole (PRILOSEC) 20 MG capsule Take 20 mg by mouth 2 (two) times daily.     Yes Historical Provider, MD  polyethylene glycol (MIRALAX / GLYCOLAX) packet Take 17 g by mouth daily.     Yes Historical Provider, MD  potassium chloride (K-DUR) 10 MEQ tablet Take 20 mEq by mouth daily.    Yes Historical Provider, MD  senna-docusate (SENOKOT-S) 8.6-50 MG per tablet Take 1 tablet by mouth 2 (two) times daily.   Yes Historical Provider, MD    Allergies:  No Known Allergies  Social History:  reports that she has never smoked. She has never used smokeless tobacco. She reports that she does not drink alcohol or use illicit drugs.  Family History: Family History  Problem Relation Age  of Onset  . Hypertension Sister   . Hypertension Brother     Physical Exam: Filed Vitals:   06-28-2013 1900 06-28-2013 1936 06-28-2013 2000 06-28-2013 2015  BP: 135/74  138/61 143/60  Pulse: 82  84 85  Temp:  94.2 F (34.6 C)    TempSrc:  Rectal    Resp: 25  27 22   SpO2: 100%  100% 100%   General appearance: mild distress, slowed mentation and toxic Head: Normocephalic, without obvious abnormality, atraumatic Eyes: negative Nose: Nares normal. Septum midline. Mucosa normal. No drainage or sinus tenderness. Neck: no JVD and supple, symmetrical, trachea midline Lungs: diminished breath sounds bilaterally Heart: regular rate and rhythm, S1, S2 normal, no murmur, click, rub or gallop Abdomen: soft, non-tender; bowel sounds normal; no masses,   no organomegaly Extremities: extremities normal, atraumatic, no cyanosis or edema Pulses: 2+ and symmetric Skin: Skin color, texture, turgor normal. No rashes or lesions Neurologic: Mental status: sedated/somnulent Cranial nerves: normal MAE  Labs on Admission:   Recent Labs  06-28-2013 1818  NA 137  K 5.6*  CL 94*  CO2 31  GLUCOSE 255*  BUN 13  CREATININE 1.12*  CALCIUM 8.8    Recent Labs  06-28-2013 1818  AST 16  ALT 12  ALKPHOS 68  BILITOT 0.3  PROT 6.5  ALBUMIN 3.3*    Recent Labs  06-28-2013 1818  WBC 16.9*  NEUTROABS 14.6*  HGB 12.4  HCT 40.4  MCV 100.0  PLT 168    Recent Labs  06-28-2013 1833  TROPONINI <0.30   Radiological Exams on Admission: Dg Chest Port 1 View  2013-05-20   CLINICAL DATA:  Shortness breath.  COPD.  Diabetic.  Hypertensive.  EXAM: PORTABLE CHEST - 1 VIEW  COMPARISON:  04/10/2012.  FINDINGS: Asymmetric airspace disease suggestive of pulmonary edema. Infectious infiltrate, particularly in the left lung base, could not be excluded in the proper clinical setting.  No gross pneumothorax.  Cardiomegaly.  Calcified tortuous aorta.  Gas filled stomach.  IMPRESSION: Asymmetric airspace disease suggestive of pulmonary edema. Infectious infiltrate, particularly in the left lung base, could not be excluded in the proper clinical setting.  Cardiomegaly.  Calcified tortuous aorta.   Electronically Signed   By: Bridgett LarssonSteve  Olson M.D.   On: 02015-02-27 16:27    Assessment/Plan 80 yo female with hypothermia, encephalopathy, leukocytosis, tachypnia likely septic from underlying HCAP with recent increase in her xanax dosing  Principal Problem:   Sepsis-  Place bear hugger.  Give vanc/zosyn to cover for aspiration.  Also tamiflu.  Flu is pending.  cxr has some edema, and ?pna, will not aggressively treat with ivf unless absolutely necessary.  Given one dose of lasix iv in ED already, will hold of on any further diuretics for tonight.  Blood cultures pending.  ua  looks clean.  bp stable and o2 sats good at this time.  Place in stepdown overnight.    Active Problems:   SOB (shortness of breath) as above   Metabolic encephalopathy  Unclear if due to sepsis or recent xanax changes, likely both.  Hold sedatives at this time.   COPD (chronic obstructive pulmonary disease) stable   CAD (coronary artery disease) stable   Diastolic CHF, chronic-grade I  stable   PNA (pneumonia)  As above   Hypothermia  As above.  Pt is DNR/I  Gabor Lusk A 2013-05-20, 8:37 PM

## 2013-04-05 NOTE — ED Notes (Signed)
Patient arrived via [facility]. Staff states patient is not her normal. She is weak and hard to keep awake. Patient was given Xanax 0.5mg  and Xanax 0.25mg  today. VSS. EKG showed NSR with occasional PVC's. CBG 355. Patient is oxygen dependent on 2L [place].

## 2013-04-05 NOTE — ED Provider Notes (Signed)
CSN: [id number]     Arrival date & time 2013-08-13  1554 History   First MD Initiated Contact with Patient 02015-05-23 1558     Chief Complaint  Patient presents with  . Weakness   (Consider location/radiation/quality/duration/timing/severity/associated sxs/prior Treatment) HPI Comments: Patient presents to the ER for evaluation of mental status changes. Patient comes to the ER by ambulance from a skilled nursing facility. At arrival to the ER, patient is somnolent but does awaken to voice and answer questions. Patient does not have any specific complaints. Denies chest pain, abdominal pain, shortness of breath.  Patient is a 80 y.o. female presenting with weakness.  Weakness    Past Medical History  Diagnosis Date  . Dysphagia   . Diabetes mellitus   . Hypertension   . Hypothyroidism   . Hyperlipemia   . GERD (gastroesophageal reflux disease)   . Depression   . Anxiety   . Acute MI   . Pneumonia   . Stroke   . UTI (urinary tract infection)   . COPD (chronic obstructive pulmonary disease)   . CAD (coronary artery disease)   . Late effects of CVA (cerebrovascular accident)   . DEMENTIA   . Leukocytosis   . Anemia   . Hypokalemia   . Paralytic ileus   . Pleural effusion    Past Surgical History  Procedure Laterality Date  . Back surgery     Family History  Problem Relation Age of Onset  . Hypertension Sister   . Hypertension Brother    History  Substance Use Topics  . Smoking status: Never Smoker   . Smokeless tobacco: Never Used  . Alcohol Use: No   OB History   Grav Para Term Preterm Abortions TAB SAB Ect Mult Living                 Review of Systems  Neurological: Positive for weakness.  Psychiatric/Behavioral: Positive for confusion.  All other systems reviewed and are negative.    Allergies  Review of patient's allergies indicates no known allergies.  Home Medications   Current Outpatient Rx  Name  Route  Sig  Dispense  Refill  . acetaminophen  (TYLENOL) 650 MG suppository   Rectal   Place 650 mg rectally every 4 (four) hours as needed for moderate pain.         Marland Kitchen. ALPRAZolam (XANAX) 0.25 MG tablet   Oral   Take 0.25 mg by mouth 2 (two) times daily as needed for anxiety.          . ALPRAZolam (XANAX) 0.5 MG tablet   Oral   Take 0.5 mg by mouth 2 (two) times daily.         Marland Kitchen. aspirin 81 MG chewable tablet   Oral   Chew 81 mg by mouth daily.         . beta carotene w/minerals (OCUVITE) tablet   Oral   Take 1 tablet by mouth daily.           . fluticasone (FLONASE) 50 MCG/ACT nasal spray   Each Nare   Place 2 sprays into both nostrils 2 (two) times daily.         . furosemide (LASIX) 40 MG tablet   Oral   Take 40 mg by mouth 2 (two) times daily.         . insulin aspart (NOVOLOG) 100 UNIT/ML injection   Subcutaneous   Inject 6 Units into the skin 3 (three) times daily with meals.          .Marland Kitchen  insulin glargine (LANTUS) 100 UNIT/ML injection   Subcutaneous   Inject 36 Units into the skin at bedtime.          Marland Kitchen levothyroxine (SYNTHROID, LEVOTHROID) 100 MCG tablet   Oral   Take 100 mcg by mouth daily before breakfast.         . omeprazole (PRILOSEC) 20 MG capsule   Oral   Take 20 mg by mouth 2 (two) times daily.           . polyethylene glycol (MIRALAX / GLYCOLAX) packet   Oral   Take 17 g by mouth daily.           . potassium chloride (K-DUR) 10 MEQ tablet   Oral   Take 20 mEq by mouth daily.          Marland Kitchen senna-docusate (SENOKOT-S) 8.6-50 MG per tablet   Oral   Take 1 tablet by mouth 2 (two) times daily.          BP 150/62  Pulse 85  Resp 24  SpO2 99% Physical Exam  Constitutional: She is oriented to person, place, and time. She appears well-developed and well-nourished. No distress.  HENT:  Head: Normocephalic and atraumatic.  Right Ear: Hearing normal.  Left Ear: Hearing normal.  Nose: Nose normal.  Mouth/Throat: Oropharynx is clear and moist and mucous membranes are  normal.  Eyes: Conjunctivae and EOM are normal. Pupils are equal, round, and reactive to light.  Neck: Normal range of motion. Neck supple.  Cardiovascular: Regular rhythm, S1 normal and S2 normal.  Exam reveals no gallop and no friction rub.   No murmur heard. Pulmonary/Chest: Tachypnea noted. No respiratory distress. She has decreased breath sounds. She has rales. She exhibits no tenderness.  Abdominal: Soft. Normal appearance and bowel sounds are normal. There is no hepatosplenomegaly. There is no tenderness. There is no rebound, no guarding, no tenderness at McBurney's point and negative Murphy's sign. No hernia.  Musculoskeletal: Normal range of motion. She exhibits edema.  Neurological: She is alert and oriented to person, place, and time. She has normal strength. No cranial nerve deficit or sensory deficit. Coordination normal. GCS eye subscore is 4. GCS verbal subscore is 5. GCS motor subscore is 6.  Skin: Skin is warm, dry and intact. No rash noted. No cyanosis.  Psychiatric: She has a normal mood and affect. Her speech is normal and behavior is normal. Thought content normal.    ED Course  Procedures (including critical care time) Labs Review Labs Reviewed  CBC WITH DIFFERENTIAL - Abnormal; Notable for the following:    WBC 16.9 (*)    Neutrophils Relative % 86 (*)    Neutro Abs 14.6 (*)    Lymphocytes Relative 6 (*)    Monocytes Absolute 1.2 (*)    All other components within normal limits  URINALYSIS, ROUTINE W REFLEX MICROSCOPIC - Abnormal; Notable for the following:    APPearance CLOUDY (*)    Hgb urine dipstick TRACE (*)    Protein, ur 100 (*)    All other components within normal limits  URINE MICROSCOPIC-ADD ON - Abnormal; Notable for the following:    Bacteria, UA MANY (*)    All other components within normal limits  CG4 I-STAT (LACTIC ACID) - Abnormal; Notable for the following:    Lactic Acid, Venous 4.05 (*)    All other components within normal limits   COMPREHENSIVE METABOLIC PANEL  PRO B NATRIURETIC PEPTIDE  TROPONIN I   Imaging Review Dg Chest  Port 1 View  04/21/2013   CLINICAL DATA:  Shortness breath.  COPD.  Diabetic.  Hypertensive.  EXAM: PORTABLE CHEST - 1 VIEW  COMPARISON:  04/10/2012.  FINDINGS: Asymmetric airspace disease suggestive of pulmonary edema. Infectious infiltrate, particularly in the left lung base, could not be excluded in the proper clinical setting.  No gross pneumothorax.  Cardiomegaly.  Calcified tortuous aorta.  Gas filled stomach.  IMPRESSION: Asymmetric airspace disease suggestive of pulmonary edema. Infectious infiltrate, particularly in the left lung base, could not be excluded in the proper clinical setting.  Cardiomegaly.  Calcified tortuous aorta.   Electronically Signed   By: Bridgett Larsson M.D.   On: 2013-04-21 16:27    EKG Interpretation    Date/Time:  Tuesday April 21, 2013 16:11:08 EST Ventricular Rate:  92 PR Interval:  159 QRS Duration: 130 QT Interval:  421 QTC Calculation: 521 R Axis:   -30 Text Interpretation:  Sinus rhythm Left bundle branch block non-specific IVC now progressed to LBBB Confirmed by Evone Arseneau  MD, Charlet Harr ([id number]) on 04-21-2013 4:40:07 PM            MDM  Diagnosis: 1. Mental status changes 2. Congestive heart failure 3. Uncontrolled diabetes  Presents to the ER from nursing home for evaluation of acute mental status changes. Patient is more somnolent than usual. Patient arrival is sleeping but does awaken to voice. She answers questions and has a nonfocal neurologic exam, other than confusion. The in her records reveals that the patient recently had her Xanax dosing increased. She was previously getting 0.25 mg of Xanax changed to 0.5. From the [facility], there is question of the possibility of her actually 0.5 plus the 0.25 as well. Account for the patient's sedation.  Upon arrival, she was noted to be short of breath. She denies shortness of breath, but did have increased  work of breathing and had diffuse rales. Her BNP is elevated. Chest x-ray is consistent with pulmonary edema, although pneumonia cannot be completely ruled out.  Patient will be admitted to internal medicine for treatment of congestive heart failure and monitoring her mental status.    Gilda Crease, MD 04/21/2013 1949

## 2013-04-05 NOTE — ED Notes (Signed)
Pt is easily arousable now, open eyes when daughter called her name. With draws to pain and follows simple command. Pt daughter stated she is usually awake, alert and communicate needs, pt uses wheelchair,  she non ambulatory.

## 2013-04-05 NOTE — ED Notes (Signed)
NOTIFIED DR. POLINA IN PERSON OF PATIENTS LAB RESULTS OF CG4 LACTIC ACID , Nov 15, 2013.

## 2013-04-05 NOTE — ED Notes (Signed)
Attempted to draw blood unsuccessful, Lab called.

## 2013-04-06 ENCOUNTER — Inpatient Hospital Stay (HOSPITAL_COMMUNITY): Payer: Medicare Other

## 2013-04-06 LAB — INFLUENZA PANEL BY PCR (TYPE A & B)
H1N1 flu by pcr: NOT DETECTED
INFLAPCR: NEGATIVE
Influenza B By PCR: NEGATIVE

## 2013-04-06 LAB — CBC
HEMATOCRIT: 38.7 % (ref 36.0–46.0)
Hemoglobin: 11.8 g/dL — ABNORMAL LOW (ref 12.0–15.0)
MCH: 30.6 pg (ref 26.0–34.0)
MCHC: 30.5 g/dL (ref 30.0–36.0)
MCV: 100.3 fL — AB (ref 78.0–100.0)
PLATELETS: 190 10*3/uL (ref 150–400)
RBC: 3.86 MIL/uL — AB (ref 3.87–5.11)
RDW: 13.4 % (ref 11.5–15.5)
WBC: 13.6 10*3/uL — ABNORMAL HIGH (ref 4.0–10.5)

## 2013-04-06 LAB — BLOOD GAS, ARTERIAL
Acid-Base Excess: 7.8 mmol/L — ABNORMAL HIGH (ref 0.0–2.0)
Acid-Base Excess: 11.5 mmol/L — ABNORMAL HIGH (ref 0.0–2.0)
BICARBONATE: 36.8 meq/L — AB (ref 20.0–24.0)
BICARBONATE: 39.1 meq/L — AB (ref 20.0–24.0)
DELIVERY SYSTEMS: POSITIVE
Drawn by: 32526
Drawn by: 10006
EXPIRATORY PAP: 6
FIO2: 0.4 %
Inspiratory PAP: 14
Mode: POSITIVE
O2 Content: 4 L/min
O2 Saturation: 99.1 %
O2 Saturation: 95 %
PATIENT TEMPERATURE: 98.6
PCO2 ART: 0 mmHg — AB (ref 35.0–45.0)
PCO2 ART: 97.1 mmHg — AB (ref 35.0–45.0)
PH ART: 7.127 — AB (ref 7.350–7.450)
PH ART: 7.229 — AB (ref 7.350–7.450)
PO2 ART: 144 mmHg — AB (ref 80.0–100.0)
PO2 ART: 72.8 mmHg — AB (ref 80.0–100.0)
Patient temperature: 98.6
TCO2: 40.4 mmol/L (ref 0–100)
TCO2: 42.1 mmol/L (ref 0–100)

## 2013-04-06 LAB — GLUCOSE, CAPILLARY
GLUCOSE-CAPILLARY: 214 mg/dL — AB (ref 70–99)
GLUCOSE-CAPILLARY: 224 mg/dL — AB (ref 70–99)
Glucose-Capillary: 177 mg/dL — ABNORMAL HIGH (ref 70–99)
Glucose-Capillary: 182 mg/dL — ABNORMAL HIGH (ref 70–99)
Glucose-Capillary: 208 mg/dL — ABNORMAL HIGH (ref 70–99)
Glucose-Capillary: 218 mg/dL — ABNORMAL HIGH (ref 70–99)

## 2013-04-06 LAB — BASIC METABOLIC PANEL
BUN: 15 mg/dL (ref 6–23)
BUN: 16 mg/dL (ref 6–23)
CALCIUM: 8.9 mg/dL (ref 8.4–10.5)
CHLORIDE: 91 meq/L — AB (ref 96–112)
CO2: 34 mEq/L — ABNORMAL HIGH (ref 19–32)
CO2: 38 meq/L — AB (ref 19–32)
CREATININE: 1.11 mg/dL — AB (ref 0.50–1.10)
CREATININE: 1.28 mg/dL — AB (ref 0.50–1.10)
Calcium: 8.9 mg/dL (ref 8.4–10.5)
Chloride: 95 mEq/L — ABNORMAL LOW (ref 96–112)
GFR calc Af Amer: 48 mL/min — ABNORMAL LOW (ref >90)
GFR calc Af Amer: 41 mL/min — ABNORMAL LOW (ref >90)
GFR calc non Af Amer: 42 mL/min — ABNORMAL LOW (ref >90)
GFR calc non Af Amer: 35 mL/min — ABNORMAL LOW (ref >90)
Glucose, Bld: 216 mg/dL — ABNORMAL HIGH (ref 70–99)
Glucose, Bld: 207 mg/dL — ABNORMAL HIGH (ref 70–99)
Potassium: 5.9 mEq/L — ABNORMAL HIGH (ref 3.7–5.3)
Potassium: 5.6 mEq/L — ABNORMAL HIGH (ref 3.7–5.3)
Sodium: 137 mEq/L (ref 137–147)
Sodium: 141 mEq/L (ref 137–147)

## 2013-04-06 LAB — CBC WITH DIFFERENTIAL/PLATELET
BASOS ABS: 0 10*3/uL (ref 0.0–0.1)
Basophils Relative: 0 % (ref 0–1)
Eosinophils Absolute: 0 10*3/uL (ref 0.0–0.7)
Eosinophils Relative: 0 % (ref 0–5)
HEMATOCRIT: 40.1 % (ref 36.0–46.0)
HEMOGLOBIN: 12.3 g/dL (ref 12.0–15.0)
LYMPHS ABS: 1.4 10*3/uL (ref 0.7–4.0)
LYMPHS PCT: 9 % — AB (ref 12–46)
MCH: 30.4 pg (ref 26.0–34.0)
MCHC: 30.7 g/dL (ref 30.0–36.0)
MCV: 99 fL (ref 78.0–100.0)
Monocytes Absolute: 1.1 10*3/uL — ABNORMAL HIGH (ref 0.1–1.0)
Monocytes Relative: 7 % (ref 3–12)
Neutro Abs: 12.6 10*3/uL — ABNORMAL HIGH (ref 1.7–7.7)
Neutrophils Relative %: 84 % — ABNORMAL HIGH (ref 43–77)
Platelets: 212 10*3/uL (ref 150–400)
RBC: 4.05 MIL/uL (ref 3.87–5.11)
RDW: 13.4 % (ref 11.5–15.5)
WBC: 15.1 10*3/uL — AB (ref 4.0–10.5)

## 2013-04-06 LAB — STREP PNEUMONIAE URINARY ANTIGEN: Strep Pneumo Urinary Antigen: NEGATIVE

## 2013-04-06 LAB — LEGIONELLA ANTIGEN, URINE: Legionella Antigen, Urine: NEGATIVE

## 2013-04-06 LAB — MRSA PCR SCREENING: MRSA by PCR: NEGATIVE

## 2013-04-06 LAB — CREATININE, SERUM
Creatinine, Ser: 1.12 mg/dL — ABNORMAL HIGH (ref 0.50–1.10)
GFR calc Af Amer: 48 mL/min — ABNORMAL LOW (ref >90)
GFR calc non Af Amer: 41 mL/min — ABNORMAL LOW (ref >90)

## 2013-04-06 LAB — TSH: TSH: 0.419 u[IU]/mL (ref 0.350–4.500)

## 2013-04-06 MED ORDER — DEXTROSE 50 % IV SOLN: 25.0000 mL | INTRAVENOUS | Status: DC | PRN

## 2013-04-06 MED ORDER — ONDANSETRON HCL 4 MG/2ML IJ SOLN
4.0000 mg | Freq: Four times a day (QID) | INTRAMUSCULAR | Status: DC | PRN
  Administered 2013-04-06: 4 mg via INTRAVENOUS
  Filled 2013-04-06: qty 2

## 2013-04-06 MED ORDER — GLUCOSE 40 % PO GEL
1.0000 | ORAL | Status: DC | PRN
Start: 2013-04-06 — End: 2013-04-06

## 2013-04-06 MED ORDER — DEXTROSE 50 % IV SOLN: 50.0000 mL | INTRAVENOUS | Status: DC | PRN

## 2013-04-06 MED ORDER — SODIUM CHLORIDE 0.9 % IV SOLN
INTRAVENOUS | Status: DC
  Administered 2013-04-06: 125 mL/h via INTRAVENOUS
  Administered 2013-04-07: 20 mL/h via INTRAVENOUS

## 2013-04-06 MED ORDER — FUROSEMIDE 10 MG/ML IJ SOLN
80.0000 mg | Freq: Two times a day (BID) | INTRAMUSCULAR | Status: DC
  Administered 2013-04-06 – 2013-04-07 (×2): 80 mg via INTRAVENOUS
  Filled 2013-04-06 (×5): qty 8

## 2013-04-06 MED ORDER — INSULIN ASPART 100 UNIT/ML ~~LOC~~ SOLN
0.0000 [IU] | SUBCUTANEOUS | Status: DC
  Administered 2013-04-06: 3 [IU] via SUBCUTANEOUS
  Administered 2013-04-06: 2 [IU] via SUBCUTANEOUS
  Administered 2013-04-06 (×3): 3 [IU] via SUBCUTANEOUS
  Administered 2013-04-06: 2 [IU] via SUBCUTANEOUS
  Administered 2013-04-07: 7 [IU] via SUBCUTANEOUS
  Administered 2013-04-07: 2 [IU] via SUBCUTANEOUS
  Administered 2013-04-07: 5 [IU] via SUBCUTANEOUS
  Administered 2013-04-07: 2 [IU] via SUBCUTANEOUS
  Administered 2013-04-07 (×2): 3 [IU] via SUBCUTANEOUS
  Administered 2013-04-07: 2 [IU] via SUBCUTANEOUS
  Administered 2013-04-08: 7 [IU] via SUBCUTANEOUS
  Administered 2013-04-08: 2 [IU] via SUBCUTANEOUS

## 2013-04-06 MED ORDER — LEVOTHYROXINE SODIUM 100 MCG IV SOLR
50.0000 ug | Freq: Every day | INTRAVENOUS | Status: DC
  Administered 2013-04-06 – 2013-04-07 (×2): 50 ug via INTRAVENOUS
  Filled 2013-04-06 (×4): qty 5

## 2013-04-06 NOTE — Progress Notes (Signed)
Inpatient Diabetes Program Recommendations  AACE/ADA: New Consensus Statement on Inpatient Glycemic Control (2013)  Target Ranges:  Prepandial:   less than 140 mg/dL      Peak postprandial:   less than 180 mg/dL (1-2 hours)      Critically ill patients:  140 - 180 mg/dL     Results for [name], [name] (MRN [id number]) as of 04/06/2013 14:10  Ref. Range 04/06/2013 00:06 04/06/2013 04:21 04/06/2013 08:25 04/06/2013 11:41  Glucose-Capillary Latest Range: 70-99 mg/dL 409208 (H) 811214 (H) 914177 (H) 182 (H)    **Per records, patient takes the following insulin at the SNF: Lantus 36 units QHS Novolog 6 units tid with meals   **MD- Please consider starting 1/3 patient's home Lantus dose- Lantus 12 units QHS   Will follow. Ambrose FinlandJeannine Johnston Parissa Chiao RN, MSN, CDE Diabetes Coordinator Inpatient Diabetes Program Team Pager: [phone] (8a-10p)

## 2013-04-06 NOTE — Care Management Note (Signed)
    Page 1 of 1   04/06/2013     12:55:22 PM   CARE MANAGEMENT NOTE 04/06/2013  Patient:  [name],[name]   Account Number:  [id number]  Date Initiated:  04/06/2013  Documentation initiated by:  Jerrell Mangel  Subjective/Objective Assessment:   adm with dx of sepsis; [facility]     In-house referral  Clinical Social Worker      DC Associate Professorlanning Services  CM consult      Per UR Regulation:  Reviewed for med. necessity/level of care/duration of stay

## 2013-04-06 NOTE — Progress Notes (Signed)
SLP Cancellation Note  Patient Details Name: Gladis RiffleMildred R Mohl MRN: [id number] DOB: 04/12/1918   Cancelled treatment:        Spoke with Dr. Sharon SellerMcClung following chart review.  Pt has hx dysphagia, however, it appears to be primarily esophageal in nature.  Pt has had esophageal dilation (2002), and esophageal study revealing mod dysmotility, no stricture (2010).  No ST intervention (neither BSE nor MBS) found in EPIC.  MD indicated desire for SLP to hold evaluation at this time until he has evaluated her.  Will await MD direction.  Celia B. Murvin NatalBueche, Town Center Asc LLCMSP, CCC-SLP [phone]  Leigh AuroraBueche, Celia Brown 04/06/2013, 9:41 AM

## 2013-04-06 NOTE — Progress Notes (Signed)
STAT ABG result relayed to MD.

## 2013-04-06 NOTE — Progress Notes (Signed)
Progress Note [facility] TEAM 1 - Stepdown/ICU TEAM   Roberta RiffleMildred R [name] ZOX:[id number] DOB: 06/27/1918 DOA: Apr 12, 2013 PCP: Colon BranchQURESHI, AYYAZ, MD  Admit HPI / Brief Narrative: 80 yo female w/ a h/o diastolic chf and copd who was transported from her snf for somnolence/ams. Pt had her xanax dosing doubled in the 2 days prior to her admit. Pt was sedated but aroused to voice easily, She was tachypneic. Temp was 94.   HPI/Subjective: The patient remains lethargic.  She is tachypnea.  Her respiratory effort appears insufficient with shallow breaths.  She is unable to provide a history.  Her daughter is at the bedside.  Assessment/Plan:  Sepsis (hypothermia/tachycardia/WBC 16) Flu negative - UA not c/w UTI - CXR not definitive, but raises question of LLL infiltrate   LLL HCAP v/s Aspiration PNA The patient is in clear respiratory distress at the present time though she does not appear to be uncomfortable - I will slow IV fluid resuscitation - we will gently diurese and adjust supplemental oxygen as indicated  Altered mental status / obtundation  Likely due to benzos plus impaired respiratory status - avoid sedatives - obtain ABG to determine if BIPAP would be helpful   Acute renal failure  Baseline GFR ~55 as of Jan 2014 - GFR at admit ~45 - attempts to hydrate have led to further respiratory distress therefore will diurese - follow renal function  Hyperkalemia Hopefully diuresis will help - no acute EKG changes at this time - patient does have left bundle branch block but this appears to be gradual progression from previously noted intraventricular conduction delay  COPD No evidence of acute bronchospasm  Dementia   Chronic diastolic CHF  EF normal via TTE May 2013  DM CBG modestly elevated - follow without change in treatment today given n.p.o. status  HTN  Hypothyroid  HLD  Anxiety D/O  Code Status: NO CODE Family Communication: Spoke with daughter at bedside at  length Disposition Plan: Remain in step down unit due to respiratory distress and possible need for BiPAP  Consultants: none  Procedures: none  Antibiotics: Zosyn 1/13 >> Vancomycin 1/13 >>  DVT prophylaxis: SQ heparin  Objective: Blood pressure 129/48, pulse 100, temperature 97.4 F (36.3 C), temperature source Oral, resp. rate 32, height 5\' 4"  (1.626 m), weight 78.4 kg (172 lb 13.5 oz), SpO2 99.00%.  Intake/Output Summary (Last 24 hours) at 04/06/13 1627 Last data filed at 04/06/13 1520  Gross per 24 hour  Intake 1685.42 ml  Output    300 ml  Net 1385.42 ml   Exam: General: Tachypnea Lungs: Poor air movement in left base - tachypnea - shallow respirations Cardiovascular: Tachycardic but regular without gallop or appreciable murmur Abdomen: Nontender, nondistended, soft, bowel sounds positive, no rebound, no ascites, no appreciable mass Extremities: No significant cyanosis, clubbing, or edema bilateral lower extremities  Data Reviewed: Basic Metabolic Panel:  Recent Labs Lab 08/28/2013 1818 04/06/13 0027 04/06/13 0322 04/06/13 1154  NA 137  --  137 141  K 5.6*  --  5.9* 5.6*  CL 94*  --  91* 95*  CO2 31  --  34* 38*  GLUCOSE 255*  --  216* 207*  BUN 13  --  15 16  CREATININE 1.12* 1.12* 1.11* 1.28*  CALCIUM 8.8  --  8.9 8.9   Liver Function Tests:  Recent Labs Lab 08/28/2013 1818  AST 16  ALT 12  ALKPHOS 68  BILITOT 0.3  PROT 6.5  ALBUMIN 3.3*   CBC:  Recent Labs Lab 04/07/2013 1818 04/06/13 0027 04/06/13 0322  WBC 16.9* 13.6* 15.1*  NEUTROABS 14.6*  --  12.6*  HGB 12.4 11.8* 12.3  HCT 40.4 38.7 40.1  MCV 100.0 100.3* 99.0  PLT 168 190 212   Cardiac Enzymes:  Recent Labs Lab 03/27/2013 1833  TROPONINI <0.30   BNP (last 3 results)  Recent Labs  04/19/2013 1833  PROBNP 6048.0*   CBG:  Recent Labs Lab 04/06/13 0006 04/06/13 0421 04/06/13 0825 04/06/13 1141 04/06/13 1540  GLUCAP 208* 214* 177* 182* 218*    Recent Results (from  the past 240 hour(s))  MRSA PCR SCREENING     Status: None   Collection Time    04/06/2013 10:52 PM      Result Value Range Status   MRSA by PCR NEGATIVE  NEGATIVE Final   Comment:            The GeneXpert MRSA Assay (FDA     approved for NASAL specimens     only), is one component of a     comprehensive MRSA colonization     surveillance program. It is not     intended to diagnose MRSA     infection nor to guide or     monitor treatment for     MRSA infections.     Studies:  Recent x-ray studies have been reviewed in detail by the Attending Physician  Scheduled Meds:  Scheduled Meds: . furosemide  80 mg Intravenous Q12H  . insulin aspart  0-9 Units Subcutaneous Q4H  . piperacillin-tazobactam (ZOSYN)  IV  3.375 g Intravenous Q8H  . vancomycin  1,000 mg Intravenous Q24H    Time spent on care of this patient: 35 mins   [facility] T  [facility] Office  [phone] Pager - Text Page per Loretha Stapler as per below:  On-Call/Text Page:      [email or web address]      password [email or web address]  If 7PM-7AM, please contact night-coverage [email or web address] Password [email or web address] 04/06/2013, 4:27 PM   LOS: 1 day

## 2013-04-06 NOTE — Plan of Care (Cosign Needed)
Chart,labs and VS reviewed. Have asked SLP for eval since known dysphagia and current NPO diet order IVFs have expired so will reorder noting K elevated on 300 am labs. Will obtain BMET at 12 pm. Pt has preserved LV systolic fnx per ECHO 2013 but has DD1 so watch IVFs. Flu PCR neg so will dc TAMIFLU  Junious SilkAllison Anael Rosch, ANP

## 2013-04-07 DIAGNOSIS — I5033 Acute on chronic diastolic (congestive) heart failure: Secondary | ICD-10-CM

## 2013-04-07 DIAGNOSIS — R1310 Dysphagia, unspecified: Secondary | ICD-10-CM

## 2013-04-07 LAB — BLOOD GAS, ARTERIAL
ACID-BASE EXCESS: 10.8 mmol/L — AB (ref 0.0–2.0)
BICARBONATE: 37.1 meq/L — AB (ref 20.0–24.0)
DRAWN BY: 10006
Delivery systems: POSITIVE
Expiratory PAP: 6
FIO2: 0.4 %
INSPIRATORY PAP: 14
Mode: POSITIVE
O2 Saturation: 95 %
PCO2 ART: 75.1 mmHg — AB (ref 35.0–45.0)
PO2 ART: 74.1 mmHg — AB (ref 80.0–100.0)
Patient temperature: 98.6
TCO2: 39.4 mmol/L (ref 0–100)
pH, Arterial: 7.315 — ABNORMAL LOW (ref 7.350–7.450)

## 2013-04-07 LAB — BASIC METABOLIC PANEL
BUN: 23 mg/dL (ref 6–23)
CHLORIDE: 94 meq/L — AB (ref 96–112)
CO2: 35 meq/L — AB (ref 19–32)
Calcium: 8.8 mg/dL (ref 8.4–10.5)
Creatinine, Ser: 1.76 mg/dL — ABNORMAL HIGH (ref 0.50–1.10)
GFR calc Af Amer: 28 mL/min — ABNORMAL LOW (ref >90)
GFR calc non Af Amer: 24 mL/min — ABNORMAL LOW (ref >90)
GLUCOSE: 232 mg/dL — AB (ref 70–99)
POTASSIUM: 6.1 meq/L — AB (ref 3.7–5.3)
Sodium: 140 mEq/L (ref 137–147)

## 2013-04-07 LAB — GLUCOSE, CAPILLARY
GLUCOSE-CAPILLARY: 174 mg/dL — AB (ref 70–99)
GLUCOSE-CAPILLARY: 209 mg/dL — AB (ref 70–99)
GLUCOSE-CAPILLARY: 249 mg/dL — AB (ref 70–99)
Glucose-Capillary: 175 mg/dL — ABNORMAL HIGH (ref 70–99)
Glucose-Capillary: 187 mg/dL — ABNORMAL HIGH (ref 70–99)
Glucose-Capillary: 249 mg/dL — ABNORMAL HIGH (ref 70–99)
Glucose-Capillary: 268 mg/dL — ABNORMAL HIGH (ref 70–99)
Glucose-Capillary: 339 mg/dL — ABNORMAL HIGH (ref 70–99)

## 2013-04-07 LAB — CBC
HEMATOCRIT: 39.7 % (ref 36.0–46.0)
HEMOGLOBIN: 11.8 g/dL — AB (ref 12.0–15.0)
MCH: 30.2 pg (ref 26.0–34.0)
MCHC: 29.7 g/dL — ABNORMAL LOW (ref 30.0–36.0)
MCV: 101.5 fL — AB (ref 78.0–100.0)
Platelets: 195 10*3/uL (ref 150–400)
RBC: 3.91 MIL/uL (ref 3.87–5.11)
RDW: 13.4 % (ref 11.5–15.5)
WBC: 12.9 10*3/uL — ABNORMAL HIGH (ref 4.0–10.5)

## 2013-04-07 LAB — URINE CULTURE
COLONY COUNT: NO GROWTH
CULTURE: NO GROWTH

## 2013-04-07 LAB — POTASSIUM: Potassium: 4.9 mEq/L (ref 3.7–5.3)

## 2013-04-07 MED ORDER — DILTIAZEM LOAD VIA INFUSION
10.0000 mg | Freq: Once | INTRAVENOUS | Status: AC
  Administered 2013-04-07: 10 mg via INTRAVENOUS
  Filled 2013-04-07: qty 10

## 2013-04-07 MED ORDER — VANCOMYCIN HCL IN DEXTROSE 1-5 GM/200ML-% IV SOLN
1000.0000 mg | INTRAVENOUS | Status: DC
  Filled 2013-04-07: qty 200

## 2013-04-07 MED ORDER — ALPRAZOLAM 0.25 MG PO TABS
0.2500 mg | ORAL_TABLET | Freq: Two times a day (BID) | ORAL | Status: DC | PRN
  Administered 2013-04-07: 0.25 mg via ORAL
  Filled 2013-04-07 (×2): qty 1

## 2013-04-07 MED ORDER — DILTIAZEM HCL 100 MG IV SOLR
5.0000 mg/h | INTRAVENOUS | Status: DC
  Administered 2013-04-07: 5 mg/h via INTRAVENOUS
  Administered 2013-04-08: 15 mg/h via INTRAVENOUS
  Filled 2013-04-07 (×3): qty 100

## 2013-04-07 MED ORDER — PANTOPRAZOLE SODIUM 40 MG IV SOLR
40.0000 mg | INTRAVENOUS | Status: DC
  Administered 2013-04-07: 40 mg via INTRAVENOUS
  Filled 2013-04-07 (×2): qty 40

## 2013-04-07 MED ORDER — FUROSEMIDE 10 MG/ML IJ SOLN
20.0000 mg | Freq: Two times a day (BID) | INTRAMUSCULAR | Status: DC
  Administered 2013-04-07 – 2013-04-08 (×2): 20 mg via INTRAVENOUS
  Filled 2013-04-07 (×2): qty 2

## 2013-04-07 MED ORDER — PIPERACILLIN-TAZOBACTAM IN DEX 2-0.25 GM/50ML IV SOLN
2.2500 g | Freq: Four times a day (QID) | INTRAVENOUS | Status: DC
  Administered 2013-04-07 – 2013-04-08 (×4): 2.25 g via INTRAVENOUS
  Filled 2013-04-07 (×6): qty 50

## 2013-04-07 NOTE — Progress Notes (Addendum)
Progress Note [place] TEAM 1 - Stepdown/ICU TEAM   Gladis RiffleMildred R King UJW:[id number] DOB: 04/30/1918 DOA: Apr 30, 2013 PCP: Colon BranchQURESHI, AYYAZ, MD  Admit HPI / Brief Narrative: 80 yo female w/ a h/o diastolic chf and copd who was transported from her snf for somnolence/ams. Pt had her xanax dosing doubled in the 2 days prior to her admit. Pt was sedated but aroused to voice easily, She was tachypneic. Temp was 94.   HPI/Subjective: Pt alert but speech non-sensical. Called by Rn as pt begging for food. I spoke with the daughter who has given her water and states she is not aspiration- explained the risks of silent aspiration. Daughter is frustrated in regards to waiting until tomorrow for a SLP eval. We have decided to allow nectar thick liquids for tonight noting that she still is at a risk for aspiration. Pt eats mechanical soft and does not follow a carb modified diet as her PO intake is poor.   Assessment/Plan:  Sepsis (hypothermia/tachycardia/WBC 16) Flu negative - UA not c/w UTI - CXR not definitive, but raises question of LLL infiltrate   LLL HCAP v/s Aspiration PNA - improving- able to come off BiPAP this AM- will follow in SDU over night  - SLP eval pending- as mentioned above, start mechanical soft with nectar thick liquids with the knowledge that she is still a risk for aspiration  Obtunded/ hypercarbic resp failure - BiPAP has help mental status- may need to continue CPAP at night for OSA-   Acute renal failure  Baseline GFR ~55 as of Jan 2014 - GFR at admit ~45  - hydration resulted in further respiratory distress and therefore diuretics were started  - Cr rising - follow - will cut back on Lasix today  Hyperkalemia K more elevated today - Recheck now-   COPD No evidence of acute bronchospasm  Dementia   Chronic diastolic CHF  EF normal via TTE May 2013  DM CBG modestly elevated - follow without change in treatment today given n.p.o.  status  HTN  Hypothyroid  HLD  Anxiety D/O  Code Status: NO CODE Family Communication: discussed with daughter today Disposition Plan: Remain in step down unit due to respiratory distress and possible need for BiPAP- NOTE- daughter does not want to rush her discharge and will pay cash if insurance refuses to pay  Consultants: none  Procedures: none  Antibiotics: Zosyn 1/13 >> Vancomycin 1/13 >>  DVT prophylaxis: SQ heparin  Objective: Blood pressure 139/61, pulse 105, temperature 98.3 F (36.8 C), temperature source Oral, resp. rate 35, height 5\' 4"  (1.626 m), weight 78.4 kg (172 lb 13.5 oz), SpO2 96.00%.  Intake/Output Summary (Last 24 hours) at 04/07/13 1307 Last data filed at 04/07/13 1242  Gross per 24 hour  Intake    925 ml  Output    625 ml  Net    300 ml   Exam: General: Alert, not following commands - speech nonsensical - mildTachypnea Lungs: Poor air movement in left base - tachypnea - shallow respirations- 4L NS 96% Cardiovascular: Tachycardic but regular without gallop or appreciable murmur Abdomen: Nontender, nondistended, soft, bowel sounds positive, no rebound, no ascites, no appreciable mass Extremities: No significant cyanosis, clubbing, or edema bilateral lower extremities  Data Reviewed: Basic Metabolic Panel:  Recent Labs Lab 03-18-14 1818 04/06/13 0027 04/06/13 0322 04/06/13 1154 04/07/13 0338  NA 137  --  137 141 140  K 5.6*  --  5.9* 5.6* 6.1*  CL 94*  --  91* 95*  94*  CO2 31  --  34* 38* 35*  GLUCOSE 255*  --  216* 207* 232*  BUN 13  --  15 16 23   CREATININE 1.12* 1.12* 1.11* 1.28* 1.76*  CALCIUM 8.8  --  8.9 8.9 8.8   Liver Function Tests:  Recent Labs Lab April 07, 2013 1818  AST 16  ALT 12  ALKPHOS 68  BILITOT 0.3  PROT 6.5  ALBUMIN 3.3*   CBC:  Recent Labs Lab April 07, 2013 1818 04/06/13 0027 04/06/13 0322 04/07/13 0338  WBC 16.9* 13.6* 15.1* 12.9*  NEUTROABS 14.6*  --  12.6*  --   HGB 12.4 11.8* 12.3 11.8*  HCT  40.4 38.7 40.1 39.7  MCV 100.0 100.3* 99.0 101.5*  PLT 168 190 212 195   Cardiac Enzymes:  Recent Labs Lab April 07, 2013 1833  TROPONINI <0.30   BNP (last 3 results)  Recent Labs  04/07/13 1833  PROBNP 6048.0*   CBG:  Recent Labs Lab 04/07/13 0032 04/07/13 0416 04/07/13 0437 04/07/13 0809 04/07/13 1243  GLUCAP 249* 209* 249* 187* 174*    Recent Results (from the past 240 hour(s))  CULTURE, BLOOD (ROUTINE X 2)     Status: None   Collection Time    Apr 07, 2013  8:52 PM      Result Value Range Status   Specimen Description BLOOD HAND LEFT   Final   Special Requests BOTTLES DRAWN AEROBIC ONLY 3CC   Final   Culture  Setup Time     Final   Value: 04/06/2013 02:14     Performed at [facility]   Culture     Final   Value:        BLOOD CULTURE RECEIVED NO GROWTH TO DATE CULTURE WILL BE HELD FOR 5 DAYS BEFORE ISSUING A FINAL NEGATIVE REPORT     Performed at [facility]   Report Status PENDING   Incomplete  CULTURE, BLOOD (ROUTINE X 2)     Status: None   Collection Time    Apr 07, 2013  8:59 PM      Result Value Range Status   Specimen Description BLOOD HAND RIGHT   Final   Special Requests BOTTLES DRAWN AEROBIC ONLY 3CC   Final   Culture  Setup Time     Final   Value: 04/06/2013 02:14     Performed at [facility]   Culture     Final   Value:        BLOOD CULTURE RECEIVED NO GROWTH TO DATE CULTURE WILL BE HELD FOR 5 DAYS BEFORE ISSUING A FINAL NEGATIVE REPORT     Performed at [facility]   Report Status PENDING   Incomplete  MRSA PCR SCREENING     Status: None   Collection Time    07-Apr-2013 10:52 PM      Result Value Range Status   MRSA by PCR NEGATIVE  NEGATIVE Final   Comment:            The GeneXpert MRSA Assay (FDA     approved for NASAL specimens     only), is one component of a     comprehensive MRSA colonization     surveillance program. It is not     intended to diagnose MRSA     infection nor to guide or     monitor treatment  for     MRSA infections.  CULTURE, BLOOD (ROUTINE X 2)     Status: None   Collection Time  04/06/13 12:15 AM      Result Value Range Status   Specimen Description BLOOD LEFT ARM   Final   Special Requests BOTTLES DRAWN AEROBIC ONLY [facility]   Final   Culture  Setup Time     Final   Value: 04/06/2013 12:14     Performed at [facility]   Culture     Final   Value:        BLOOD CULTURE RECEIVED NO GROWTH TO DATE CULTURE WILL BE HELD FOR 5 DAYS BEFORE ISSUING A FINAL NEGATIVE REPORT     Performed at [facility]   Report Status PENDING   Incomplete  CULTURE, BLOOD (ROUTINE X 2)     Status: None   Collection Time    04/06/13 12:27 AM      Result Value Range Status   Specimen Description BLOOD LEFT WRIST   Final   Special Requests BOTTLES DRAWN AEROBIC ONLY 8CC   Final   Culture  Setup Time     Final   Value: 04/06/2013 12:14     Performed at [facility]   Culture     Final   Value:        BLOOD CULTURE RECEIVED NO GROWTH TO DATE CULTURE WILL BE HELD FOR 5 DAYS BEFORE ISSUING A FINAL NEGATIVE REPORT     Performed at [facility]   Report Status PENDING   Incomplete  URINE CULTURE     Status: None   Collection Time    04/06/13  8:52 AM      Result Value Range Status   Specimen Description URINE, CATHETERIZED   Final   Special Requests NONE   Final   Culture  Setup Time     Final   Value: 04/06/2013 09:15     Performed at [facility] Count     Final   Value: NO GROWTH     Performed at [facility]   Culture     Final   Value: NO GROWTH     Performed at [facility]   Report Status 04/07/2013 FINAL   Final     Studies:  Recent x-ray studies have been reviewed in detail by the Attending Physician  Scheduled Meds:  Scheduled Meds: . furosemide  80 mg Intravenous Q12H  . insulin aspart  0-9 Units Subcutaneous Q4H  . levothyroxine  50 mcg Intravenous Daily  . piperacillin-tazobactam (ZOSYN)  IV  2.25 g  Intravenous Q6H  . [START ON 04/11/2013] vancomycin  1,000 mg Intravenous Q48H    Time spent on care of this patient: 35 mins   Calvert Cantor, MD  [facility] Office  [phone] Pager - Text Page per Loretha Stapler as per below:  On-Call/Text Page:      [email or web address]      password [email or web address]  If 7PM-7AM, please contact night-coverage [email or web address] Password [email or web address] 04/07/2013, 1:07 PM   LOS: 2 days

## 2013-04-07 NOTE — Clinical Documentation Improvement (Signed)
Clinical Indicators:  "Tachypneic, Respiratory Effort appears insufficient with shallow breaths, Patient is in clear respiratory distress at the present time...Marland Kitchen.Marland Kitchen." documented in current medical record.  Initial ABG showing - pH 7.12;  PC02 0.0 (unreportable range);  Bicarb 36.8 on nasal cannula prior to Bipap  Post-Bipap ABGs - pH 7.22;   PC02 97.1;  Bicarb 39.1                                 PH 7.31;  PC02 75.1;  Bicarb 37.1  Possible Conditions:   - Acute Hypercarbic Respiratory Failure   - Other Condition   - Unable to clinically Determine   Thank You, Jerral Ralphathy R Tyresa Prindiville ,RN BSN CCDS Certified Clinical Documentation Specialist:  [phone] [facility]- Health Information Management

## 2013-04-07 NOTE — Progress Notes (Signed)
Clinical Social Work Department BRIEF PSYCHOSOCIAL ASSESSMENT 04/07/2013  Patient:  [name],[name]     Account Number:  [id number]     Admit date:  2014-02-09  Clinical Social Worker:  Roberta BilesANDERSON,[name], LCSWA  Date/Time:  04/07/2013 09:05 AM  Referred by:  Physician  Date Referred:  04/07/2013 Referred for  SNF Placement   Other Referral:   Interview type:  Family Other interview type:    PSYCHOSOCIAL DATA Living Status:  FACILITY [name] Level of care:  Skilled Nursing Facility Primary support name:  Criss AlvineMinnie [name] [name] ([phone]) Primary support relationship to patient:  CHILD, ADULT Degree of support available:   Good--[facility], and her daughter is involved in her care.    CURRENT CONCERNS Current Concerns  Post-Acute Placement   Other Concerns:    SOCIAL WORK ASSESSMENT / PLAN CSW called pt's daughter, as pt was disoriented x3 according to her chart. CSW explained CSW role to daughter [name], and [name] expressed understanding of CSW role and that the pt has been to the hospital before and she understands discharge back to SNF. CSW asked [name] about her opinion of the facility, and [name] explained "it's alright," they have had issues with the SNF in the past but that [name] has worked through these with the facility and she actively provides her input to the facility. CSW explained that CSW can arrange for pt to return to SNF, and that CSW wanted to check with [name] before doing this to be sure [name] would like the pt to return to this SNF--Roberta Alvino ChapelJo states that she would, and thanked CSW for assistance. [name] explains pt is private pay, so they hold her bed and she can go back when medically ready. CSW will contact [name] when pt's discharge is imminent.   Assessment/plan status:  Psychosocial Support/Ongoing Assessment of Needs Other assessment/ plan:    Information/referral to community resources:   SNF [facility]    PATIENT'S/FAMILY'S RESPONSE TO PLAN OF CARE: Good--pt's daughter [name] engaged in conversation with CSW and spoke about [facility]. [name] understands CSW role, as pt has been in the hospital before and [facility]. [name] thanked CSW for her help.      Roberta LabradorJulie Aurther [name], MSW, [facility]WA Clinical Social Worker [phone]

## 2013-04-07 NOTE — Progress Notes (Signed)
ANTIBIOTIC CONSULT NOTE - Follow-up  Pharmacy Consult for vancomycin and Zosyn Indication: HCAP  No Known Allergies  Patient Measurements: Height: 5\' 4"  (162.6 cm) Weight: 172 lb 13.5 oz (78.4 kg) IBW/kg (Calculated) : 54.7 Adjusted Body Weight: Estimated weight - 75kg  Vital Signs: Temp: 98.5 F (36.9 C) (01/15 0436) Temp src: Oral (01/15 0436) BP: 141/68 mmHg (01/15 0816) Pulse Rate: 100 (01/15 0435) Intake/Output from previous day: 01/14 0701 - 01/15 0700 In: 1490 [I.V.:1140; IV Piggyback:350] Out: 225 [Urine:225] Intake/Output from this shift: Total I/O In: -  Out: 225 [Urine:225]  Labs:  Recent Labs  04/06/13 0027 04/06/13 0322 04/06/13 1154 04/07/13 0338  WBC 13.6* 15.1*  --  12.9*  HGB 11.8* 12.3  --  11.8*  PLT 190 212  --  195  CREATININE 1.12* 1.11* 1.28* 1.76*   Estimated Creatinine Clearance: 20.7 ml/min (by C-G formula based on Cr of 1.76). No results found for this basename: VANCOTROUGH, Leodis Binet, VANCORANDOM, GENTTROUGH, GENTPEAK, GENTRANDOM, TOBRATROUGH, TOBRAPEAK, TOBRARND, AMIKACINPEAK, AMIKACINTROU, AMIKACIN,  in the last 72 hours   Microbiology: Recent Results (from the past 720 hour(s))  CULTURE, BLOOD (ROUTINE X 2)     Status: None   Collection Time    03/25/2013  8:52 PM      Result Value Range Status   Specimen Description BLOOD HAND LEFT   Final   Special Requests BOTTLES DRAWN AEROBIC ONLY 3CC   Final   Culture  Setup Time     Final   Value: 04/06/2013 02:14     Performed at [facility]   Culture     Final   Value:        BLOOD CULTURE RECEIVED NO GROWTH TO DATE CULTURE WILL BE HELD FOR 5 DAYS BEFORE ISSUING A FINAL NEGATIVE REPORT     Performed at [facility]   Report Status PENDING   Incomplete  CULTURE, BLOOD (ROUTINE X 2)     Status: None   Collection Time    04/14/2013  8:59 PM      Result Value Range Status   Specimen Description BLOOD HAND RIGHT   Final   Special Requests BOTTLES DRAWN AEROBIC ONLY 3CC    Final   Culture  Setup Time     Final   Value: 04/06/2013 02:14     Performed at [facility]   Culture     Final   Value:        BLOOD CULTURE RECEIVED NO GROWTH TO DATE CULTURE WILL BE HELD FOR 5 DAYS BEFORE ISSUING A FINAL NEGATIVE REPORT     Performed at [facility]   Report Status PENDING   Incomplete  MRSA PCR SCREENING     Status: None   Collection Time    04/07/2013 10:52 PM      Result Value Range Status   MRSA by PCR NEGATIVE  NEGATIVE Final   Comment:            The GeneXpert MRSA Assay (FDA     approved for NASAL specimens     only), is one component of a     comprehensive MRSA colonization     surveillance program. It is not     intended to diagnose MRSA     infection nor to guide or     monitor treatment for     MRSA infections.  CULTURE, BLOOD (ROUTINE X 2)     Status: None   Collection Time  04/06/13 12:15 AM      Result Value Range Status   Specimen Description BLOOD LEFT ARM   Final   Special Requests BOTTLES DRAWN AEROBIC ONLY [facility]   Final   Culture  Setup Time     Final   Value: 04/06/2013 12:14     [facility]formed at [facility]   Culture     Final   Value:        BLOOD CULTURE RECEIVED NO GROWTH TO DATE CULTURE WILL BE HELD FOR 5 DAYS BEFORE ISSUING A FINAL NEGATIVE REPORT     Performed at [facility]   Report Status PENDING   Incomplete  CULTURE, BLOOD (ROUTINE X 2)     Status: None   Collection Time    04/06/13 12:27 AM      Result Value Range Status   Specimen Description BLOOD LEFT WRIST   Final   Special Requests BOTTLES DRAWN AEROBIC ONLY 8CC   Final   Culture  Setup Time     Final   Value: 04/06/2013 12:14     [facility]formed at [facility]   Culture     Final   Value:        BLOOD CULTURE RECEIVED NO GROWTH TO DATE CULTURE WILL BE HELD FOR 5 DAYS BEFORE ISSUING A FINAL NEGATIVE REPORT     Performed at [facility]   Report Status PENDING   Incomplete  URINE CULTURE     Status: None   Collection Time     04/06/13  8:52 AM      Result Value Range Status   Specimen Description URINE, CATHETERIZED   Final   Special Requests NONE   Final   Culture  Setup Time     Final   Value: 04/06/2013 09:15     [facility]formed at [facility]   Colony Count     Final   Value: NO GROWTH     Performed at [facility]   Culture     Final   Value: NO GROWTH     Performed at [facility]   Report Status 04/07/2013 FINAL   Final    Assessment: [age] yof presented from SNF with AMS. Started empirically on zosyn and vancomycin. Pt is afebrile and WBC is elevated at 12.9. Of note, pts Scr jumped significantly up to 1.76. Also with poor UOP.   Vanc 1/13>> Zosyn 1/13>> Tamiflu 1/13>>1/14  1/14 Blood - NGTD 1/13 Blood - NGTD 1/14 Urine - NEG 1/14 MRSA - NEG  Goal of Therapy:  Vancomycin trough level 15-20 mcg/ml  Plan:  1. Change vancomycin to 1gm IV Q48H 2. Change zosyn to 2.25gm IV Q6H 3. F/u renal fxn, C&S, clinical status and [facility]  Travia Onstad, PharmD, BCPS Pager # [phone] 04/07/2013 9:41 AM

## 2013-04-08 DIAGNOSIS — J189 Pneumonia, unspecified organism: Secondary | ICD-10-CM

## 2013-04-08 DIAGNOSIS — J449 Chronic obstructive pulmonary disease, unspecified: Secondary | ICD-10-CM

## 2013-04-08 DIAGNOSIS — I2510 Atherosclerotic heart disease of native coronary artery without angina pectoris: Secondary | ICD-10-CM

## 2013-04-08 DIAGNOSIS — I5032 Chronic diastolic (congestive) heart failure: Secondary | ICD-10-CM

## 2013-04-08 DIAGNOSIS — I509 Heart failure, unspecified: Secondary | ICD-10-CM

## 2013-04-08 DIAGNOSIS — I10 Essential (primary) hypertension: Secondary | ICD-10-CM

## 2013-04-08 LAB — BASIC METABOLIC PANEL
BUN: 39 mg/dL — ABNORMAL HIGH (ref 6–23)
CO2: 29 mEq/L (ref 19–32)
Calcium: 8.7 mg/dL (ref 8.4–10.5)
Chloride: 93 mEq/L — ABNORMAL LOW (ref 96–112)
Creatinine, Ser: 2.34 mg/dL — ABNORMAL HIGH (ref 0.50–1.10)
GFR, EST AFRICAN AMERICAN: 20 mL/min — AB (ref >90)
GFR, EST NON AFRICAN AMERICAN: 17 mL/min — AB (ref >90)
Glucose, Bld: 299 mg/dL — ABNORMAL HIGH (ref 70–99)
Potassium: 6.3 mEq/L — ABNORMAL HIGH (ref 3.7–5.3)
Sodium: 140 mEq/L (ref 137–147)

## 2013-04-08 LAB — GLUCOSE, CAPILLARY
Glucose-Capillary: 303 mg/dL — ABNORMAL HIGH (ref 70–99)
Glucose-Capillary: 189 mg/dL — ABNORMAL HIGH (ref 70–99)

## 2013-04-08 LAB — CBC
HCT: 38.4 % (ref 36.0–46.0)
HEMOGLOBIN: 11.9 g/dL — AB (ref 12.0–15.0)
MCH: 31.2 pg (ref 26.0–34.0)
MCHC: 31 g/dL (ref 30.0–36.0)
MCV: 100.8 fL — ABNORMAL HIGH (ref 78.0–100.0)
Platelets: ADEQUATE 10*3/uL (ref 150–400)
RBC: 3.81 MIL/uL — ABNORMAL LOW (ref 3.87–5.11)
RDW: 14.3 % (ref 11.5–15.5)
WBC: 17 10*3/uL — ABNORMAL HIGH (ref 4.0–10.5)

## 2013-04-08 MED ORDER — SODIUM CHLORIDE 0.9 % IV BOLUS (SEPSIS)
250.0000 mL | Freq: Once | INTRAVENOUS | Status: AC
  Administered 2013-04-08: 250 mL via INTRAVENOUS

## 2013-04-12 LAB — CULTURE, BLOOD (ROUTINE X 2)
CULTURE: NO GROWTH
Culture: NO GROWTH
Culture: NO GROWTH
Culture: NO GROWTH

## 2013-04-24 NOTE — Discharge Summary (Signed)
Death Summary  Roberta RiffleMildred R [name] MVH:[id number] DOB: 06/13/1918 DOA: May 21, 2013  PCP: Colon BranchQURESHI, AYYAZ, MD   Admit date: May 21, 2013 Date of Death: 04/23/2013  Final Diagnoses:  Principal Problem:   Sepsis Active Problems:   PNA (pneumonia)   Metabolic encephalopathy   COPD (chronic obstructive pulmonary disease)   CAD (coronary artery disease)   Diastolic CHF, chronic-grade I   Hypothermia   History of present illness:  80 yo female w/ a h/o diastolic chf and copd who was transported from her snf for somnolence/ams. Pt had her xanax dosing doubled in the 2 days prior to her admit. She was treated for pneumonia (HCAP) and fluid overload. She was noted to develop hypercarbia on the 1/14 and required a BiPAP which helped resolve the lethargy. The following day, we were able to wean her off of the BiPAP to a nasal cannula. She was quite alert and an SLP eval was ordered prior to starting feeding. However, the daughter was frustrated in regards to waiting for a proper speech evaluation. She did give her mother some water at the bedside herself (no staff was present at the time) and stated she did not note any aspiration. I advised her regarding silent aspiration and recurrent respiratory failure however if we allowed her to eat without a proper SLP eval.  she insisted upon a diet being initiated. I placed the patient on a dysphagia diet with thickened liquids to decrease the chance of aspiration and again advised the daughter about the persistent risk of aspiration even with this diet.  It is noted that patient developed a-fib with RVR over night and was started on a Cardizem infusion.  I entered the room at 9 AM to evaluate her and noted pulse ox to be in low 80s. The RN had just checked on her about 10 min before and mentioned that the patient was stable. Her BP had dropped into the 80s and we had given hr a 250 cc bolus to help correct it. Cardizem infusion was cut back and then discontinued. I noted  upon arrival into the room that the patient's HR  began to drop to the 30s and then rapidly into asystole. The patient expired at 9:20 AM. I spoke with her daughter in person at 4510 AM to inform her and answer questions.    Hospital Course:  Sepsis (hypothermia/tachycardia/WBC 16)  Flu negative - UA not c/w UTI - CXR not definitive, but raised question of LLL infiltrate   LLL HCAP v/s Aspiration PNA  - improved and was able to come off BiPAP on 1/15 - SLP eval was pending but the patient's daughter was frustrated in regards to her being NPO and asked for a diet - started mechanical soft with nectar thick liquids with the knowledge that she was still a high risk for aspiration   Obtunded/ acute hypercarbic resp failure  - decreased alertness noted on 1/4- ABG revealed hypercarbia and therefore patient was initiated on a BiPAP which corrected the lethargy  Acute on Chronic diastolic CHF  Acute renal failure   Hyperkalemia  COPD  Dementia  DM  HTN  Hypothyroid  HLD  Anxiety D/O       Time: 55 min  Signed:  Calvert CantorIZWAN,Pihu Basil, MD  [facility] 03/31/2013, 10:30 AM

## 2013-04-24 NOTE — Progress Notes (Signed)
Notified by CCMD that pt had a rate of 30.. Dr. Butler Denmarkizwan at the bedside. Pt. With agonal breathing; color pale. Pt. DNR.

## 2013-04-24 NOTE — Progress Notes (Signed)
Family in to see. 

## 2013-04-24 NOTE — Progress Notes (Signed)
SLP Cancellation Note  Patient Details Name: Roberta RiffleMildred R [name] MRN: [id number] DOB: 04/14/1918   Cancelled treatment:       Reason Eval/Treat Not Completed: Patient's level of consciousness. Pt not appropriate for swallow assessment this am. Will try in pm if possible. RN will hold breakfast try until arousal improves.   Harlon DittyBonnie Dareion Kneece, [place] CCC-SLP [phone]  [name], [name] 04/01/2013, 8:33 AM

## 2013-04-24 NOTE — Progress Notes (Signed)
At bedside..sliding scale insulin given; NS 250ml bolus started and abx hung. Pt. Unresponsive. Eyes closed. Hr 81 pulse ox 1 00% at 5 liters per n/c/ cardizem drip at 5mg /hr infusing. Speec in to see for  Swallow eval and will follow up with later.

## 2013-04-24 NOTE — Progress Notes (Signed)
RN informed CSW that pt died today. CS[facility] that pt was residing in before hospital admission. CSW provided condolences to SNF administrator and is signing off.    Maryclare LabradorJulie Zaia Carre, MSW, [facility]WA Clinical Social Worker [phone]

## 2013-04-24 NOTE — Progress Notes (Signed)
Body taken to the morgue

## 2013-04-24 NOTE — Progress Notes (Addendum)
09:04 Requested to bedside by MD Rizwan. Pt unresponsive, pale with agonal breathing and HR of 35 and Spo2 of 84%. 500 cc bolus ordered by MD Rizwan and initiated on IV Pump. NRB mask obtained and placed on pt at 15L. Pt proceeded into asystole.

## 2013-04-24 NOTE — Progress Notes (Signed)
CBGs on 1/15;  [phone] mg/dl       1/611/16: [phone] mg/dl Recommend starting Lantus 18 units daily if CBGs continue greater than 180 mg/dl.  Patient takes Lantus 36 units every HS at home. Continue Novolog SENSITIVE correction scale.  Will continue to follow while in hospital. Smith MinceKendra Pallett RN BSN CDE

## 2013-04-24 NOTE — Progress Notes (Signed)
Daughter phoned and made aware that Dr. Butler Denmarkizwan would  Like to speak with her regarding pt. condition.

## 2013-04-24 DEATH — deceased

## 2013-09-10 IMAGING — CR DG ABDOMEN 1V
1 series · 1 of 1 positions shown · non-contrast
Comparison: Two-view abdomen x-ray 06/27/2011.

CLINICAL DATA: Abdominal pain and distention.

ABDOMEN - 1 VIEW

[ap (kub)]
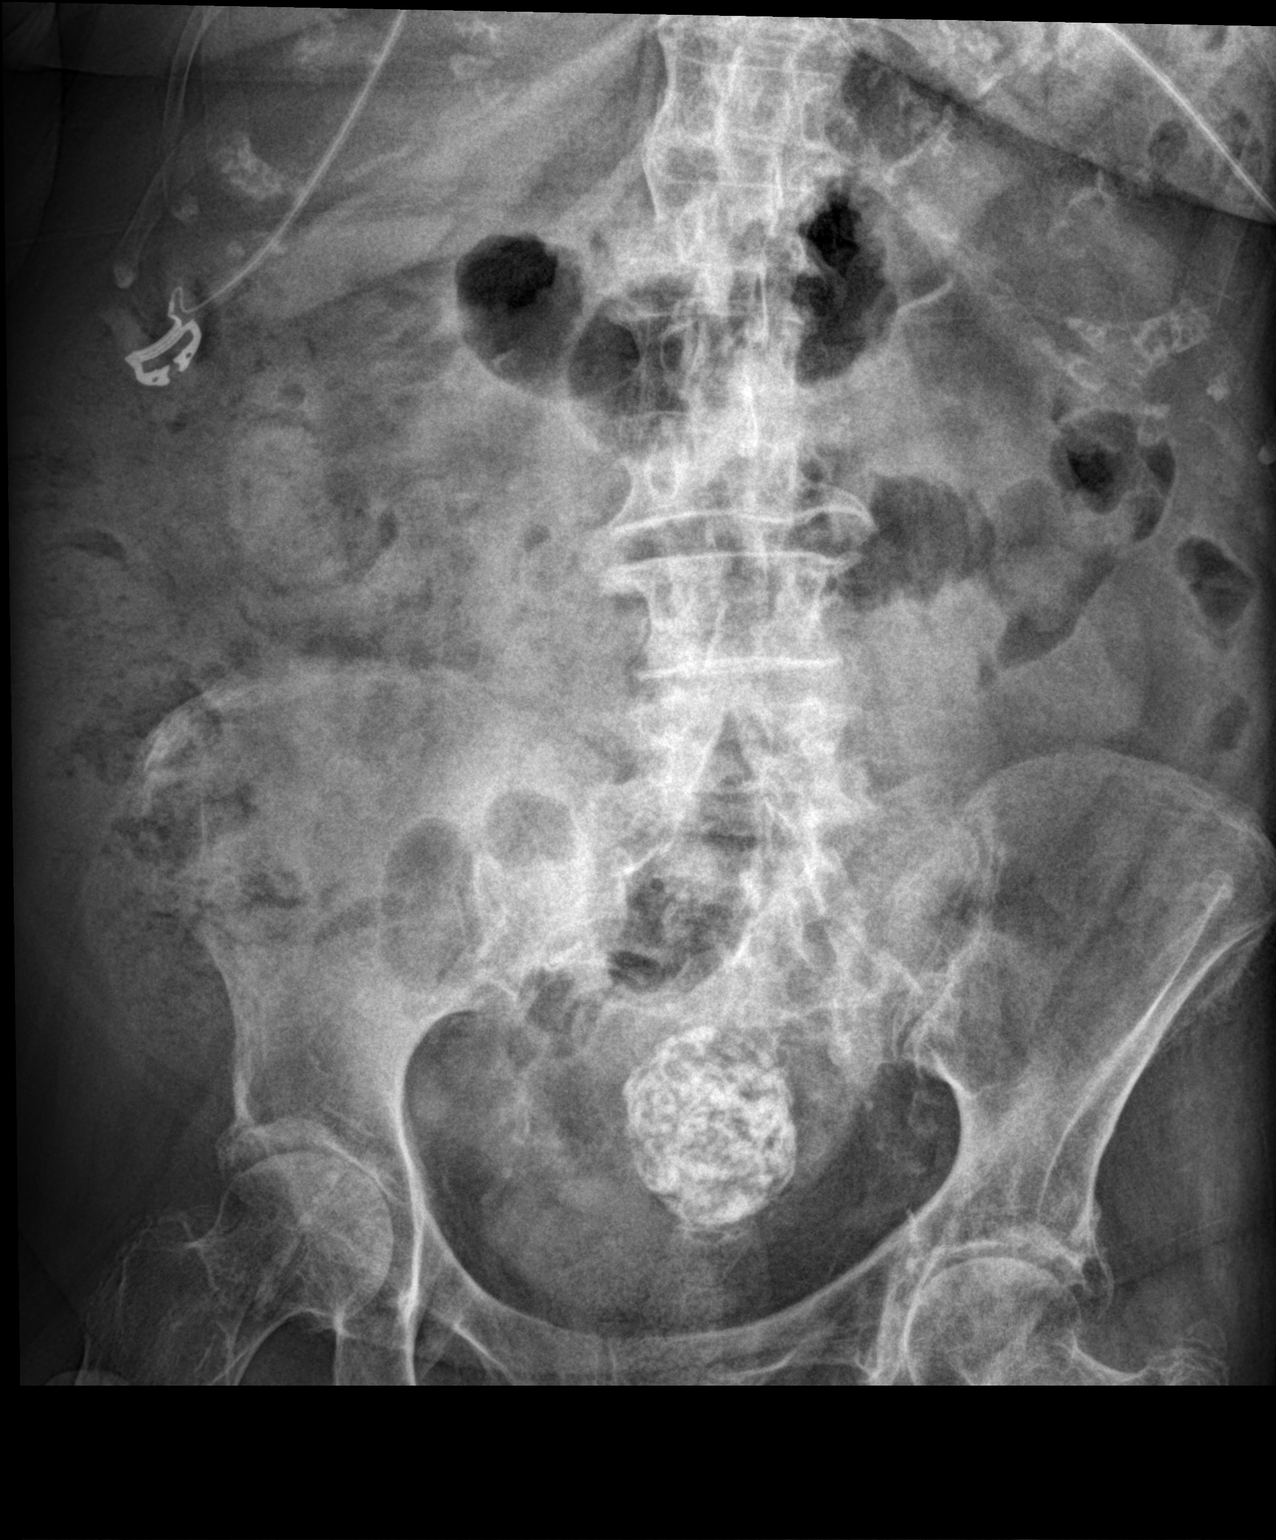

[1 of 1 positions shown; findings below may reference images not displayed]

FINDINGS: Bowel gas pattern unremarkable without evidence of
obstruction or significant ileus.  Very large amount of stool
throughout the colon from cecum to rectum.  Calcified fibroid in
the midline of the pelvis.  Degenerative changes involving the
lumbar spine and both hips.
IMPRESSION: No acute abdominal abnormality.  Large stool burden.

## 2013-09-14 IMAGING — CR DG CHEST 1V
1 series · 1 of 1 positions shown · non-contrast
Comparison: 08/14/2011

CLINICAL DATA: Shortness of breath.

CHEST - 1 VIEW

[AP]
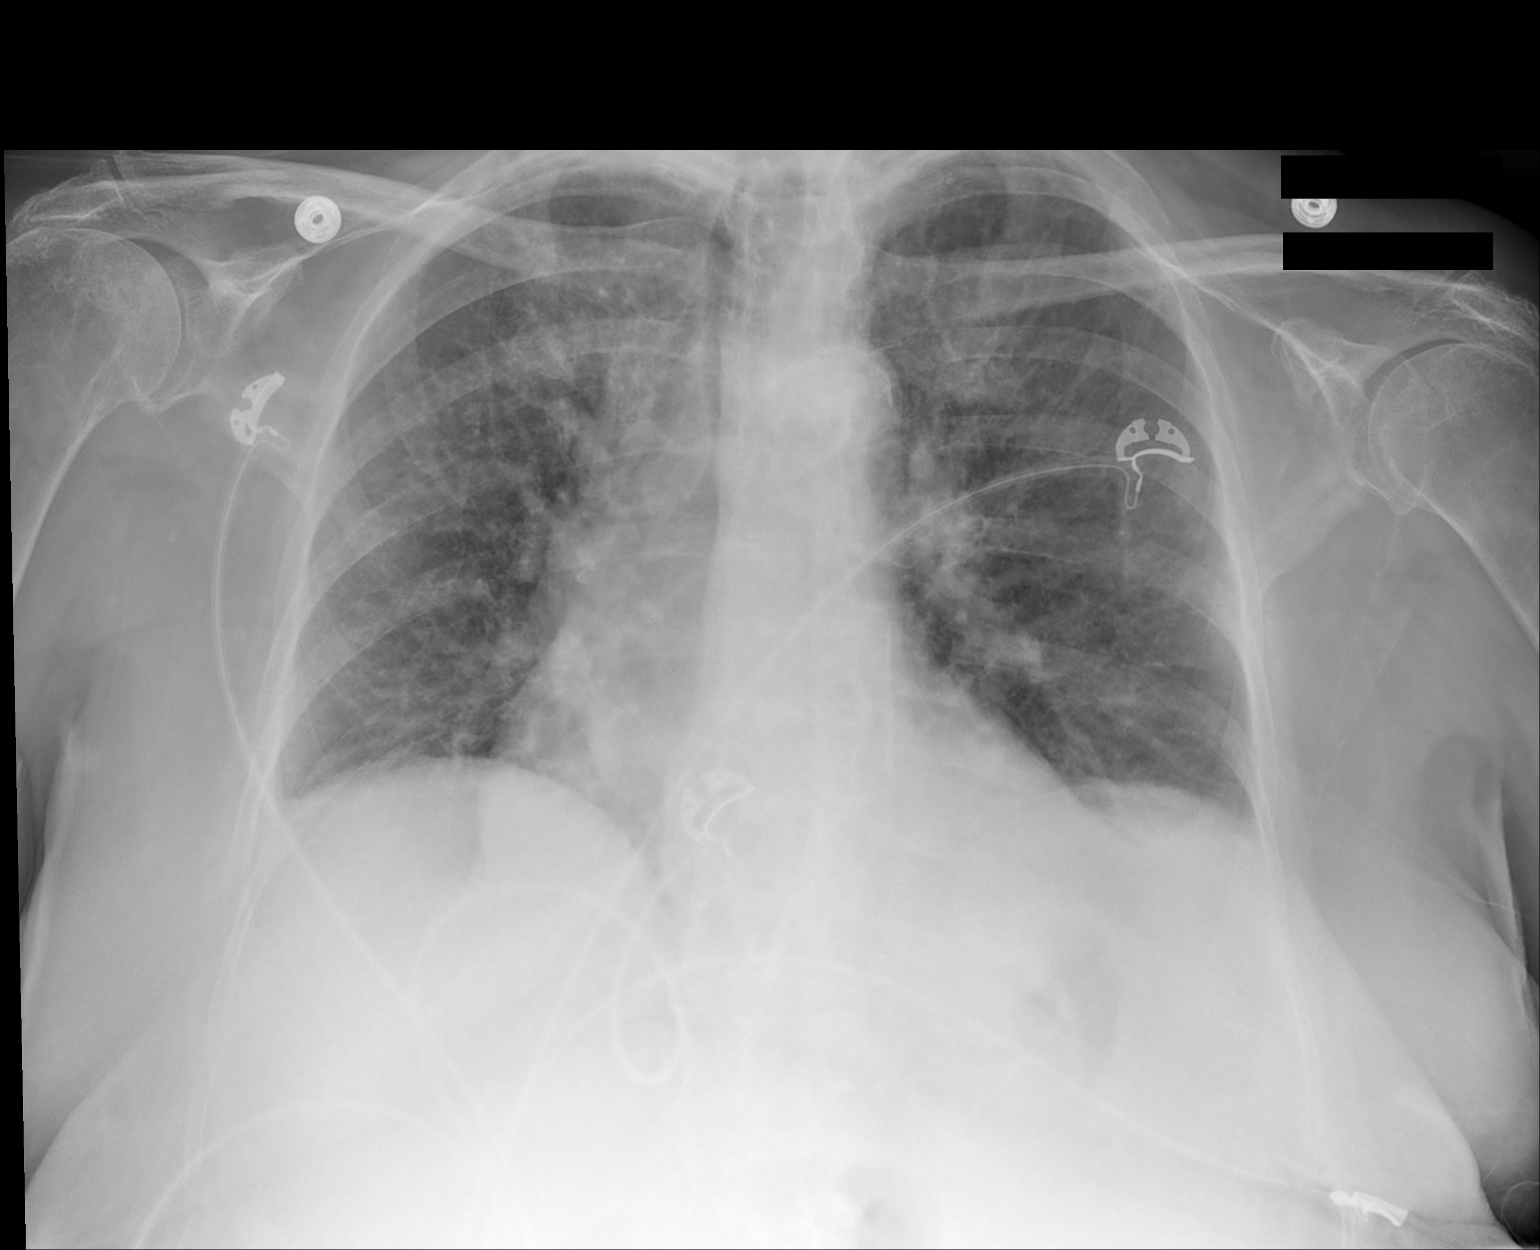

[1 of 1 positions shown; findings below may reference images not displayed]

FINDINGS: The patient has a slightly increased interstitial
accentuation consistent with mild pulmonary edema.  Chronic
cardiomegaly.  Pulmonary vascularity has increased.

No discrete effusions.
IMPRESSION: Increased slight pulmonary edema.

## 2013-10-01 IMAGING — CR DG CHEST 2V
1 series · 1 of 1 positions shown · non-contrast
Comparison: 08/30/2011

CLINICAL DATA: Recurrent hyponatremia.  Rule out aspiration
pneumonia versus malignancy.

CHEST - 2 VIEW

[w chest pa]
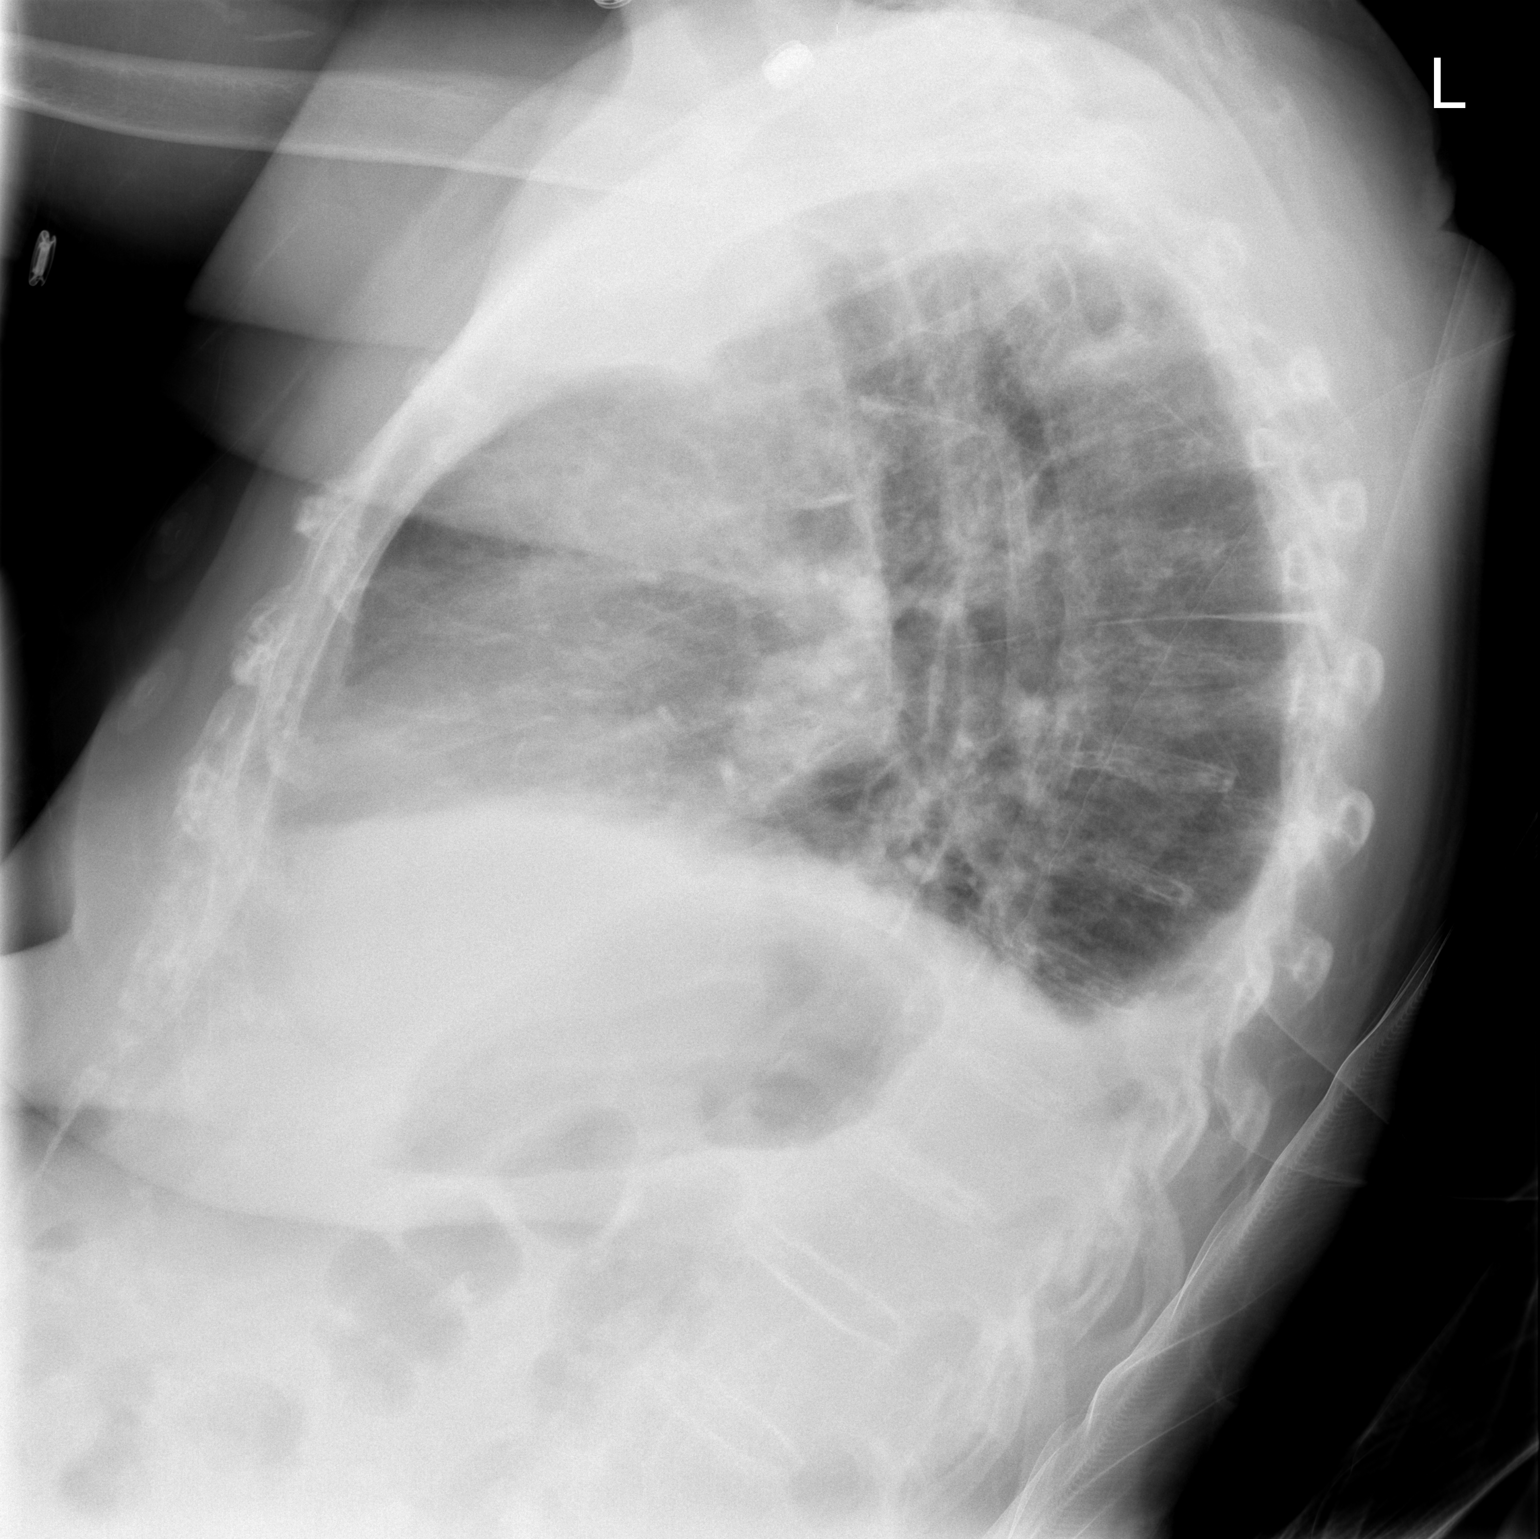

[1 of 1 positions shown; findings below may reference images not displayed]

FINDINGS: Lateral view degraded by patient arm position.

Mild hyperinflation on the lateral view.  Low volumes on the
frontal. Cardiomegaly accentuated by AP portable technique.  Small
bilateral pleural effusions.  The right effusion is likely similar.
The left effusion is new or slightly increased. No pneumothorax.
Mild bibasilar airspace disease is slightly increased.
IMPRESSION: Cardiomegaly with small bilateral pleural effusions and bibasilar
atelectasis.  No evidence of pneumonia or primary malignancy within
the chest.

## 2013-10-19 IMAGING — CR DG CHEST 1V PORT
1 series · 1 of 1 positions shown · non-contrast
Comparison: 09/04/2011

CLINICAL DATA: Altered mental status

PORTABLE CHEST - 1 VIEW

[AP]
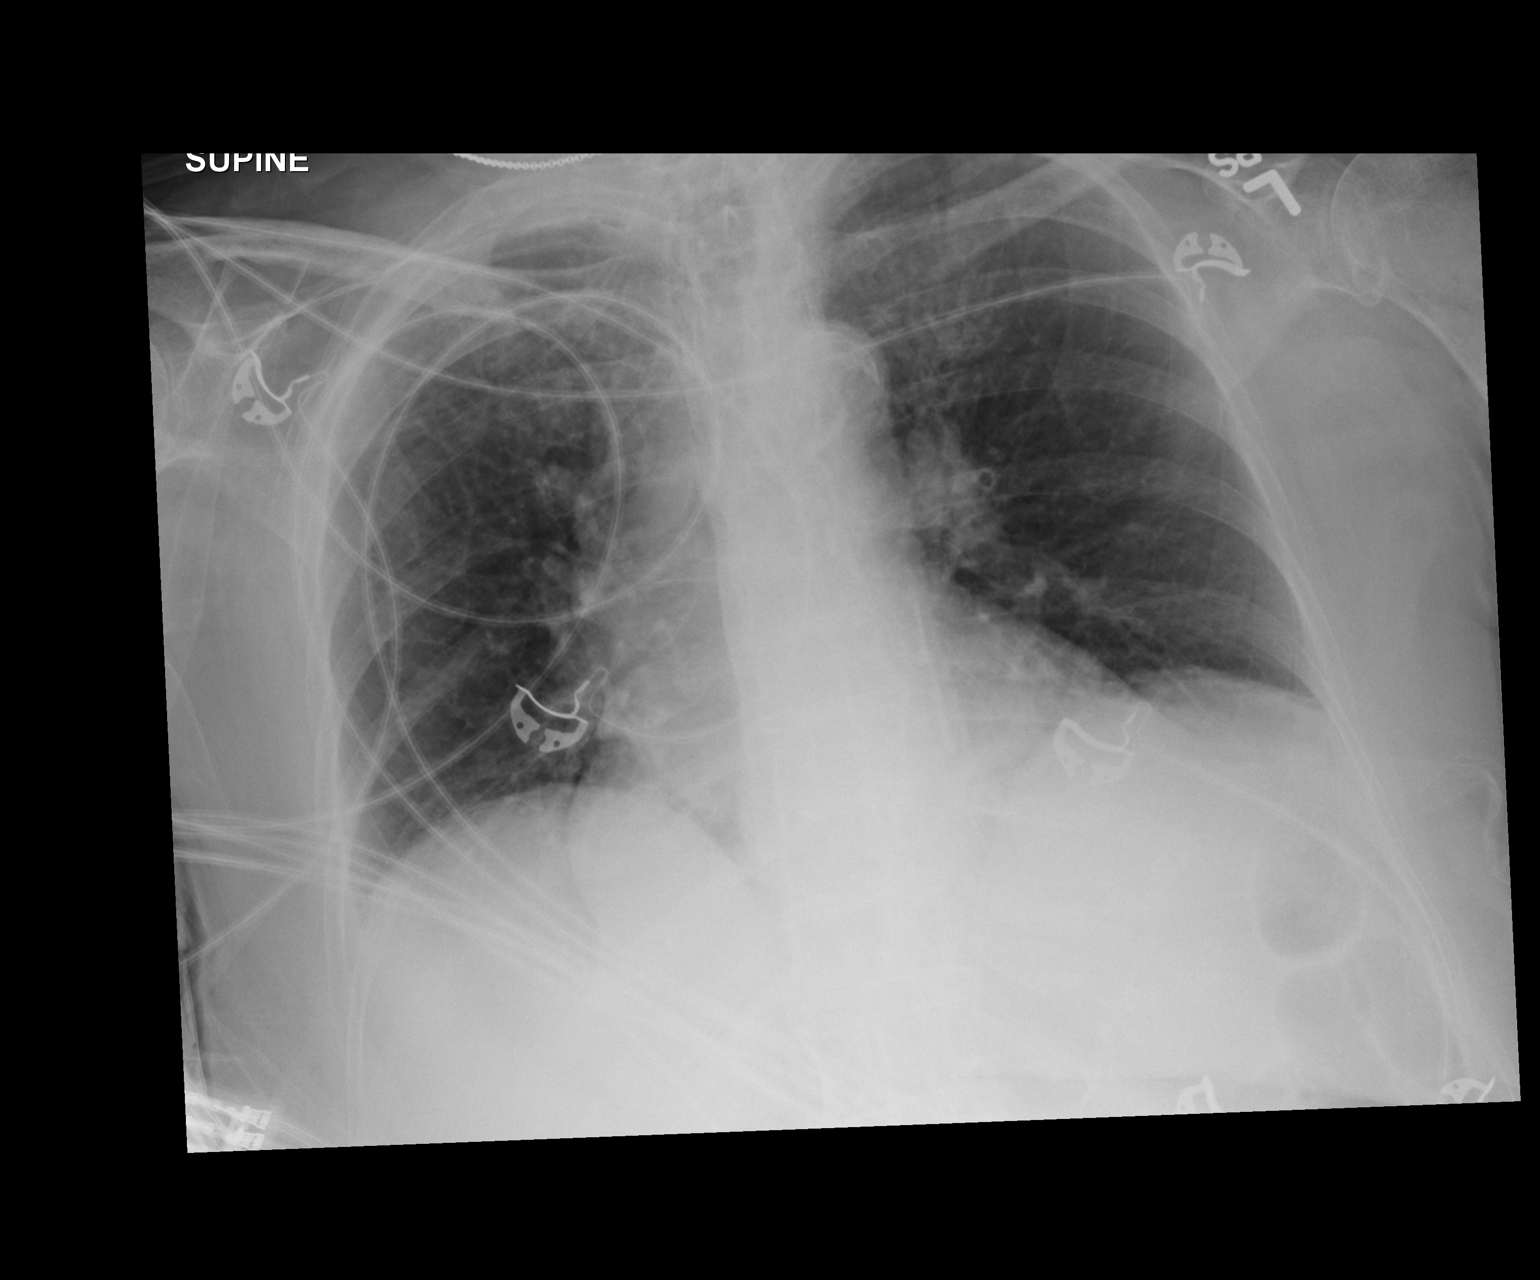

[1 of 1 positions shown; findings below may reference images not displayed]

FINDINGS: Hypoaeration results in interstitial and vascular
crowding.  Cardiomegaly.  Lung bases are obscured by elevated
hemidiaphragms.  Images further degraded by patient rotation.
Interstitial prominence.  No focal consolidation visualized. No
pneumothorax or large pleural effusion.  No acute osseous finding.
IMPRESSION: Degraded by hypoaeration as above.  Cardiomegaly and interstitial
prominence without focal consolidation visualized.

## 2018-03-20 ENCOUNTER — Other Ambulatory Visit
Admission: RE | Admit: 2018-03-20 | Discharge: 2018-03-20 | Disposition: A | Discharge status: Home / Self Care | Payer: Self-pay | Source: Ambulatory Visit | Attending: Internal Medicine | Admitting: Internal Medicine

## 2018-03-20 DIAGNOSIS — R4182 Altered mental status, unspecified: Secondary | ICD-10-CM | POA: Insufficient documentation

## 2018-03-20 DIAGNOSIS — N189 Chronic kidney disease, unspecified: Secondary | ICD-10-CM | POA: Insufficient documentation

## 2018-03-20 LAB — CBC WITH DIFFERENTIAL/PLATELET
Abs Immature Granulocytes: 0.05 10*3/uL (ref 0.00–0.07)
Basophils Absolute: 0.1 10*3/uL (ref 0.0–0.1)
Basophils Relative: 1 %
Eosinophils Absolute: 1.4 10*3/uL — ABNORMAL HIGH (ref 0.0–0.5)
Eosinophils Relative: 13 %
HCT: 31.2 % — ABNORMAL LOW (ref 36.0–46.0)
Hemoglobin: 8.9 g/dL — ABNORMAL LOW (ref 12.0–15.0)
Immature Granulocytes: 1 %
Lymphocytes Relative: 6 %
Lymphs Abs: 0.7 10*3/uL (ref 0.7–4.0)
MCH: 28.2 pg (ref 26.0–34.0)
MCHC: 28.5 g/dL — ABNORMAL LOW (ref 30.0–36.0)
MCV: 98.7 fL (ref 80.0–100.0)
MONO ABS: 1 10*3/uL (ref 0.1–1.0)
Monocytes Relative: 10 %
Neutro Abs: 7.4 10*3/uL (ref 1.7–7.7)
Neutrophils Relative %: 69 %
Platelets: 421 10*3/uL — ABNORMAL HIGH (ref 150–400)
RBC: 3.16 MIL/uL — ABNORMAL LOW (ref 3.87–5.11)
RDW: 20.7 % — AB (ref 11.5–15.5)
WBC: 10.7 10*3/uL — ABNORMAL HIGH (ref 4.0–10.5)
nRBC: 0 % (ref 0.0–0.2)
